# Patient Record
Sex: Female | Born: 1955 | Race: Black or African American | Hispanic: No | Marital: Single | Smoking: Former smoker
Health system: Southern US, Community
[De-identification: ages and names within clinical notes are randomized; demographics above are authoritative.]

## PROBLEM LIST (undated history)

## (undated) DIAGNOSIS — K635 Polyp of colon: Secondary | ICD-10-CM

## (undated) DIAGNOSIS — R112 Nausea with vomiting, unspecified: Secondary | ICD-10-CM

## (undated) DIAGNOSIS — T4145XA Adverse effect of unspecified anesthetic, initial encounter: Secondary | ICD-10-CM

## (undated) DIAGNOSIS — T8859XA Other complications of anesthesia, initial encounter: Secondary | ICD-10-CM

## (undated) DIAGNOSIS — D509 Iron deficiency anemia, unspecified: Secondary | ICD-10-CM

## (undated) DIAGNOSIS — D259 Leiomyoma of uterus, unspecified: Secondary | ICD-10-CM

## (undated) DIAGNOSIS — I10 Essential (primary) hypertension: Secondary | ICD-10-CM

## (undated) DIAGNOSIS — Z9889 Other specified postprocedural states: Secondary | ICD-10-CM

## (undated) HISTORY — DX: Iron deficiency anemia, unspecified: D50.9

## (undated) HISTORY — PX: TUBAL LIGATION: SHX77

## (undated) HISTORY — DX: Polyp of colon: K63.5

## (undated) HISTORY — PX: ABDOMINAL HYSTERECTOMY: SHX81

## (undated) HISTORY — DX: Leiomyoma of uterus, unspecified: D25.9

---

## 2004-09-24 ENCOUNTER — Ambulatory Visit: Payer: Self-pay | Admitting: Family Medicine

## 2004-10-05 ENCOUNTER — Ambulatory Visit: Payer: Self-pay | Admitting: Family Medicine

## 2004-10-19 ENCOUNTER — Ambulatory Visit: Payer: Self-pay | Admitting: Family Medicine

## 2004-11-09 ENCOUNTER — Other Ambulatory Visit: Admission: RE | Admit: 2004-11-09 | Discharge: 2004-11-09 | Payer: Self-pay | Admitting: Family Medicine

## 2004-11-09 ENCOUNTER — Ambulatory Visit: Payer: Self-pay | Admitting: Family Medicine

## 2005-01-08 ENCOUNTER — Ambulatory Visit: Payer: Self-pay | Admitting: Family Medicine

## 2005-03-08 ENCOUNTER — Ambulatory Visit: Payer: Self-pay | Admitting: Family Medicine

## 2005-05-01 HISTORY — PX: CARDIAC CATHETERIZATION: SHX172

## 2005-05-20 ENCOUNTER — Other Ambulatory Visit: Payer: Self-pay

## 2005-05-20 ENCOUNTER — Observation Stay: Payer: Self-pay | Admitting: Internal Medicine

## 2006-01-16 ENCOUNTER — Ambulatory Visit: Payer: Self-pay | Admitting: Family Medicine

## 2006-03-21 ENCOUNTER — Encounter: Payer: Self-pay | Admitting: Family Medicine

## 2006-03-21 ENCOUNTER — Other Ambulatory Visit: Admission: RE | Admit: 2006-03-21 | Discharge: 2006-03-21 | Payer: Self-pay | Admitting: Family Medicine

## 2006-03-21 ENCOUNTER — Ambulatory Visit: Payer: Self-pay | Admitting: Family Medicine

## 2006-05-01 HISTORY — PX: NASAL SINUS SURGERY: SHX719

## 2007-02-13 ENCOUNTER — Encounter: Payer: Self-pay | Admitting: Family Medicine

## 2007-02-13 DIAGNOSIS — J309 Allergic rhinitis, unspecified: Secondary | ICD-10-CM | POA: Insufficient documentation

## 2007-02-13 DIAGNOSIS — J45909 Unspecified asthma, uncomplicated: Secondary | ICD-10-CM | POA: Insufficient documentation

## 2007-02-13 DIAGNOSIS — I10 Essential (primary) hypertension: Secondary | ICD-10-CM | POA: Insufficient documentation

## 2007-03-23 ENCOUNTER — Encounter: Payer: Self-pay | Admitting: Family Medicine

## 2007-03-23 ENCOUNTER — Ambulatory Visit: Payer: Self-pay | Admitting: Family Medicine

## 2007-03-23 ENCOUNTER — Other Ambulatory Visit: Admission: RE | Admit: 2007-03-23 | Discharge: 2007-03-23 | Payer: Self-pay | Admitting: Family Medicine

## 2007-03-25 ENCOUNTER — Encounter: Payer: Self-pay | Admitting: Family Medicine

## 2007-03-25 LAB — CONVERTED CEMR LAB
Bilirubin, Direct: 0.1 mg/dL (ref 0.0–0.3)
CO2: 28 meq/L (ref 19–32)
Cholesterol: 188 mg/dL (ref 0–200)
Creatinine, Ser: 0.7 mg/dL (ref 0.4–1.2)
Glucose, Bld: 85 mg/dL (ref 70–99)
HCT: 29.3 % — ABNORMAL LOW (ref 36.0–46.0)
LDL Cholesterol: 130 mg/dL — ABNORMAL HIGH (ref 0–99)
MCHC: 31.4 g/dL (ref 30.0–36.0)
Monocytes Relative: 2.4 % — ABNORMAL LOW (ref 3.0–11.0)
Neutrophils Relative %: 58.5 % (ref 43.0–77.0)
Platelets: 306 10*3/uL (ref 150–400)
Potassium: 4.1 meq/L (ref 3.5–5.1)
RBC: 4.04 M/uL (ref 3.87–5.11)
RDW: 18.8 % — ABNORMAL HIGH (ref 11.5–14.6)
Sodium: 140 meq/L (ref 135–145)
TSH: 1.16 microintl units/mL (ref 0.35–5.50)
Total Bilirubin: 0.5 mg/dL (ref 0.3–1.2)
Total CHOL/HDL Ratio: 5.5
Total Protein: 6.9 g/dL (ref 6.0–8.3)

## 2007-03-26 ENCOUNTER — Encounter (INDEPENDENT_AMBULATORY_CARE_PROVIDER_SITE_OTHER): Payer: Self-pay | Admitting: *Deleted

## 2007-03-26 LAB — CONVERTED CEMR LAB: Pap Smear: NORMAL

## 2007-03-27 ENCOUNTER — Encounter (INDEPENDENT_AMBULATORY_CARE_PROVIDER_SITE_OTHER): Payer: Self-pay | Admitting: *Deleted

## 2007-03-31 DIAGNOSIS — D259 Leiomyoma of uterus, unspecified: Secondary | ICD-10-CM | POA: Insufficient documentation

## 2007-04-15 ENCOUNTER — Encounter: Payer: Self-pay | Admitting: Family Medicine

## 2007-04-30 ENCOUNTER — Ambulatory Visit: Payer: Self-pay | Admitting: Family Medicine

## 2007-04-30 ENCOUNTER — Encounter: Payer: Self-pay | Admitting: Family Medicine

## 2007-05-05 ENCOUNTER — Encounter (INDEPENDENT_AMBULATORY_CARE_PROVIDER_SITE_OTHER): Payer: Self-pay | Admitting: *Deleted

## 2007-06-01 HISTORY — PX: DILATION AND CURETTAGE OF UTERUS: SHX78

## 2007-06-15 ENCOUNTER — Ambulatory Visit: Payer: Self-pay | Admitting: Unknown Physician Specialty

## 2007-06-30 ENCOUNTER — Ambulatory Visit: Payer: Self-pay | Admitting: Unknown Physician Specialty

## 2007-06-30 ENCOUNTER — Encounter: Payer: Self-pay | Admitting: Family Medicine

## 2008-03-29 ENCOUNTER — Ambulatory Visit: Payer: Self-pay | Admitting: Family Medicine

## 2008-03-31 LAB — CONVERTED CEMR LAB
AST: 17 units/L (ref 0–37)
Albumin: 3.8 g/dL (ref 3.5–5.2)
BUN: 9 mg/dL (ref 6–23)
Basophils Relative: 0.5 % (ref 0.0–3.0)
Chloride: 102 meq/L (ref 96–112)
Creatinine, Ser: 0.7 mg/dL (ref 0.4–1.2)
Direct LDL: 155 mg/dL
Eosinophils Absolute: 0.4 10*3/uL (ref 0.0–0.7)
Eosinophils Relative: 4.6 % (ref 0.0–5.0)
GFR calc non Af Amer: 95 mL/min
Glucose, Bld: 90 mg/dL (ref 70–99)
HCT: 38.8 % (ref 36.0–46.0)
MCV: 88.1 fL (ref 78.0–100.0)
Monocytes Absolute: 0.5 10*3/uL (ref 0.1–1.0)
Monocytes Relative: 6.8 % (ref 3.0–12.0)
Neutrophils Relative %: 56 % (ref 43.0–77.0)
RBC: 4.4 M/uL (ref 3.87–5.11)
Total CHOL/HDL Ratio: 6.7
Total Protein: 6.8 g/dL (ref 6.0–8.3)
WBC: 8 10*3/uL (ref 4.5–10.5)

## 2008-05-05 ENCOUNTER — Ambulatory Visit: Payer: Self-pay | Admitting: Family Medicine

## 2008-05-05 ENCOUNTER — Encounter: Payer: Self-pay | Admitting: Family Medicine

## 2008-05-23 ENCOUNTER — Ambulatory Visit: Payer: Self-pay | Admitting: Family Medicine

## 2008-05-23 ENCOUNTER — Encounter: Payer: Self-pay | Admitting: Family Medicine

## 2008-06-10 ENCOUNTER — Ambulatory Visit: Payer: Self-pay | Admitting: Family Medicine

## 2008-07-15 ENCOUNTER — Encounter: Payer: Self-pay | Admitting: Family Medicine

## 2008-08-29 DIAGNOSIS — K635 Polyp of colon: Secondary | ICD-10-CM

## 2008-08-29 HISTORY — DX: Polyp of colon: K63.5

## 2008-09-05 ENCOUNTER — Ambulatory Visit: Payer: Self-pay | Admitting: Gastroenterology

## 2008-09-05 ENCOUNTER — Encounter: Payer: Self-pay | Admitting: Family Medicine

## 2009-06-21 ENCOUNTER — Other Ambulatory Visit: Admission: RE | Admit: 2009-06-21 | Discharge: 2009-06-21 | Payer: Self-pay | Admitting: Family Medicine

## 2009-06-21 ENCOUNTER — Ambulatory Visit: Payer: Self-pay | Admitting: Family Medicine

## 2009-06-21 DIAGNOSIS — E785 Hyperlipidemia, unspecified: Secondary | ICD-10-CM | POA: Insufficient documentation

## 2009-06-26 LAB — CONVERTED CEMR LAB
ALT: 15 units/L (ref 0–35)
AST: 15 units/L (ref 0–37)
Albumin: 3.8 g/dL (ref 3.5–5.2)
Alkaline Phosphatase: 63 units/L (ref 39–117)
BUN: 11 mg/dL (ref 6–23)
Basophils Relative: 2 % (ref 0.0–3.0)
Chloride: 105 meq/L (ref 96–112)
Cholesterol: 194 mg/dL (ref 0–200)
Eosinophils Relative: 2 % (ref 0.0–5.0)
GFR calc non Af Amer: 113.4 mL/min (ref >60)
HCT: 39.6 % (ref 36.0–46.0)
Hemoglobin: 13.2 g/dL (ref 12.0–15.0)
LDL Cholesterol: 125 mg/dL — ABNORMAL HIGH (ref 0–99)
Lymphs Abs: 2.7 10*3/uL (ref 0.7–4.0)
MCV: 90.8 fL (ref 78.0–100.0)
Monocytes Absolute: 2.1 10*3/uL — ABNORMAL HIGH (ref 0.1–1.0)
Monocytes Relative: 11 % (ref 3.0–12.0)
Neutro Abs: 3.7 10*3/uL (ref 1.4–7.7)
Potassium: 3.8 meq/L (ref 3.5–5.1)
RBC: 4.36 M/uL (ref 3.87–5.11)
Sodium: 138 meq/L (ref 135–145)
TSH: 1.44 microintl units/mL (ref 0.35–5.50)
Total CHOL/HDL Ratio: 6
VLDL: 37 mg/dL (ref 0.0–40.0)
WBC: 8.9 10*3/uL (ref 4.5–10.5)

## 2009-07-03 ENCOUNTER — Encounter (INDEPENDENT_AMBULATORY_CARE_PROVIDER_SITE_OTHER): Payer: Self-pay | Admitting: *Deleted

## 2009-07-03 LAB — CONVERTED CEMR LAB: Pap Smear: NORMAL

## 2009-07-06 ENCOUNTER — Ambulatory Visit: Payer: Self-pay | Admitting: Family Medicine

## 2009-07-06 ENCOUNTER — Encounter: Payer: Self-pay | Admitting: Family Medicine

## 2009-07-17 ENCOUNTER — Encounter (INDEPENDENT_AMBULATORY_CARE_PROVIDER_SITE_OTHER): Payer: Self-pay | Admitting: *Deleted

## 2009-07-17 LAB — HM MAMMOGRAPHY: HM Mammogram: NORMAL

## 2010-06-06 ENCOUNTER — Emergency Department: Payer: Self-pay | Admitting: Emergency Medicine

## 2010-06-18 ENCOUNTER — Telehealth (INDEPENDENT_AMBULATORY_CARE_PROVIDER_SITE_OTHER): Payer: Self-pay | Admitting: *Deleted

## 2010-06-19 ENCOUNTER — Ambulatory Visit: Payer: Self-pay | Admitting: Family Medicine

## 2010-06-19 ENCOUNTER — Encounter (INDEPENDENT_AMBULATORY_CARE_PROVIDER_SITE_OTHER): Payer: Self-pay | Admitting: *Deleted

## 2010-06-26 ENCOUNTER — Other Ambulatory Visit
Admission: RE | Admit: 2010-06-26 | Discharge: 2010-06-26 | Payer: Self-pay | Source: Home / Self Care | Admitting: Family Medicine

## 2010-06-26 ENCOUNTER — Ambulatory Visit: Payer: Self-pay | Admitting: Family Medicine

## 2010-06-26 DIAGNOSIS — N898 Other specified noninflammatory disorders of vagina: Secondary | ICD-10-CM | POA: Insufficient documentation

## 2010-06-26 LAB — CONVERTED CEMR LAB
KOH Prep: NEGATIVE
Pap Smear: NORMAL

## 2010-07-03 ENCOUNTER — Encounter: Payer: Self-pay | Admitting: Family Medicine

## 2010-07-04 LAB — CONVERTED CEMR LAB
ALT: 15 U/L
AST: 15 U/L
Albumin: 3.7 g/dL
Alkaline Phosphatase: 60 U/L
BUN: 12 mg/dL
Basophils Absolute: 0 10*3/uL
Basophils Relative: 0.7 %
Bilirubin, Direct: 0 mg/dL
CO2: 29 meq/L
Calcium: 9.5 mg/dL
Chloride: 104 meq/L
Cholesterol: 190 mg/dL
Creatinine, Ser: 0.7 mg/dL
Eosinophils Absolute: 0.3 10*3/uL
Eosinophils Relative: 4.5 %
GFR calc non Af Amer: 112.95 mL/min
Glucose, Bld: 102 mg/dL — ABNORMAL HIGH
HCT: 40.8 %
HDL: 31.5 mg/dL — ABNORMAL LOW
Hemoglobin: 13.6 g/dL
LDL Cholesterol: 128 mg/dL — ABNORMAL HIGH
Lymphocytes Relative: 29.9 %
Lymphs Abs: 2.2 10*3/uL
MCHC: 33.3 g/dL
MCV: 89.6 fL
Monocytes Absolute: 0.4 10*3/uL
Monocytes Relative: 5.6 %
Neutro Abs: 4.3 10*3/uL
Neutrophils Relative %: 59.3 %
Platelets: 253 10*3/uL
Potassium: 4.5 meq/L
RBC: 4.55 M/uL
RDW: 12.9 %
Sodium: 139 meq/L
TSH: 1.2 u[IU]/mL
Total Bilirubin: 0.4 mg/dL
Total CHOL/HDL Ratio: 6
Total Protein: 6.6 g/dL
Triglycerides: 151 mg/dL — ABNORMAL HIGH
VLDL: 30.2 mg/dL
WBC: 7.3 10*3/uL

## 2010-07-17 ENCOUNTER — Encounter: Payer: Self-pay | Admitting: Family Medicine

## 2010-07-17 ENCOUNTER — Ambulatory Visit: Payer: Self-pay | Admitting: Family Medicine

## 2010-07-23 ENCOUNTER — Encounter: Payer: Self-pay | Admitting: Family Medicine

## 2010-07-25 ENCOUNTER — Encounter: Payer: Self-pay | Admitting: Family Medicine

## 2010-07-25 ENCOUNTER — Ambulatory Visit: Payer: Self-pay | Admitting: Family Medicine

## 2010-07-30 DIAGNOSIS — N63 Unspecified lump in unspecified breast: Secondary | ICD-10-CM | POA: Insufficient documentation

## 2010-07-31 NOTE — Miscellaneous (Signed)
Summary: pap results  Clinical Lists Changes  Observations: Added new observation of PAP SMEAR: normal (07/03/2009 8:18)      Preventive Care Screening  Pap Smear:    Date:  07/03/2009    Results:  normal

## 2010-07-31 NOTE — Miscellaneous (Signed)
Summary: mammo results  Clinical Lists Changes  Observations: Added new observation of MAMMO DUE: 08/2010 (07/17/2009 8:19) Added new observation of MAMMOGRAM: normal (07/17/2009 8:19)      Preventive Care Screening  Mammogram:    Date:  07/17/2009    Next Due:  08/2010    Results:  normal

## 2010-08-02 NOTE — Letter (Signed)
Summary: Results Follow up Letter  [place] at [facility]  [address] [place], [place] [address]   Phone: [phone]  Fax: [phone]    07/03/2010 MRN: [id number]  [facility] [address] [place], [place]  [address]  Dear Ms. Wadle,  The following are the results of your recent test(s):  Test         Result    Pap Smear:        Normal __X___  Not Normal _____ Comments: ______________________________________________________ Cholesterol: LDL(Bad cholesterol):         Your goal is less than:         HDL (Good cholesterol):       Your goal is more than: Comments:  ______________________________________________________ Mammogram:        Normal _____  Not Normal _____ Comments:  ___________________________________________________________________ Hemoccult:        Normal _____  Not normal _______ Comments:    _____________________________________________________________________ Other Tests:    We routinely do not discuss normal results over the telephone.  If you desire a copy of the results, or you have any questions about this information we can discuss them at your next office visit.   Sincerely,     Dr. Roxy Manns

## 2010-08-02 NOTE — Letter (Signed)
Summary: [place] No Show Letter  [place] at [facility]  [address] [place], [place] [address]   Phone: [phone]  Fax: [phone]    06/19/2010 MRN: [id number]  [facility] [address] [place], [place]  [address]   Dear Ms. Postlethwait,   Our records indicate that you missed your scheduled appointment with ___lab__________________ on _12.20.2011___________.  Please contact this office to reschedule your appointment as soon as possible.  It is important that you keep your scheduled appointments with your physician, so we can provide you the best care possible.  Please be advised that there may be a charge for "no show" appointments.    Sincerely,   [place] at [facility]

## 2010-08-02 NOTE — Assessment & Plan Note (Signed)
Summary: CPX/DLO   Vital Signs:  Patient profile:   55 year old female Height:      67.75 inches Weight:      196.25 pounds BMI:     30.17 O2 Sat:      96 % on Room air Temp:     98.3 degrees F oral Pulse rate:   76 / minute Pulse rhythm:   regular BP sitting:   132 / 84  (left arm) Cuff size:   regular  Vitals Entered By: Lewanda Rife LPN (June 26, 2010 9:30 AM)  O2 Flow:  Room air CC: CPX LMP 06/05/10   History of Present Illness: here for health mt exam and to review chronic med problems  feels ok today  had to go to hospital 2-3 wk ago -- had syncope  nl head scan and work up -- all was nl - blood work too  thought a bad sinus infection caused it  was her on abx -- ? augmentin   w tis up 2 lb with bmi of 30   had endom ablation 08 for fibroids  last pap 1/11 was normal  menses  -- still has that -- is really light - one pad last her all day  still comes in cycles  is not that bothersome -- and knows menopause is imminent  wants to give it another year  no pain or pressure  also discharge with odor which is bothersome -- no itching   lipids due- last LDL high at 150 has been working on diet for cholesterol -- had some labs at work- was improved  has cut out most of the fried foods   mam nl 1/11 self exam- no lumps or changes wants to set that up   colonosc 3/10 - adenom polyps -- 5 year f/u   Td 07  does not get flu shot - allergic to it   Contraindications/Deferment of Procedures/Staging:    Treatment: Flu Shot    Contraindication: allergy   Allergies (verified): No Known Drug Allergies  Past History:  Past Medical History: Last updated: 03/29/2008 Allergic rhinitis Anemia-iron deficiency Asthma uterine fibroids- with heavy menses and anemia (resolved after ablation)  Past Surgical History: Last updated: 09/11/2008 Sinus surgery Tubal ligation Cardiac cath- mild CAD (05/2005) sinus sx 11/07 pelvic US 9/08 uterine fibroids and  ovarian cysts 12/08 D and C, hysteroscopy and endometrial ablation (3/10 ) polyps  Family History: Last updated: 2007-04-02 Father died of cirrhosis, had htn Mother: HTN, Siblings:  gm cva, MI, DM gf lung ca brother MI at 75, and 77  Social History: Last updated: 03/29/2008 Marital Status: single Engineer, drilling  Occupation: [employer] no alcohol  never smoked treadmill for exercise   Risk Factors: Smoking Status: quit (02/13/2007)  Review of Systems General:  Denies fatigue, loss of appetite, and malaise. Eyes:  Denies blurring and eye irritation. CV:  Denies chest pain or discomfort, lightheadness, palpitations, and shortness of breath with exertion. Resp:  Denies cough and wheezing. GI:  Denies abdominal pain, indigestion, nausea, and vomiting. GU:  Complains of discharge; denies dysuria and urinary frequency. MS:  Denies muscle aches and cramps. Derm:  Denies itching, lesion(s), poor wound healing, and rash. Neuro:  Denies numbness and tingling. Psych:  Denies anxiety and depression. Endo:  Denies cold intolerance, excessive thirst, excessive urination, and heat intolerance. Heme:  Denies abnormal bruising and bleeding.  Physical Exam  General:  overweight but generally well appearing  Head:  normocephalic, atraumatic, and no  abnormalities observed.   Eyes:  vision grossly intact, pupils equal, pupils round, and pupils reactive to light.  no conjunctival pallor, injection or icterus  Ears:  R ear normal and L ear normal.   Nose:  no nasal discharge.   Mouth:  pharynx pink and moist.   Neck:  supple with full rom and no masses or thyromegally, no JVD or carotid bruit  Chest Wall:  No deformities, masses, or tenderness noted. Breasts:  No mass, nodules, thickening, tenderness, bulging, retraction, inflamation, nipple discharge or skin changes noted.   Lungs:  Normal respiratory effort, chest expands symmetrically. Lungs are clear to auscultation, no crackles or  wheezes. Heart:  Normal rate and regular rhythm. S1 and S2 normal without gallop, murmur, click, rub or other extra sounds. Abdomen:  Bowel sounds positive,abdomen soft and non-tender without masses, organomegaly or hernias noted.  no renal bruits  no suprapubic tenderness or fullness felt  Genitalia:  some thin brown d/c noted- no odor  normal introitus, no external lesions, mucosa pink and moist, no vaginal or cervical lesions, no vaginal atrophy, and no friaility or hemorrhage.  fibroids noted  Msk:  No deformity or scoliosis noted of thoracic or lumbar spine.  no acute joint changes Pulses:  R and L carotid,radial,femoral,dorsalis pedis and posterior tibial pulses are full and equal bilaterally Extremities:  No clubbing, cyanosis, edema, or deformity noted with normal full range of motion of all joints.   Neurologic:  sensation intact to light touch, gait normal, and DTRs symmetrical and normal.   Skin:  Intact without suspicious lesions or rashes Cervical Nodes:  No lymphadenopathy noted Axillary Nodes:  No palpable lymphadenopathy Inguinal Nodes:  No significant adenopathy Psych:  normal affect, talkative and pleasant    Impression & Recommendations:  Problem # 1:  HEALTH MAINTENANCE EXAM (ICD-V70.0) Assessment Comment Only  reviewed health habits including diet, exercise and skin cancer prevention reviewed health maintenance list and family history wellness labs today  Orders: Venipuncture (98119) TLB-Lipid Panel (80061-LIPID) TLB-BMP (Basic Metabolic Panel-BMET) (80048-METABOL) TLB-CBC Platelet - w/Differential (85025-CBCD) TLB-Hepatic/Liver Function Pnl (80076-HEPATIC) TLB-TSH (Thyroid Stimulating Hormone) (84443-TSH)  Problem # 2:  GYNECOLOGICAL EXAMINATION, ROUTINE (ICD-V72.31) Assessment: Comment Only annual exam  fibroids with past ablation -- some intemitttent scant bleeding with d/c waiting on menopause if not imp- may consider gyn f/u  Problem # 3:  OTHER  SCREENING MAMMOGRAM (ICD-V76.12) Assessment: Comment Only annual mammogram scheduled adv pt to continue regular self breast exams non remarkable breast exam today  Orders: Radiology Referral (Radiology)  Problem # 4:  HYPERLIPIDEMIA (ICD-272.4) Assessment: Unchanged  last LDL was in 150s- but per pt better with less fried foods lab today and update disc goals for LDl and HDL  Orders: Venipuncture (14782) TLB-Lipid Panel (80061-LIPID) TLB-BMP (Basic Metabolic Panel-BMET) (80048-METABOL) TLB-CBC Platelet - w/Differential (85025-CBCD) TLB-Hepatic/Liver Function Pnl (80076-HEPATIC) TLB-TSH (Thyroid Stimulating Hormone) (84443-TSH)  Labs Reviewed: SGOT: 15 (06/21/2009)   SGPT: 15 (06/21/2009)   HDL:31.70 (06/21/2009), 29.8 (03/29/2008)  LDL:125 (06/21/2009), DEL (03/29/2008)  Chol:194 (06/21/2009), 201 (03/29/2008)  Trig:185.0 (06/21/2009), 156 (03/29/2008)  Problem # 5:  VAGINAL DISCHARGE (ICD-623.5) Assessment: New  with clue cells on wet prep  will tx with metrogel and update if not improved Her updated medication list for this problem includes:    Metrogel-vaginal 0.75 % Gel (Metronidazole) .Marland Kitchen... 1 applicator intravaginally at bedtime for 10 days  Orders: Prescription Created Electronically [id number])  Complete Medication List: 1)  Flonase 50 Mcg/act Susp (Fluticasone propionate) .... 2 sprays in each nostril  once daily as needed 2)  Metrogel-vaginal 0.75 % Gel (Metronidazole) .Marland Kitchen.. 1 applicator intravaginally at bedtime for 10 days  Patient Instructions: 1)  labs today 2)  you can raise your HDL (good cholesterol) by increasing exercise and eating omega 3 fatty acid supplement like fish oil or flax seed oil over the counter 3)  you can lower LDL (bad cholesterol) by limiting saturated fats in diet like red meat, fried foods, egg yolks, fatty breakfast meats, high fat dairy products and shellfish 4)  we will schedule mammogram at check out  5)  I think you have bacterial  vaginosis causing the discharge  6)  please use the metrogel vaginal medicine as directed  Prescriptions: METROGEL-VAGINAL 0.75 % GEL (METRONIDAZOLE) 1 applicator intravaginally at bedtime for 10 days  #10 days x 0   Entered and Authorized by:   Judith Part MD   Signed by:   Judith Part MD on 06/26/2010   Method used:   Electronically to        [facility]. [address] [phone]* (retail)       [address] [place], [place]  [id number]       Ph: [phone]       Fax: [phone]   RxID:   [id number]    Orders Added: 1)  Venipuncture [25956] 2)  TLB-Lipid Panel [80061-LIPID] 3)  TLB-BMP (Basic Metabolic Panel-BMET) [80048-METABOL] 4)  TLB-CBC Platelet - w/Differential [85025-CBCD] 5)  TLB-Hepatic/Liver Function Pnl [80076-HEPATIC] 6)  TLB-TSH (Thyroid Stimulating Hormone) [84443-TSH] 7)  Radiology Referral [Radiology] 8)  Prescription Created Electronically [G8553] 9)  Est. Patient 40-64 years [99396] 10)  Est. Patient Level II [38756]    Current Allergies (reviewed today): No known allergies     Laboratory Results    Wet [place]/KOH Source: vaginal WBC/hpf 1-5 Bacteria/hpf 1+  Rods Clue cells/hpf moderate  Negative whiff Yeast/hpf none KOH Negative Trichomonas/hpf none

## 2010-08-02 NOTE — Progress Notes (Signed)
----   Converted from flag ---- ---- 06/18/2010 1:31 PM, Judith Part MD wrote: please check lipid and wellness v70.0 thanks  ---- 06/18/2010 10:38 AM, Liane Comber CMA (AAMA) wrote: Lab orders please! Good Morning! This pt is scheduled for cpx labs Tuesday, which labs to draw and dx codes to use? Thanks Tasha ------------------------------

## 2010-11-05 ENCOUNTER — Telehealth: Payer: Self-pay | Admitting: *Deleted

## 2010-11-05 NOTE — Telephone Encounter (Signed)
If she is currently having cp-needs to go to ER or [facility] for eval  Otherwise schedule with first availible- I'm told all are full today

## 2010-11-05 NOTE — Telephone Encounter (Signed)
Patient notified as instructed by telephone.Pt said she is not having chest pain now and she thinks she is having panic attacks. If continues will call back for appt. Or if chest pain pt will go to ER.

## 2010-11-05 NOTE — Telephone Encounter (Signed)
Patient called c/o heaviness in her chest that has been going on for about 2 weeks. Patient states the chest pain comes and goes and she is having a little chest pain today that comes and goes. Patient states that the other day she was having some left arm pain. Patient states that she has had panic attacks and is wondering if this is what is going on and she is also going thru menopause.  Patient wants to know if you can add her on to your schedule today?

## 2010-11-05 NOTE — Telephone Encounter (Signed)
Left message for patient to return my call.

## 2010-11-29 ENCOUNTER — Other Ambulatory Visit: Payer: Self-pay | Admitting: *Deleted

## 2010-11-29 MED ORDER — FLUTICASONE PROPIONATE 50 MCG/ACT NA SUSP: NASAL | Status: DC

## 2011-01-17 ENCOUNTER — Telehealth: Payer: Self-pay

## 2011-01-17 NOTE — Telephone Encounter (Signed)
Verified with pt she has appt [facility] 01/24/11 at 2:30pm for 6 mth f/u left breast mammogram. Pt is aware of appt.

## 2011-01-24 ENCOUNTER — Ambulatory Visit: Payer: Self-pay | Admitting: Family Medicine

## 2011-01-29 ENCOUNTER — Encounter: Payer: Self-pay | Admitting: Family Medicine

## 2011-02-07 ENCOUNTER — Telehealth: Payer: Self-pay

## 2011-02-07 NOTE — Telephone Encounter (Signed)
Message copied by Patience Musca on Thu Feb 07, 2011  2:25 PM ------      Message from: Roxy Manns A      Created: Thu Jan 31, 2011  6:23 PM       Mam is stable - reassuring       Can resume annual mam in January for screening

## 2011-02-07 NOTE — Telephone Encounter (Signed)
Patient advised.

## 2011-02-07 NOTE — Telephone Encounter (Signed)
Left vm for pt to callback 

## 2011-03-20 ENCOUNTER — Emergency Department: Payer: Self-pay | Admitting: Unknown Physician Specialty

## 2011-07-08 ENCOUNTER — Telehealth: Payer: Self-pay

## 2011-07-08 DIAGNOSIS — Z1231 Encounter for screening mammogram for malignant neoplasm of breast: Secondary | ICD-10-CM

## 2011-07-08 NOTE — Telephone Encounter (Signed)
Pt request referral to [facility] at [facility] for mammogram. Pt will wait to hear from pt care coordinator about appt info.

## 2011-07-08 NOTE — Telephone Encounter (Signed)
Done

## 2011-07-11 ENCOUNTER — Telehealth: Payer: Self-pay | Admitting: Family Medicine

## 2011-07-11 DIAGNOSIS — R922 Inconclusive mammogram: Secondary | ICD-10-CM

## 2011-07-11 NOTE — Telephone Encounter (Signed)
Called [facility] to set up the screening MMG and they said that patients last MMG was a Category 3 MMG so the order needs to be a bilateral diagnostic MMG and the DX - Density. Pls cancel the screening order and put in the Diagnostic order , Thanks.,

## 2011-08-19 ENCOUNTER — Encounter: Payer: Self-pay | Admitting: Family Medicine

## 2011-08-20 ENCOUNTER — Encounter: Payer: Self-pay | Admitting: Family Medicine

## 2011-08-20 ENCOUNTER — Ambulatory Visit (INDEPENDENT_AMBULATORY_CARE_PROVIDER_SITE_OTHER): Payer: BC Managed Care – PPO | Admitting: Family Medicine

## 2011-08-20 VITALS — BP 140/80 | HR 76 | Temp 98.3°F | Ht 67.25 in | Wt 196.8 lb

## 2011-08-20 DIAGNOSIS — R7309 Other abnormal glucose: Secondary | ICD-10-CM

## 2011-08-20 DIAGNOSIS — E785 Hyperlipidemia, unspecified: Secondary | ICD-10-CM

## 2011-08-20 DIAGNOSIS — I10 Essential (primary) hypertension: Secondary | ICD-10-CM

## 2011-08-20 DIAGNOSIS — Z0000 Encounter for general adult medical examination without abnormal findings: Secondary | ICD-10-CM

## 2011-08-20 DIAGNOSIS — E559 Vitamin D deficiency, unspecified: Secondary | ICD-10-CM | POA: Insufficient documentation

## 2011-08-20 DIAGNOSIS — N920 Excessive and frequent menstruation with regular cycle: Secondary | ICD-10-CM

## 2011-08-20 DIAGNOSIS — R739 Hyperglycemia, unspecified: Secondary | ICD-10-CM

## 2011-08-20 MED ORDER — FLUTICASONE PROPIONATE 50 MCG/ACT NA SUSP: NASAL | Status: DC

## 2011-08-20 NOTE — Assessment & Plan Note (Signed)
Will get on 2000 iu daily Was 18 Re check 3 mo and f/u

## 2011-08-20 NOTE — Progress Notes (Signed)
Subjective:    Patient ID: Barbara Kaiser, female    DOB: 1955-09-09, 56 y.o.   MRN: [id number]  HPI Here for health maintenance exam and to review chronic medical problems    Having gyn issues - heavy menses and pain  Put a "flow fan" in to slow her menses  Period just will not stop at 38 Dr Harold Hedge- she retired , and pt tried to get appt over there - could not  Wants to go to [facility]  Pap 12/11 last pap normal   mammo 7/12- - has to go every 6 months - re check is tomorrow Self exam- no lumps or changes    Wt stable at bmi 30   bp 148/80 A little high for her -- thinks it is from pain  No bp meds  Diet- did change - started drinking lots of water/ and eating less fatty foods  Is also eating less sugar  Exercise- when she feels ok , aims at 4 times per week   Gluc 114 last labs in fall Has DM in family   Lipids LDL was 137 then Now eating less fat   Vit D low at 18.8 also  Vit D intake-- is supposed to be taking 1000 iu daily   Patient Active Problem List  Diagnoses  . FIBROIDS, UTERUS  . HYPERLIPIDEMIA  . HYPERTENSION, MILD  . ALLERGIC RHINITIS  . ASTHMA  . VAGINAL DISCHARGE  . BREAST MASS, LEFT  . Other screening mammogram  . Breast density  . Routine general medical examination at a health care facility  . Hyperglycemia  . Vitamin d deficiency  . Menorrhagia   Past Medical History  Diagnosis Date  . Allergic rhinitis   . Anemia, iron deficiency   . Asthma   . Uterine fibroid     with heavy menses and anemia (resoloved by ablation)  . History of pelvic ultrasound 09/08    uterine fibroids and ovarian cysts  . Colon polyps 3/10   Past Surgical History  Procedure Date  . Nasal sinus surgery 11/07  . Tubal ligation   . Cardiac catheterization 05/2005    mild CAD  . Dilation and curettage of uterus 12/08    hysteroscopy and endometrial ablation   History  Substance Use Topics  . Smoking status: Former Smoker    Quit date: 07/01/2001    . Smokeless tobacco: Not on file  . Alcohol Use: No   Family History  Problem Relation Age of Onset  . Hypertension Mother   . Hypertension Father   . Heart disease Other     MI  . Diabetes Other   . Stroke Other   . Heart disease Brother     age 93 and 62  . Cancer Other     lung   No Known Allergies No current outpatient prescriptions on file prior to visit.      Review of Systems Review of Systems  Constitutional: Negative for fever, appetite change, and unexpected weight change.pos for some fatigue   Eyes: Negative for pain and visual disturbance.  Respiratory: Negative for cough and shortness of breath.   Cardiovascular: Negative for cp or palpitations    Gastrointestinal: Negative for nausea, diarrhea and constipation.  Genitourinary: Negative for urgency and frequency. pos for heavy menses  Skin: Negative for pallor or rash   Neurological: Negative for weakness, light-headedness, numbness and headaches.  Hematological: Negative for adenopathy. Does not bruise/bleed easily.  Psychiatric/Behavioral: Negative for dysphoric mood. The  patient is not nervous/anxious.         Objective:   Physical Exam  Constitutional: She appears well-developed and well-nourished. No distress.       overwt and well appearing   HENT:  Head: Normocephalic and atraumatic.  Right Ear: External ear normal.  Left Ear: External ear normal.  Nose: Nose normal.  Mouth/Throat: Oropharynx is clear and moist.  Eyes: Conjunctivae and EOM are normal. Pupils are equal, round, and reactive to light. No scleral icterus.  Neck: Normal range of motion. Neck supple. No JVD present. No thyromegaly present.  Cardiovascular: Normal rate, regular rhythm, normal heart sounds and intact distal pulses.  Exam reveals no gallop.   Pulmonary/Chest: Effort normal and breath sounds normal. No respiratory distress. She has no wheezes. She has no rales.  Abdominal: Soft. Bowel sounds are normal. She exhibits no  distension and no mass. There is tenderness. There is no rebound and no guarding.       Some suprapubic tenderness without mass or rebound   Musculoskeletal: Normal range of motion. She exhibits no edema and no tenderness.  Lymphadenopathy:    She has no cervical adenopathy.  Neurological: She is alert. She has normal reflexes. No cranial nerve deficit or sensory deficit. She exhibits normal muscle tone. Coordination normal.  Skin: Skin is warm and dry. No rash noted. No erythema. No pallor.  Psychiatric: She has a normal mood and affect.          Assessment & Plan:

## 2011-08-20 NOTE — Assessment & Plan Note (Signed)
Better on 2nd check In pain today Re check 3 mo f/u

## 2011-08-20 NOTE — Assessment & Plan Note (Signed)
Reviewed health habits including diet and exercise and skin cancer prevention Also reviewed health mt list, fam hx and immunizations   Rev wellness labs  

## 2011-08-20 NOTE — Patient Instructions (Addendum)
We will do gyn referral at check out  Work on healthy diet and exercise- low sugar and low fat  Avoid red meat/ fried foods/ egg yolks/ fatty breakfast meats/ butter, cheese and high fat dairy/ and shellfish   Schedule fasting lab and follow up in about 3 months  Take 2000 iu of vitamin D (3) over the counter daily

## 2011-08-20 NOTE — Assessment & Plan Note (Signed)
Dr Harold Hedge retired- ref to [facility]  Has ? IUD or something like it  Heavy menses over 50 and some pain  Will also need annual exam

## 2011-08-20 NOTE — Assessment & Plan Note (Signed)
Rev lab from [facility]-- sugar slt high Will watch diet -low glycemic diet disc Lab for a1c and f/u 3 mo

## 2011-08-20 NOTE — Assessment & Plan Note (Signed)
LDL 137 [facility] in fall Disc goals for lipids and reasons to control them Rev labs with pt Rev low sat fat diet in detail  Re check 3 mo and f/u

## 2011-08-21 ENCOUNTER — Ambulatory Visit: Payer: Self-pay | Admitting: Family Medicine

## 2011-08-23 ENCOUNTER — Encounter: Payer: Self-pay | Admitting: Family Medicine

## 2011-08-26 ENCOUNTER — Encounter: Payer: Self-pay | Admitting: Family Medicine

## 2011-08-27 ENCOUNTER — Encounter: Payer: Self-pay | Admitting: Obstetrics & Gynecology

## 2011-08-27 ENCOUNTER — Ambulatory Visit (INDEPENDENT_AMBULATORY_CARE_PROVIDER_SITE_OTHER): Payer: BC Managed Care – PPO | Admitting: Obstetrics & Gynecology

## 2011-08-27 DIAGNOSIS — Z01419 Encounter for gynecological examination (general) (routine) without abnormal findings: Secondary | ICD-10-CM

## 2011-08-27 DIAGNOSIS — D259 Leiomyoma of uterus, unspecified: Secondary | ICD-10-CM

## 2011-08-27 DIAGNOSIS — N920 Excessive and frequent menstruation with regular cycle: Secondary | ICD-10-CM

## 2011-08-27 DIAGNOSIS — R102 Pelvic and perineal pain: Secondary | ICD-10-CM

## 2011-08-27 DIAGNOSIS — Z1272 Encounter for screening for malignant neoplasm of vagina: Secondary | ICD-10-CM

## 2011-08-27 DIAGNOSIS — N949 Unspecified condition associated with female genital organs and menstrual cycle: Secondary | ICD-10-CM

## 2011-08-27 MED ORDER — DICLOFENAC SODIUM 75 MG PO TBEC: 75.0000 mg | DELAYED_RELEASE_TABLET | Freq: Two times a day (BID) | ORAL | Status: AC

## 2011-08-27 NOTE — Progress Notes (Signed)
Addended by: Jaynie Collins A on: 08/27/2011 01:30 PM   Modules accepted: Orders

## 2011-08-27 NOTE — Patient Instructions (Signed)
Fibroids You have been diagnosed as having a fibroid. Fibroids are smooth muscle lumps (tumors) which can occur any place in a woman's body. They are usually in the womb (uterus). The most common problem (symptom) of fibroids is bleeding. Over time this may cause low red blood cells (anemia). Other symptoms include feelings of pressure and pain in the pelvis. The diagnosis (learning what is wrong) of fibroids is made by physical exam. Sometimes tests such as an ultrasound are used. This is helpful when fibroids are felt around the ovaries and to look for tumors. TREATMENT   Most fibroids do not need surgical or medical treatment. Sometimes a tissue sample (biopsy) of the lining of the uterus is done to rule out cancer. If there is no cancer and only a small amount of bleeding, the problem can be watched.   Hormonal treatment can improve the problem.   When surgery is needed, it can consist of removing the fibroid. Vaginal birth may not be possible after the removal of fibroids. This depends on where they are and the extent of surgery. When pregnancy occurs with fibroids it is usually normal.   Your caregiver can help decide which treatments are best for you.  HOME CARE INSTRUCTIONS   Do not use aspirin as this may increase bleeding problems.   If your periods (menses) are heavy, record the number of pads or tampons used per month. Bring this information to your caregiver. This can help them determine the best treatment for you.  SEEK IMMEDIATE MEDICAL CARE IF:  You have pelvic pain or cramps not controlled with medications, or experience a sudden increase in pain.   You have an increase of pelvic bleeding between and during menses.   You feel lightheaded or have fainting spells.   You develop worsening belly (abdominal) pain.  Document Released: 06/14/2000 Document Revised: 02/27/2011 Document Reviewed: 02/04/2008 Exit[employer] Patient Information 2012 [employer]. 

## 2011-08-27 NOTE — Progress Notes (Signed)
  Subjective:     Barbara Kaiser is a 55 y.o. female and is here for a gynecologic exam and evaluation of enlarged fibroid uterus and pelvic pain. The patient reports that she has a history of menorrhagia and fibroids but underwent a Novasure ablation in 2008.  Since then her bleeding was stable with light monthly menses until last year when she started skipping months and having brown discharge for about 7 days every 1-2 months. No intemenstrual bleeding, no increased bleeding, uses maximum of two pads a day. No signs/symptoms of anemia.  She just reports having lower pelvic pain, constant, no relation to bleeding, no GI/GU symptoms.  No problems with sexual activity. Pain is alleviated by NSAIDs. Recently underwent comprehensive exam (except for pelvic and pap)  with her PCP Dr. Roxy Manns.  Last mammogram earlier this month (08/21/11) was negative, patient declines breast exam today.  History   Social History  . Marital Status: Single    Spouse Name: N/A    Number of Children: 2  . Years of Education: N/A   Occupational History  .  [employer]   Social History Main Topics  . Smoking status: Former Smoker    Quit date: 07/01/2001  . Smokeless tobacco: Not on file  . Alcohol Use: No  . Drug Use: No  . Sexually Active: Not Currently -- Female partner(s)    Birth Control/ Protection: Surgical     tubalization.   Other Topics Concern  . Not on file   Social History Narrative   Treadmill for exercise   Health Maintenance  Topic Date Due  . Influenza Vaccine  03/31/2012  . Pap Smear  06/26/2013  . Mammogram  08/20/2013  . Tetanus/tdap  03/21/2016  . Colonoscopy  09/08/2018    The following portions of the patient's history were reviewed and updated as appropriate: allergies, current medications, past family history, past medical history, past social history, past surgical history and problem list.  Review of Systems A comprehensive review of systems was negative.   Objective:      Blood pressure 148/80, pulse 73, height 5\' 7"  (1.702 m), weight 194 lb (87.998 kg), last menstrual period 08/10/2011. GENERAL: Well-developed, well-nourished female in no acute distress.  HEENT: Normocephalic, atraumatic. Sclerae anicteric.  NECK: Supple. Normal thyroid.  LUNGS: Clear to auscultation bilaterally.  HEART: Regular rate and rhythm. BREASTS: Deferred as per patient request ABDOMEN: Soft, non-distended, mild tenderness in the suprapubic/lower pelvic area.  Enlarged uterus palpated, no other organomegaly. PELVIC: Normal external female genitalia. Vagina is pink and rugated.  Normal discharge. Normal cervix contour. Pap smear obtained. Uterus is enlarged in size and mildly tender to palpation.  No adnexal mass or tenderness.  EXTREMITIES: No cyanosis, clubbing, or edema, 2+ distal pulses.   Assessment:   Patient is here for gynecologic exam and pap, also has enlarged uterus and pelvic pain     Plan:   Follow up pap smear Will obtain pelvic ultrasound for further evaluation of enlarged uterus.  Bleeding pattern consistent with post ablation and perimenopausal status, will continue to monitor. Diclofenac prescribed as needed for pain Patient to follow up after ultrasound to discuss results and further management

## 2011-09-04 ENCOUNTER — Ambulatory Visit (HOSPITAL_COMMUNITY)
Admission: RE | Admit: 2011-09-04 | Discharge: 2011-09-04 | Disposition: A | Discharge status: Home / Self Care | Payer: BC Managed Care – PPO | Source: Ambulatory Visit | Attending: Obstetrics & Gynecology | Admitting: Obstetrics & Gynecology

## 2011-09-04 DIAGNOSIS — D259 Leiomyoma of uterus, unspecified: Secondary | ICD-10-CM

## 2011-09-04 DIAGNOSIS — N949 Unspecified condition associated with female genital organs and menstrual cycle: Secondary | ICD-10-CM | POA: Insufficient documentation

## 2011-09-04 DIAGNOSIS — D251 Intramural leiomyoma of uterus: Secondary | ICD-10-CM | POA: Insufficient documentation

## 2011-09-04 DIAGNOSIS — N852 Hypertrophy of uterus: Secondary | ICD-10-CM | POA: Insufficient documentation

## 2011-09-04 DIAGNOSIS — R102 Pelvic and perineal pain: Secondary | ICD-10-CM

## 2011-09-04 DIAGNOSIS — N938 Other specified abnormal uterine and vaginal bleeding: Secondary | ICD-10-CM | POA: Insufficient documentation

## 2011-09-18 ENCOUNTER — Encounter: Payer: Self-pay | Admitting: Obstetrics & Gynecology

## 2011-09-18 ENCOUNTER — Ambulatory Visit (INDEPENDENT_AMBULATORY_CARE_PROVIDER_SITE_OTHER): Payer: BC Managed Care – PPO | Admitting: Obstetrics & Gynecology

## 2011-09-18 VITALS — BP 135/79 | HR 66 | Ht 67.0 in | Wt 193.0 lb

## 2011-09-18 DIAGNOSIS — N920 Excessive and frequent menstruation with regular cycle: Secondary | ICD-10-CM

## 2011-09-18 DIAGNOSIS — D259 Leiomyoma of uterus, unspecified: Secondary | ICD-10-CM

## 2011-09-18 NOTE — Progress Notes (Signed)
  History:  56 y.o. G2P2 here today for follow up of the evaluation of enlarged fibroid uterus and pelvic pain.  She was seen in clinic on 08/27/11 and ultrasound was ordered.  Also underwent her annual exam, pap smear was normal.  The following portions of the patient's history were reviewed and updated as appropriate: allergies, current medications, past family history, past medical history, past social history, past surgical history and problem list.   Objective:  Physical Exam Blood pressure 135/79, pulse 66, height 5\' 7"  (1.702 m), weight 193 lb (87.544 kg), last menstrual period 09/07/2011. Deferred  Labs and Imaging 08/27/2011 Pap smear Normal  09/04/2011  TRANSABDOMINAL AND TRANSVAGINAL ULTRASOUND OF PELVIS Clinical Data: Vaginal bleeding, enlarged uterus.  Prior D&C   Technique:  Both transabdominal and transvaginal ultrasound examinations of the pelvis were performed. Transabdominal technique was performed for global imaging of the pelvis including uterus, ovaries, adnexal regions, and pelvic cul-de-sac.  Comparison: none   It was necessary to proceed with endovaginal exam following the transabdominal exam to visualize the endometrium and ovaries, not seen transabdominally.  Findings:  Uterus: 9.3 x 9.0 x 8.9 cm.  Anteverted, anteflexed.  Multiple fibroids noted as follows:  Posterior mid uterine body, intramural, with mild anterior displacement upon the endometrial stripe, measuring 6.2 x 5.2 x 4.6 cm  Anterior fundus/mid uterine body, intramural, 3.4 x 3.2 x 2.4 cm  Anterior mid uterine body, intramural, 2.7 x 2.4 x 2.1 cm  Endometrium: 8 mm.  Uniformly thin and echogenic, partly obscured by fibroids.  Right ovary:  2.7 x 1.8 x 0.9 cm.  Normal.  Left ovary: 2.2 x 1.7 x 1.5 cm.  Not well visualized, no focal abnormality identified  Other findings: No free fluid  IMPRESSION: Fibroid uterus as above.    Assessment & Plan:   Patient has a fibroid uterus and still reports significant pain.  It  is helped by the Diclofenac but she desires definitive management with supracervical hysterectomy.  I proposed doing a laparoscopic vs robotic supracervical hysterectomy [facility] [place]) and bilateral salpingo-oophorectomy.  The risks of surgery were discussed in detail with the patient including but not limited to: bleeding which may require transfusion or reoperation; infection which may require antibiotics; injury to bowel, bladder, ureters or other surrounding organs; need for additional procedures including laparotomy; thromboembolic phenomenon, incisional problems and other postoperative/anesthesia complications.  Patient was also advised that she will remain in house for 1-2 days; and expected recovery time after a hysterectomy is 6-8 weeks.  Likelihood of success in alleviating the patient's symptoms was discussed.  Patient opted for laparoscopicsupracervical hysterectomy [facility]) and bilateral salpingo-oophorectomy; she wants this to be done around 12/2011 due to work concerns.  She was told that she will be contacted by our surgical scheduler regarding the time and date of her surgery; routine preoperative instructions of having nothing to eat or drink after midnight on the day prior to surgery and also coming to the hospital 1 1/2 hours prior to her time of surgery were also emphasized.  She was told she may be called for a preoperative appointment about a week prior to surgery and will be given further preoperative instructions at that visit. In the meantime, she will continue Diclofenac as needed; pain precautions were reviewed.

## 2011-09-18 NOTE — Patient Instructions (Signed)
Supracervical Hysterectomy A supracervical hysterectomy is minimally invasive surgery to remove the top part of the uterus, but not the cervix. This surgery can be performed by making a large cut (incision) in the abdomen. It can also be done with a thin, lighted tube (laparoscope) inserted into 3 small incisions in the lower abdomen. Your fallopian tubes and ovaries can be removed (bilateral salpingo-oopherectomy) during this surgery as well. If a supracervical hysterectomy is started and it is not safe to continue, the laparoscopic surgery will be converted to an open abdominal surgery. You will not have menstrual periods or be able to get pregnant after having this surgery. If a bilateral salpingo-oopherectomy was performed before menopause, you will go through a sudden (abrupt) menopause. This can be helped with hormone medicines. Benefits of minimally invasive surgery include:  Less pain.   Less risk of blood loss.   Less risk of infection.   Quicker return to normal activities.   Usually a 1 night stay in the hospital.   Overall patient satisfaction.  LET YOUR CAREGIVER KNOW ABOUT:  Any history of abnormal Pap tests.   Allergies to food or medicine.   Medicines taken, including vitamins, herbs, eyedrops, over-the-counter medicines, and creams.   Use of steroids (by mouth or creams).   Previous problems with anesthetics or numbing medicines.   History of bleeding problems or blood clots.   Previous surgery.   Other health problems, including diabetes and kidney problems.   Any infections or colds you may have developed.   Symptoms of irregular or heavy periods, weight loss, or urinary or bowel changes.  RISKS AND COMPLICATIONS   Bleeding.   Blood clots in the legs or lung.   Infection.   Injury to surrounding organs.   Problems with anesthesia.   Risk of conversion to an open abdominal incision.   Early menopause symptoms (hot flashes, night sweats, insomnia).    Additional surgery later to remove the cervix if you have problems with the cervix.  BEFORE THE PROCEDURE  Ask your caregiver about changing or stopping your regular medicines.   Do not take aspirin or blood thinners (anticoagulants) for 1 week before the surgery, or as told by your caregiver.   Do not eat or drink anything for 8 hours before the surgery, or as told by your caregiver.   Quit smoking if you smoke.   Arrange for a ride home after surgery and for someone to help you at home during recovery.  PROCEDURE   You will be given an antibiotic medicine.   An intravenous (IV) line will be placed in your arm. You will be given medicine to make you sleep (general anesthetic).   A gas (carbon dioxide) will be used to inflate your abdomen. This will allow your surgeon to look inside your abdomen, perform your surgery, and treat any other problems found if necessary.   Three or four small incisions (often less than  inch) will be made in your abdomen. One of these incisions will be made in the area of your belly button (navel). The laparoscope will be inserted into the incision. Your surgeon will look through the laparoscope while doing your procedure.   Other surgical instruments will be inserted through the other incisions.   The uterus will be cut into small pieces and removed through the small incisions.   Your incisions will be closed.  AFTER THE PROCEDURE   The gas will be released from inside your abdomen.   You will be  taken to the recovery area where a nurse will watch and check your progress. Once you are awake, stable, and taking fluids well, without other problems, you will return to your room or be allowed to go home.   There is usually minimal discomfort following the surgery because the incisions are so small.   You will be given pain medicine while you are in the hospital and for when you go home.   Try to have someone with you for the first 3 to 5 days after  you go home.   Follow up with your surgeon in 2 to 4 weeks after surgery to evaluate your progress.  Document Released: 12/04/2007 Document Revised: 06/06/2011 Document Reviewed: 02/01/2011 [facility] Patient Information 2012 [place], [place].  Hysterectomy Information  A hysterectomy is a procedure where your uterus is surgically removed. It will no longer be possible to have menstrual periods or to become pregnant. The tubes and ovaries can be removed (bilateral salpingo-oopherectomy) during this surgery as well.  REASONS FOR A HYSTERECTOMY  Persistent, abnormal bleeding.   Lasting (chronic) pelvic pain or infection.   The lining of the uterus (endometrium) starts growing outside the uterus (endometriosis).   The endometrium starts growing in the muscle of the uterus (adenomyosis).   The uterus falls down into the vagina (pelvic organ prolapse).   Symptomatic uterine fibroids.   Precancerous cells.   Cervical cancer or uterine cancer.  TYPES OF HYSTERECTOMIES  Supracervical hysterectomy. This type removes the top part of the uterus, but not the cervix.   Total hysterectomy. This type removes the uterus and cervix.   Radical hysterectomy. This type removes the uterus, cervix, and the fibrous tissue that holds the uterus in place in the pelvis (parametrium).  WAYS A HYSTERECTOMY CAN BE PERFORMED  Abdominal hysterectomy. A large surgical cut (incision) is made in the abdomen. The uterus is removed through this incision.   Vaginal hysterectomy. An incision is made in the vagina. The uterus is removed through this incision. There are no abdominal incisions.   Conventional laparoscopic hysterectomy. A thin, lighted tube with a camera (laparoscope) is inserted into 3 or 4 small incisions in the abdomen. The uterus is cut into small pieces. The small pieces are removed through the incisions, or they are removed through the vagina.   Laparoscopic assisted vaginal hysterectomy (LAVH).  Three or four small incisions are made in the abdomen. Part of the surgery is performed laparoscopically and part vaginally. The uterus is removed through the vagina.   Robot-assisted laparoscopic hysterectomy. A laparoscope is inserted into 3 or 4 small incisions in the abdomen. A computer-controlled device is used to give the surgeon a 3D image. This allows for more precise movements of surgical instruments. The uterus is cut into small pieces and removed through the incisions or removed through the vagina.  RISKS OF HYSTERECTOMY   Bleeding and risk of blood transfusion. Tell your caregiver if you do not want to receive any blood products.   Blood clots in the legs or lung.   Infection.   Injury to surrounding organs.   Anesthesia problems or side effects.   Conversion to an abdominal hysterectomy.  WHAT TO EXPECT AFTER A HYSTERECTOMY  You will be given pain medicine.   You will need to have someone with you for the first 3 to 5 days after you go home.   You will need to follow up with your surgeon in 2 to 4 weeks after surgery to evaluate your progress.  You may have early menopause symptoms like hot flashes, night sweats, and insomnia.   If you had a hysterectomy for a problem that was not a cancer or a condition that could lead to cancer, then you no longer need Pap tests. However, even if you no longer need a Pap test, a regular exam is a good idea to make sure no other problems are starting.  Document Released: 12/11/2000 Document Revised: 06/06/2011 Document Reviewed: 01/26/2011 [facility] Patient Information 2012 [place], [place].

## 2011-11-12 ENCOUNTER — Other Ambulatory Visit: Payer: BC Managed Care – PPO

## 2011-11-19 ENCOUNTER — Ambulatory Visit: Payer: BC Managed Care – PPO | Admitting: Family Medicine

## 2011-11-26 ENCOUNTER — Encounter (HOSPITAL_COMMUNITY): Payer: Self-pay

## 2011-12-02 ENCOUNTER — Encounter (HOSPITAL_COMMUNITY): Payer: Self-pay

## 2011-12-02 ENCOUNTER — Encounter (HOSPITAL_COMMUNITY)
Admission: RE | Admit: 2011-12-02 | Discharge: 2011-12-02 | Disposition: A | Discharge status: Home / Self Care | Payer: BC Managed Care – PPO | Source: Ambulatory Visit | Attending: Obstetrics & Gynecology | Admitting: Obstetrics & Gynecology

## 2011-12-02 HISTORY — DX: Other complications of anesthesia, initial encounter: T88.59XA

## 2011-12-02 HISTORY — DX: Adverse effect of unspecified anesthetic, initial encounter: T41.45XA

## 2011-12-02 LAB — CBC
Hemoglobin: 12.8 g/dL (ref 12.0–15.0)
RBC: 4.47 MIL/uL (ref 3.87–5.11)

## 2011-12-02 LAB — SURGICAL PCR SCREEN
MRSA, PCR: NEGATIVE
Staphylococcus aureus: POSITIVE — AB

## 2011-12-02 NOTE — Pre-Procedure Instructions (Signed)
EKG reviewed and accepted by Dr. Malen Gauze. Pt states history of Card. Cath "maybe last yr", requested from [facility].

## 2011-12-02 NOTE — Patient Instructions (Addendum)
20 Mercie Dittmar  12/02/2011   Your procedure is scheduled on:  12/09/11  Enter through the Main Entrance of [facility] at 800 AM.  Pick up the phone at the desk and dial [phone].   Call this number if you have problems the morning of surgery: [phone]   Remember:   Do not eat food:After Midnight.  Do not drink clear liquids: After Midnight.  Take these medicines the morning of surgery with A SIP OF WATER: NA   Do not wear jewelry, make-up or nail polish.  Do not wear lotions, powders, or perfumes. You may wear deodorant.  Do not shave 48 hours prior to surgery.  Do not bring valuables to the hospital.  Contacts, dentures or bridgework may not be worn into surgery.  Leave suitcase in the car. After surgery it may be brought to your room.  For patients admitted to the hospital, checkout time is 11:00 AM the day of discharge.   Patients discharged the day of surgery will not be allowed to drive home.  Name and phone number of your driver: NA  Special Instructions: CHG Shower Use Special Wash: 1/2 bottle night before surgery and 1/2 bottle morning of surgery.   Please read over the following fact sheets that you were given: MRSA Information

## 2011-12-03 ENCOUNTER — Telehealth: Payer: Self-pay | Admitting: *Deleted

## 2011-12-03 DIAGNOSIS — R9431 Abnormal electrocardiogram [ECG] [EKG]: Secondary | ICD-10-CM

## 2011-12-03 DIAGNOSIS — R9401 Abnormal electroencephalogram [EEG]: Secondary | ICD-10-CM

## 2011-12-03 NOTE — Telephone Encounter (Signed)
Patient went for pre op and it is recommended by anesthesia that she have cardiac clearance prior to her surgery on June 10.

## 2011-12-03 NOTE — Pre-Procedure Instructions (Signed)
Cardiac information rec'd from [facility] and reviewed by Dr. Malen Gauze. Cardiac clearance required prior to any surgery. Call to [facility], Ob-Gyn office, she will speak with Dr. Lovett Sox and give her the message.

## 2011-12-05 ENCOUNTER — Encounter: Payer: Self-pay | Admitting: Cardiovascular Disease

## 2011-12-05 ENCOUNTER — Ambulatory Visit (INDEPENDENT_AMBULATORY_CARE_PROVIDER_SITE_OTHER): Payer: BC Managed Care – PPO | Admitting: Cardiovascular Disease

## 2011-12-05 VITALS — BP 140/90 | HR 76 | Ht 69.0 in | Wt 197.8 lb

## 2011-12-05 DIAGNOSIS — I10 Essential (primary) hypertension: Secondary | ICD-10-CM

## 2011-12-05 DIAGNOSIS — I2510 Atherosclerotic heart disease of native coronary artery without angina pectoris: Secondary | ICD-10-CM

## 2011-12-05 DIAGNOSIS — Z01810 Encounter for preprocedural cardiovascular examination: Secondary | ICD-10-CM | POA: Insufficient documentation

## 2011-12-05 DIAGNOSIS — E785 Hyperlipidemia, unspecified: Secondary | ICD-10-CM

## 2011-12-05 MED ORDER — ATORVASTATIN CALCIUM 10 MG PO TABS: 10.0000 mg | ORAL_TABLET | Freq: Every day | ORAL | Status: DC

## 2011-12-05 NOTE — Assessment & Plan Note (Signed)
She'll be acceptable risk for upcoming hysterectomy surgery next week at [facility] in [place]. No further testing is needed. She has excellent exercise tolerance. No premedication will be started as heart rate is borderline low.

## 2011-12-05 NOTE — Assessment & Plan Note (Signed)
History of mild to moderate coronary artery disease by catheterization in 2006 at [facility]. We have strongly recommended aggressive lipid management. This will be initiated today.

## 2011-12-05 NOTE — Assessment & Plan Note (Signed)
We will start atorvastatin 10 mg daily and have recommended she followup with Dr. Milinda Antis for cholesterol check in the fall. Goal LDL less than 70.

## 2011-12-05 NOTE — Progress Notes (Signed)
Patient ID: Barbara Kaiser, female    DOB: [birth year], 56 y.o.   MRN: [id number]  HPI Comments: [name] is a very pleasant 56 year old woman with history of coronary artery disease by cardiac catheterization in 2006, 40 and 50% sequential lesions in the left circumflex, remote history of smoking several cigarettes per day for 24 years who stopped over 10 years ago, who presents for preoperative evaluation prior to hysterectomy next week.   She reports that she feels well, has no symptoms of shortness of breath or chest pain. She has good exercise tolerance, is able to do a high level of work (higher than 6 METS) on a regular basis with no symptoms. She was recently seen by anesthesia in preoperative evaluation/risk stratification was recommended.   EKG from December 2011 shows normal sinus rhythm with rate 58 beats per minute EKG in 2012 and 2013 is unchanged  No significant history in her mother and father for underlying coronary disease. Brother had MI x2 in his 29s    Outpatient Encounter Prescriptions as of 12/05/2011  Medication Sig Dispense Refill  . acetaminophen (TYLENOL) 500 MG tablet Take 500 mg by mouth every 6 (six) hours as needed.      Marland Kitchen aspirin 81 MG tablet Take 81 mg by mouth as needed.      . cholecalciferol (VITAMIN D) 1000 UNITS tablet Take 1,000 Units by mouth 2 (two) times daily.      . diclofenac (VOLTAREN) 75 MG EC tablet Take 1 tablet (75 mg total) by mouth 2 (two) times daily with a meal.  60 tablet  2  . fluticasone (FLONASE) 50 MCG/ACT nasal spray 2 sprays in each nostril once daily as needed  16 g  11  . ibuprofen (ADVIL,MOTRIN) 400 MG tablet Take 400 mg by mouth every 6 (six) hours as needed.      Marland Kitchen atorvastatin (LIPITOR) 10 MG tablet Take 1 tablet (10 mg total) by mouth daily.  30 tablet  6   Review of Systems  Constitutional: Negative.   HENT: Negative.   Eyes: Negative.   Respiratory: Negative.   Cardiovascular: Negative.   Gastrointestinal: Negative.     Genitourinary: Positive for vaginal bleeding and menstrual problem.  Musculoskeletal: Negative.   Skin: Negative.   Neurological: Negative.   Hematological: Negative.   Psychiatric/Behavioral: Negative.   All other systems reviewed and are negative.    BP 140/90  Pulse 76  Ht 5\' 9"  (1.753 m)  Wt 197 lb 12 oz (89.699 kg)  BMI 29.20 kg/m2  LMP 11/28/2011  Physical Exam  Nursing note and vitals reviewed. Constitutional: She is oriented to person, place, and time. She appears well-developed and well-nourished.  HENT:  Head: Normocephalic.  Nose: Nose normal.  Mouth/Throat: Oropharynx is clear and moist.  Eyes: Conjunctivae are normal. Pupils are equal, round, and reactive to light.  Neck: Normal range of motion. Neck supple. No JVD present.  Cardiovascular: Normal rate, regular rhythm, S1 normal, S2 normal, normal heart sounds and intact distal pulses.  Exam reveals no gallop and no friction rub.   No murmur heard. Pulmonary/Chest: Effort normal and breath sounds normal. No respiratory distress. She has no wheezes. She has no rales. She exhibits no tenderness.  Abdominal: Soft. Bowel sounds are normal. She exhibits no distension. There is no tenderness.  Musculoskeletal: Normal range of motion. She exhibits no edema and no tenderness.  Lymphadenopathy:    She has no cervical adenopathy.  Neurological: She is alert and oriented to person,  place, and time. Coordination normal.  Skin: Skin is warm and dry. No rash noted. No erythema.  Psychiatric: She has a normal mood and affect. Her behavior is normal. Judgment and thought content normal.         Assessment and Plan

## 2011-12-05 NOTE — Assessment & Plan Note (Signed)
Blood pressure is borderline elevated today. We have asked her to monitor her blood pressure at home and call our office for diastolic pressure greater than 90 on a regular basis, systolic pressure greater than 140. We have encouraged her to lose weight through the summer as this will help cholesterol and blood pressure.

## 2011-12-05 NOTE — Patient Instructions (Signed)
You are doing well. Please start lipitor 10 mg once a day after your surgery  Please call us if you have new issues that need to be addressed before your next appt.

## 2011-12-08 MED ORDER — CEFAZOLIN SODIUM-DEXTROSE 2-3 GM-% IV SOLR
2.0000 g | INTRAVENOUS | Status: AC
  Administered 2011-12-09: 2 g via INTRAVENOUS
  Filled 2011-12-08: qty 50

## 2011-12-09 ENCOUNTER — Observation Stay (HOSPITAL_COMMUNITY)
Admission: RE | Admit: 2011-12-09 | Discharge: 2011-12-11 | DRG: 359 | Disposition: A | Discharge status: Home / Self Care | Payer: BC Managed Care – PPO | Source: Ambulatory Visit | Attending: Obstetrics & Gynecology | Admitting: Obstetrics & Gynecology

## 2011-12-09 ENCOUNTER — Inpatient Hospital Stay (HOSPITAL_COMMUNITY): Payer: BC Managed Care – PPO | Admitting: Anesthesiology

## 2011-12-09 ENCOUNTER — Encounter (HOSPITAL_COMMUNITY)
Admission: RE | Disposition: A | Discharge status: Home / Self Care | Payer: Self-pay | Source: Ambulatory Visit | Attending: Obstetrics & Gynecology

## 2011-12-09 ENCOUNTER — Encounter (HOSPITAL_COMMUNITY): Payer: Self-pay | Admitting: Anesthesiology

## 2011-12-09 ENCOUNTER — Encounter (HOSPITAL_COMMUNITY): Payer: Self-pay | Admitting: Obstetrics & Gynecology

## 2011-12-09 DIAGNOSIS — I2510 Atherosclerotic heart disease of native coronary artery without angina pectoris: Secondary | ICD-10-CM | POA: Diagnosis present

## 2011-12-09 DIAGNOSIS — R102 Pelvic and perineal pain: Secondary | ICD-10-CM | POA: Diagnosis present

## 2011-12-09 DIAGNOSIS — I10 Essential (primary) hypertension: Secondary | ICD-10-CM | POA: Diagnosis present

## 2011-12-09 DIAGNOSIS — N949 Unspecified condition associated with female genital organs and menstrual cycle: Secondary | ICD-10-CM

## 2011-12-09 DIAGNOSIS — D259 Leiomyoma of uterus, unspecified: Secondary | ICD-10-CM | POA: Diagnosis present

## 2011-12-09 DIAGNOSIS — Z5331 Laparoscopic surgical procedure converted to open procedure: Secondary | ICD-10-CM

## 2011-12-09 DIAGNOSIS — D251 Intramural leiomyoma of uterus: Principal | ICD-10-CM | POA: Diagnosis present

## 2011-12-09 HISTORY — PX: LAPAROSCOPIC SUPRACERVICAL HYSTERECTOMY: SHX5399

## 2011-12-09 HISTORY — PX: SUPRACERVICAL ABDOMINAL HYSTERECTOMY: SHX5393

## 2011-12-09 HISTORY — PX: SALPINGOOPHORECTOMY: SHX82

## 2011-12-09 LAB — TYPE AND SCREEN: ABO/RH(D): O POS

## 2011-12-09 LAB — PREGNANCY, URINE: Preg Test, Ur: NEGATIVE

## 2011-12-09 SURGERY — HYSTERECTOMY, SUPRACERVICAL, LAPAROSCOPIC
Anesthesia: General | Site: Abdomen

## 2011-12-09 MED ORDER — MIDAZOLAM HCL 2 MG/2ML IJ SOLN
INTRAMUSCULAR | Status: AC
  Filled 2011-12-09: qty 2

## 2011-12-09 MED ORDER — PANTOPRAZOLE SODIUM 40 MG PO TBEC
40.0000 mg | DELAYED_RELEASE_TABLET | Freq: Every day | ORAL | Status: DC
  Administered 2011-12-10: 40 mg via ORAL
  Filled 2011-12-09 (×4): qty 1

## 2011-12-09 MED ORDER — HYDROMORPHONE 0.3 MG/ML IV SOLN
INTRAVENOUS | Status: DC
  Administered 2011-12-09: 14:00:00 via INTRAVENOUS
  Administered 2011-12-09: 0.3 mg via INTRAVENOUS
  Administered 2011-12-10: 0.6 mg via INTRAVENOUS
  Filled 2011-12-09: qty 25

## 2011-12-09 MED ORDER — FLUTICASONE PROPIONATE 50 MCG/ACT NA SUSP: 2.0000 | Freq: Every day | NASAL | Status: DC | PRN

## 2011-12-09 MED ORDER — ONDANSETRON HCL 4 MG/2ML IJ SOLN
INTRAMUSCULAR | Status: AC
  Filled 2011-12-09: qty 2

## 2011-12-09 MED ORDER — DIPHENHYDRAMINE HCL 12.5 MG/5ML PO ELIX: 12.5000 mg | ORAL_SOLUTION | Freq: Four times a day (QID) | ORAL | Status: DC | PRN

## 2011-12-09 MED ORDER — FENTANYL CITRATE 0.05 MG/ML IJ SOLN
INTRAMUSCULAR | Status: AC
  Filled 2011-12-09: qty 2

## 2011-12-09 MED ORDER — ONDANSETRON HCL 4 MG/2ML IJ SOLN: 4.0000 mg | Freq: Four times a day (QID) | INTRAMUSCULAR | Status: DC | PRN

## 2011-12-09 MED ORDER — ONDANSETRON HCL 4 MG/2ML IJ SOLN
INTRAMUSCULAR | Status: DC | PRN
  Administered 2011-12-09: 4 mg via INTRAVENOUS

## 2011-12-09 MED ORDER — KETOROLAC TROMETHAMINE 30 MG/ML IJ SOLN: 15.0000 mg | Freq: Once | INTRAMUSCULAR | Status: DC | PRN

## 2011-12-09 MED ORDER — DIPHENHYDRAMINE HCL 50 MG/ML IJ SOLN: 12.5000 mg | Freq: Four times a day (QID) | INTRAMUSCULAR | Status: DC | PRN

## 2011-12-09 MED ORDER — ROCURONIUM BROMIDE 100 MG/10ML IV SOLN
INTRAVENOUS | Status: DC | PRN
  Administered 2011-12-09: 10 mg via INTRAVENOUS
  Administered 2011-12-09: 50 mg via INTRAVENOUS

## 2011-12-09 MED ORDER — SCOPOLAMINE 1 MG/3DAYS TD PT72
1.0000 | MEDICATED_PATCH | Freq: Once | TRANSDERMAL | Status: DC
  Administered 2011-12-09: 1.5 mg via TRANSDERMAL

## 2011-12-09 MED ORDER — DOCUSATE SODIUM 100 MG PO CAPS
100.0000 mg | ORAL_CAPSULE | Freq: Two times a day (BID) | ORAL | Status: DC
  Administered 2011-12-09 – 2011-12-11 (×4): 100 mg via ORAL
  Filled 2011-12-09 (×4): qty 1

## 2011-12-09 MED ORDER — HYDROMORPHONE HCL PF 1 MG/ML IJ SOLN
INTRAMUSCULAR | Status: AC
  Filled 2011-12-09: qty 1

## 2011-12-09 MED ORDER — FENTANYL CITRATE 0.05 MG/ML IJ SOLN
INTRAMUSCULAR | Status: DC | PRN
  Administered 2011-12-09 (×3): 50 ug via INTRAVENOUS
  Administered 2011-12-09: 25 ug via INTRAVENOUS
  Administered 2011-12-09: 50 ug via INTRAVENOUS

## 2011-12-09 MED ORDER — PROPOFOL 10 MG/ML IV EMUL
INTRAVENOUS | Status: DC | PRN
  Administered 2011-12-09: 150 mg via INTRAVENOUS

## 2011-12-09 MED ORDER — LACTATED RINGERS IV SOLN
INTRAVENOUS | Status: DC
  Administered 2011-12-09 – 2011-12-10 (×2): via INTRAVENOUS

## 2011-12-09 MED ORDER — GLYCOPYRROLATE 0.2 MG/ML IJ SOLN
INTRAMUSCULAR | Status: AC
  Filled 2011-12-09: qty 1

## 2011-12-09 MED ORDER — MENTHOL 3 MG MT LOZG: 1.0000 | LOZENGE | OROMUCOSAL | Status: DC | PRN

## 2011-12-09 MED ORDER — LIDOCAINE HCL (CARDIAC) 20 MG/ML IV SOLN
INTRAVENOUS | Status: DC | PRN
  Administered 2011-12-09: 50 mg via INTRAVENOUS

## 2011-12-09 MED ORDER — DEXAMETHASONE SODIUM PHOSPHATE 10 MG/ML IJ SOLN
INTRAMUSCULAR | Status: AC
  Filled 2011-12-09: qty 1

## 2011-12-09 MED ORDER — ZOLPIDEM TARTRATE 5 MG PO TABS: 5.0000 mg | ORAL_TABLET | Freq: Every evening | ORAL | Status: DC | PRN

## 2011-12-09 MED ORDER — MIDAZOLAM HCL 5 MG/5ML IJ SOLN
INTRAMUSCULAR | Status: DC | PRN
  Administered 2011-12-09: 2 mg via INTRAVENOUS

## 2011-12-09 MED ORDER — SODIUM CHLORIDE 0.9 % IJ SOLN: 9.0000 mL | INTRAMUSCULAR | Status: DC | PRN

## 2011-12-09 MED ORDER — NEOSTIGMINE METHYLSULFATE 1 MG/ML IJ SOLN
INTRAMUSCULAR | Status: DC | PRN
  Administered 2011-12-09: 3 mg via INTRAVENOUS

## 2011-12-09 MED ORDER — NEOSTIGMINE METHYLSULFATE 1 MG/ML IJ SOLN
INTRAMUSCULAR | Status: AC
  Filled 2011-12-09: qty 10

## 2011-12-09 MED ORDER — GLYCOPYRROLATE 0.2 MG/ML IJ SOLN
INTRAMUSCULAR | Status: DC | PRN
  Administered 2011-12-09: 0.6 mg via INTRAVENOUS

## 2011-12-09 MED ORDER — ROCURONIUM BROMIDE 50 MG/5ML IV SOLN
INTRAVENOUS | Status: AC
  Filled 2011-12-09: qty 2

## 2011-12-09 MED ORDER — KETOROLAC TROMETHAMINE 30 MG/ML IJ SOLN
30.0000 mg | Freq: Once | INTRAMUSCULAR | Status: AC
  Administered 2011-12-09: 30 mg via INTRAVENOUS

## 2011-12-09 MED ORDER — EPHEDRINE SULFATE 50 MG/ML IJ SOLN
INTRAMUSCULAR | Status: DC | PRN
  Administered 2011-12-09 (×2): 10 mg via INTRAVENOUS
  Administered 2011-12-09 (×4): 5 mg via INTRAVENOUS

## 2011-12-09 MED ORDER — EPHEDRINE 5 MG/ML INJ
INTRAVENOUS | Status: AC
  Filled 2011-12-09: qty 10

## 2011-12-09 MED ORDER — BUPIVACAINE HCL (PF) 0.5 % IJ SOLN
INTRAMUSCULAR | Status: DC | PRN
  Administered 2011-12-09: 30 mL

## 2011-12-09 MED ORDER — DEXAMETHASONE SODIUM PHOSPHATE 4 MG/ML IJ SOLN
INTRAMUSCULAR | Status: DC | PRN
  Administered 2011-12-09: 10 mg via INTRAVENOUS

## 2011-12-09 MED ORDER — 0.9 % SODIUM CHLORIDE (POUR BTL) OPTIME
TOPICAL | Status: DC | PRN
  Administered 2011-12-09: 1000 mL

## 2011-12-09 MED ORDER — LACTATED RINGERS IV SOLN
INTRAVENOUS | Status: DC
  Administered 2011-12-09 (×4): via INTRAVENOUS

## 2011-12-09 MED ORDER — SIMETHICONE 80 MG PO CHEW: 80.0000 mg | CHEWABLE_TABLET | Freq: Four times a day (QID) | ORAL | Status: DC | PRN

## 2011-12-09 MED ORDER — OXYCODONE-ACETAMINOPHEN 5-325 MG PO TABS
1.0000 | ORAL_TABLET | ORAL | Status: DC | PRN
  Administered 2011-12-10: 2 via ORAL
  Administered 2011-12-10 – 2011-12-11 (×2): 1 via ORAL
  Filled 2011-12-09 (×2): qty 1
  Filled 2011-12-09: qty 2

## 2011-12-09 MED ORDER — SCOPOLAMINE 1 MG/3DAYS TD PT72
MEDICATED_PATCH | TRANSDERMAL | Status: AC
  Administered 2011-12-09: 1.5 mg via TRANSDERMAL
  Filled 2011-12-09: qty 1

## 2011-12-09 MED ORDER — VITAMIN D3 25 MCG (1000 UNIT) PO TABS
1000.0000 [IU] | ORAL_TABLET | Freq: Two times a day (BID) | ORAL | Status: DC
  Administered 2011-12-09 – 2011-12-11 (×4): 1000 [IU] via ORAL
  Filled 2011-12-09 (×6): qty 1

## 2011-12-09 MED ORDER — IBUPROFEN 600 MG PO TABS
600.0000 mg | ORAL_TABLET | Freq: Four times a day (QID) | ORAL | Status: DC | PRN
  Administered 2011-12-10 – 2011-12-11 (×2): 600 mg via ORAL
  Filled 2011-12-09 (×2): qty 1

## 2011-12-09 MED ORDER — KETOROLAC TROMETHAMINE 30 MG/ML IJ SOLN
INTRAMUSCULAR | Status: AC
  Administered 2011-12-09: 30 mg via INTRAVENOUS
  Filled 2011-12-09: qty 1

## 2011-12-09 MED ORDER — LIDOCAINE HCL (CARDIAC) 20 MG/ML IV SOLN
INTRAVENOUS | Status: AC
  Filled 2011-12-09: qty 5

## 2011-12-09 MED ORDER — BUPIVACAINE HCL (PF) 0.5 % IJ SOLN
INTRAMUSCULAR | Status: AC
  Filled 2011-12-09: qty 30

## 2011-12-09 MED ORDER — HYDROMORPHONE HCL PF 1 MG/ML IJ SOLN
0.2500 mg | INTRAMUSCULAR | Status: DC | PRN
  Administered 2011-12-09: 0.5 mg via INTRAVENOUS

## 2011-12-09 MED ORDER — ONDANSETRON HCL 4 MG PO TABS: 4.0000 mg | ORAL_TABLET | Freq: Four times a day (QID) | ORAL | Status: DC | PRN

## 2011-12-09 MED ORDER — FENTANYL CITRATE 0.05 MG/ML IJ SOLN
INTRAMUSCULAR | Status: AC
  Filled 2011-12-09: qty 5

## 2011-12-09 MED ORDER — NALOXONE HCL 0.4 MG/ML IJ SOLN: 0.4000 mg | INTRAMUSCULAR | Status: DC | PRN

## 2011-12-09 MED ORDER — PROPOFOL 10 MG/ML IV EMUL
INTRAVENOUS | Status: AC
  Filled 2011-12-09: qty 20

## 2011-12-09 SURGICAL SUPPLY — 48 items
BARRIER ADHS 3X4 INTERCEED (GAUZE/BANDAGES/DRESSINGS) IMPLANT
BLADE LAPAROSCOPIC MORCELL KIT (BLADE) IMPLANT
BLADE SURG 10 STRL SS (BLADE) ×9 IMPLANT
CABLE HIGH FREQUENCY MONO STRZ (ELECTRODE) IMPLANT
CHLORAPREP W/TINT 26ML (MISCELLANEOUS) ×3 IMPLANT
CLOTH BEACON ORANGE TIMEOUT ST (SAFETY) ×3 IMPLANT
COVER MAYO STAND STRL (DRAPES) ×3 IMPLANT
DRAPE PROXIMA HALF (DRAPES) ×6 IMPLANT
DRESSING TELFA 8X3 (GAUZE/BANDAGES/DRESSINGS) ×3 IMPLANT
DRSG COVADERM 4X10 (GAUZE/BANDAGES/DRESSINGS) ×3 IMPLANT
DRSG COVADERM PLUS 2X2 (GAUZE/BANDAGES/DRESSINGS) ×3 IMPLANT
EVACUATOR SMOKE 8.L (FILTER) ×6 IMPLANT
GLOVE BIO SURGEON STRL SZ 6.5 (GLOVE) ×6 IMPLANT
GLOVE BIO SURGEON STRL SZ7 (GLOVE) ×6 IMPLANT
GLOVE BIOGEL PI IND STRL 7.0 (GLOVE) ×4 IMPLANT
GLOVE BIOGEL PI INDICATOR 7.0 (GLOVE) ×2
GLOVE ECLIPSE 6.5 STRL STRAW (GLOVE) ×6 IMPLANT
GLOVE INDICATOR 6.5 STRL GRN (GLOVE) ×9 IMPLANT
GOWN PREVENTION PLUS LG XLONG (DISPOSABLE) ×9 IMPLANT
NEEDLE INSUFFLATION 14GA 120MM (NEEDLE) ×3 IMPLANT
NS IRRIG 1000ML POUR BTL (IV SOLUTION) ×3 IMPLANT
PACK LAPAROSCOPY BASIN (CUSTOM PROCEDURE TRAY) ×3 IMPLANT
PAD ABD 7.5X8 STRL (GAUZE/BANDAGES/DRESSINGS) ×3 IMPLANT
PENCIL BUTTON HOLSTER BLD 10FT (ELECTRODE) ×3 IMPLANT
SCALPEL HARMONIC ACE (MISCELLANEOUS) IMPLANT
SET CYSTO W/LG BORE CLAMP LF (SET/KITS/TRAYS/PACK) ×6 IMPLANT
SET IRRIG TUBING LAPAROSCOPIC (IRRIGATION / IRRIGATOR) ×3 IMPLANT
SLEEVE ADV FIXATION 5X100MM (TROCAR) ×3 IMPLANT
SPONGE GAUZE 4X4 12PLY (GAUZE/BANDAGES/DRESSINGS) ×3 IMPLANT
SPONGE LAP 18X18 X RAY DECT (DISPOSABLE) ×9 IMPLANT
SUT PLAIN 2 0 XLH (SUTURE) ×3 IMPLANT
SUT VIC AB 0 CT1 18XCR BRD8 (SUTURE) ×4 IMPLANT
SUT VIC AB 0 CT1 27 (SUTURE) ×1
SUT VIC AB 0 CT1 27XBRD ANBCTR (SUTURE) ×2 IMPLANT
SUT VIC AB 0 CT1 36 (SUTURE) ×3 IMPLANT
SUT VIC AB 0 CT1 8-18 (SUTURE) ×2
SUT VIC AB 4-0 KS 27 (SUTURE) ×3 IMPLANT
SUT VICRYL 0 TIES 12 18 (SUTURE) ×3 IMPLANT
SUT VICRYL 0 UR6 27IN ABS (SUTURE) IMPLANT
SUT VICRYL 4-0 PS2 18IN ABS (SUTURE) ×6 IMPLANT
TAPE CLOTH SURG 4X10 WHT LF (GAUZE/BANDAGES/DRESSINGS) ×3 IMPLANT
TOWEL OR 17X24 6PK STRL BLUE (TOWEL DISPOSABLE) ×6 IMPLANT
TRAY FOLEY CATH 14FR (SET/KITS/TRAYS/PACK) ×3 IMPLANT
TROCAR XCEL NON-BLD 11X100MML (ENDOMECHANICALS) ×3 IMPLANT
TROCAR Z-THREAD FIOS 12X100MM (TROCAR) ×3 IMPLANT
TROCAR Z-THREAD FIOS 5X100MM (TROCAR) ×3 IMPLANT
WARMER LAPAROSCOPE (MISCELLANEOUS) ×3 IMPLANT
YANKAUER SUCT BULB TIP NO VENT (SUCTIONS) ×9 IMPLANT

## 2011-12-09 NOTE — Op Note (Addendum)
Barbara Kaiser PROCEDURE DATE: 12/09/2011   PREOPERATIVE DIAGNOSIS: Uterine fibroids, chronic pelvic pain  POSTOPERATIVE DIAGNOSIS: The same PROCEDURE: Diagnostic laparoscopy, Abdominal supracervical hysterectomy, Bilateral salpingo-oophorectomy SURGEON:  Dr. Jaynie Collins ASSISTANT: Dr. Nicholaus Bloom  INDICATIONS: 56 y.o. G2P2 with history of fibroid uterus and pelvic pain desiring definitive surgical management.  She was consented for laparoscopic supracervical hysterectomy  and bilateral salpingo-oophorectomy.  Risks of surgery were discussed with the patient including but not limited to: bleeding which may require transfusion or reoperation; infection which may require antibiotics; injury to bowel, bladder, ureters or other surrounding organs; need for additional procedures including laparotomy; thromboembolic phenomenon, incisional problems and other postoperative/anesthesia complications. Written informed consent was obtained.    FINDINGS:  Enlarged 16 week sized fibroid uterus, normal adnexa bilaterally.  No evidence of endometriosis, adhesions or any other abdominal/pelvic abnormality.  Normal upper abdomen.  ANESTHESIA:    General INTRAVENOUS FLUIDS:1400  ml ESTIMATED BLOOD LOSS: 300 ml URINE OUTPUT: 100 ml SPECIMENS: Uterus, bilateral fallopian tubes and ovaries COMPLICATIONS: None immediate  PROCEDURE IN DETAIL:  The patient received intravenous antibiotics and had sequential compression devices applied to her lower extremities while in the preoperative area.  She was then taken to the operating room where general anesthesia was administered and was found to be adequate.  She was placed in the dorsal lithotomy position, and was prepped and draped in a sterile manner.  A Foley catheter was inserted into her bladder and attached to constant drainage and a uterine manipulator was then advanced into the uterus .  After an adequate timeout was performed, attention was then turned to the  patient's abdomen where a 10-mm skin incision was made on the umbilical fold.  The Veress needle was carefully introduced into the peritoneal cavity at a 45-degree angle into the abdominal wall.  Intraperitoneal placement was confirmed by drop in intraabdominal pressure with insufflation of carbon dioxide gas.  Adequate pneumoperitoneum was obtained, and the 10-mm trocar and sleeve were then advanced without difficulty into the abdomen where intraabdominal placement was confirmed by the laparoscope. A survey of the patient's pelvis and abdomen revealed an enlarged fibroid uterus which was difficult to manipulate due the fibroids. Unable to access the area of uterine vessels due to the fibroids.  The decision was made to convert to laparotomy.  The abdomen was desufflated; the trocar and the uterine manipulator were removed without complications.  The fascial incision was repaired with 0 Vicryl, and the skin was closed with 3-0 Vicryl.   Patient was then placed in dorsal supine position.  After an adequate interval timeout was performed, a Pfannensteil skin incision was made. This incision was taken down to the fascia using electrocautery with care given to maintain good hemostasis. The fascia was incised in the midline and the fascial incision was then extended bilaterally using electrocautery without difficulty. The fascia was then dissected off the underlying rectus muscles using blunt and sharp dissection. The rectus muscles were split bluntly in the midline and the peritoneum entered sharply without complication. This peritoneal incision was then extended superiorly and inferiorly with care given to prevent bowel or bladder injury. Attention was then turned to the pelvis. The uterus at this point was noted to be mobilized and was delivered up out of the abdomen.  The bowel was packed away with moist laparotomy sponges. The round ligaments on each side were clamped, suture ligated with 0 Vicryl, and transected  with electrocautery allowing entry into the broad ligament. Of note, all sutures  used in this procedure are 0 Vicryl unless otherwise noted.  A hole was created in the clear portion of the posterior broad ligament and the infundibulopelvic ligament clamped on the patient's right side. This pedicle was then clamped, cut, and doubly suture ligated with good hemostasis.  This procedure was repeated in an identical fashion on the opposite side, incorporating both ovaries and tubes in the pathology specimen, and allowing salpingoophorectomy.  A bladder flap was then created.  The bladder was then bluntly dissected off the lower uterine segment and cervix with good hemostasis noted. The uterine arteries were then skeletonized bilaterally and then clamped, cut, and doubly suture ligated with care given to prevent ureteral injury.  The uterus was then amputated across the lower uterine segment leaving the cervix intact. The specimen was sent to pathology. The cervical canal was coagulated, and the anterior and posterior peritoneal edges were then reapproximated in the midline over the cervical stump without complication.  The pelvis was irrigated and hemostasis was reconfirmed at all pedicles and along the pelvic sidewall.  The ureters were inspected and noted to be peristalsing bilaterally.  All laparotomy sponges and instruments were removed from the abdomen. The peritoneum was closed with interrupted stitches, and the fascia was closed in a running fashion. The subcutaneous layer was reapproximated with 2-0 plain gut. The skin was closed with a 4-0 Vicryl subcuticular stitch. Sponge, lap, needle, and instrument counts were correct times two. The patient was taken to the recovery area awake, extubated and in stable condition.  Jaynie Collins, M.D. 12/09/2011 11:54 AM

## 2011-12-09 NOTE — OR Nursing (Signed)
Attempted laparoscopic hysterectomy converted to open abdominal hysterectomy

## 2011-12-09 NOTE — Addendum Note (Signed)
Addendum  created 12/09/11 1725 by Suella Grove, CRNA   Modules edited:Notes Section

## 2011-12-09 NOTE — Anesthesia Preprocedure Evaluation (Signed)
Anesthesia Evaluation  Patient identified by MRN, date of birth, ID band Patient awake    Reviewed: Allergy & Precautions, H&P , NPO status , Patient's Chart, lab work & pertinent test results, reviewed documented beta blocker date and time   History of Anesthesia Complications (+) PONV and PROLONGED EMERGENCE  Airway Mallampati: III TM Distance: >3 FB Neck ROM: full    Dental  (+) Upper Dentures, Edentulous Upper and Poor Dentition   Pulmonary neg pulmonary ROS, former smoker (quit 10 years ago) breath sounds clear to auscultation  Pulmonary exam normal       Cardiovascular Exercise Tolerance: Good hypertension (pt denies, but BP is 158/82 today), + angina at rest + CAD (last cardiac cath Nov 2006 for angina - had mild CAD in circumflex (no stents or angioplasty), EF was 60%) Rhythm:regular Rate:Normal  Seen by cardiologist on 12/05/2011 - cleared for surgery   Neuro/Psych negative neurological ROS  negative psych ROS   GI/Hepatic negative GI ROS, Neg liver ROS,   Endo/Other  negative endocrine ROS  Renal/GU negative Renal ROS  Female GU complaint     Musculoskeletal   Abdominal   Peds  Hematology negative hematology ROS (+)   Anesthesia Other Findings   Reproductive/Obstetrics negative OB ROS                           Anesthesia Physical Anesthesia Plan  ASA: III  Anesthesia Plan: General ETT   Post-op Pain Management:    Induction:   Airway Management Planned:   Additional Equipment:   Intra-op Plan:   Post-operative Plan:   Informed Consent: I have reviewed the patients History and Physical, chart, labs and discussed the procedure including the risks, benefits and alternatives for the proposed anesthesia with the patient or authorized representative who has indicated his/her understanding and acceptance.   Dental Advisory Given  Plan Discussed with: CRNA and  Surgeon  Anesthesia Plan Comments:         Anesthesia Quick Evaluation

## 2011-12-09 NOTE — Anesthesia Postprocedure Evaluation (Signed)
  Anesthesia Post-op Note  Patient: Barbara Kaiser  Procedure(s) Performed: Procedure(s) (LRB): LAPAROSCOPIC SUPRACERVICAL HYSTERECTOMY (N/A) SALPINGO OOPHERECTOMY (Bilateral) HYSTERECTOMY SUPRACERVICAL ABDOMINAL (N/A)  Patient is awake and responsive. Pain and nausea are reasonably well controlled. Vital signs are stable and clinically acceptable. Oxygen saturation is clinically acceptable. There are no apparent anesthetic complications at this time. Patient is ready for discharge.

## 2011-12-09 NOTE — Anesthesia Procedure Notes (Signed)
Procedure Name: Intubation Date/Time: 12/09/2011 10:05 AM Performed by: Chayna Surratt, Jannet Askew Pre-anesthesia Checklist: Emergency Drugs available, Timeout performed, Suction available, Patient identified and Patient being monitored Patient Re-evaluated:Patient Re-evaluated prior to inductionOxygen Delivery Method: Circle system utilized Preoxygenation: Pre-oxygenation with 100% oxygen Intubation Type: IV induction Ventilation: Mask ventilation without difficulty Laryngoscope Size: Mac and 4 Grade View: Grade I Tube type: Oral Number of attempts: 1 Placement Confirmation: ETT inserted through vocal cords under direct vision,  breath sounds checked- equal and bilateral and positive ETCO2 Secured at: 22 cm Dental Injury: Teeth and Oropharynx as per pre-operative assessment

## 2011-12-09 NOTE — Transfer of Care (Signed)
Immediate Anesthesia Transfer of Care Note  Patient: Lenka Raju  Procedure(s) Performed: Procedure(s) (LRB): LAPAROSCOPIC SUPRACERVICAL HYSTERECTOMY (N/A) SALPINGO OOPHERECTOMY (Bilateral) HYSTERECTOMY SUPRACERVICAL ABDOMINAL (N/A)  Patient Location: PACU  Anesthesia Type: General  Level of Consciousness: awake, alert  and oriented  Airway & Oxygen Therapy: Patient Spontanous Breathing and Patient connected to nasal cannula oxygen  Post-op Assessment: Report given to PACU RN and Post -op Vital signs reviewed and stable  Post vital signs: Reviewed and stable  Complications: No apparent anesthesia complications

## 2011-12-09 NOTE — Anesthesia Postprocedure Evaluation (Signed)
  Anesthesia Post-op Note  Patient: Barbara Kaiser  Procedure(s) Performed: Procedure(s) (LRB): LAPAROSCOPIC SUPRACERVICAL HYSTERECTOMY (N/A) SALPINGO OOPHERECTOMY (Bilateral) HYSTERECTOMY SUPRACERVICAL ABDOMINAL (N/A)  Patient Location: [facility]  Anesthesia Type: General  Level of Consciousness: awake  Airway and Oxygen Therapy: Patient Spontanous Breathing  Post-op Pain: none  Post-op Assessment: Patient's Cardiovascular Status Stable and Respiratory Function Stable  Post-op Vital Signs: Reviewed and stable  Complications: No apparent anesthesia complications

## 2011-12-09 NOTE — Discharge Instructions (Signed)
Hysterectomy Care After Refer to this sheet in the next few weeks. These instructions provide you with information on caring for yourself after your procedure. Your caregiver may also give you more specific instructions. Your treatment has been planned according to current medical practices, but problems sometimes occur. Call your caregiver if you have any problems or questions after your procedure. HOME CARE INSTRUCTIONS  Healing will take time. You may have discomfort, tenderness, swelling, and bruising at the surgical site for about 2 weeks. This is normal and will get better as time goes on.  Only take over-the-counter or prescription medicines for pain, discomfort, or fever as directed by your caregiver.   Do not take aspirin. It can cause bleeding.   Do not drive when taking pain medicine.   Follow your caregiver's advice regarding exercise, lifting, driving, and general activities.   Resume your usual diet as directed and allowed.   Get plenty of rest and sleep.   Do not douche, use tampons, or have sexual intercourse for at least 6 weeks or until your caregiver gives you permission.   Change your bandages (dressings) as directed by your caregiver.   Take showers instead of baths for 2 to 3 weeks.   Do not drink alcohol until your caregiver gives you permission.   If you are constipated, you may take a mild laxative with your caregiver's permission. Bran foods may help with constipation problems. Drinking enough fluids to keep your urine clear or pale yellow may help as well.   Try to have someone home with you for 1 or 2 weeks to help around the house.   Keep all of your follow-up appointments as directed by your caregiver.  SEEK MEDICAL CARE IF:   You have swelling, redness, or increasing pain in the surgical cut (incision) area.   You have pus coming from the incision.   You notice a bad smell coming from the incision or dressing.   You have swelling, redness, or pain  around the intravenous (IV) site.   Your incision breaks open.   You feel dizzy or lightheaded.   You have pain or bleeding when you urinate.   You have persistent diarrhea.   You have persistent nausea and vomiting.   You have abnormal vaginal discharge.   You have a rash.   You have any type of abnormal reaction or develop an allergy to your medicine.   Your pain is not controlled with your prescribed medicine.  SEEK IMMEDIATE MEDICAL CARE IF:   You have a fever.   You have severe abdominal pain.   You have chest pain.   You have shortness of breath.   You faint.   You have pain, swelling, or redness of your leg.   You have heavy vaginal bleeding with blood clots.  MAKE SURE YOU:  Understand these instructions.   Will watch your condition.   Will get help right away if you are not doing well or get worse.  Document Released: 01/04/2005 Document Revised: 06/06/2011 Document Reviewed: 02/01/2011 [facility] Patient Information 2012 [place], [place].

## 2011-12-09 NOTE — H&P (Signed)
Preoperative History and Physical  Barbara Kaiser is a 56 y.o. G2P2 here for surgical management of enlarged fibroid uterus and pelvic pain. No abnormal bleeding after endometrial ablation in 2008.  Patient also has a history of CAD, recently underwent cardiac clearance by Dr. Julien Nordmann in [facility].  She has no current complaints or concerns.  Proposed surgery: Laparoscopic supracervical hysterectomy and bilateral salpingo-oophorectomy  PMH:    Past Medical History  Diagnosis Date  . Allergic rhinitis   . Anemia, iron deficiency   . Asthma   . Uterine fibroid     with heavy menses and anemia (resolved by ablation)  . Colon polyps 3/10  . Complication of anesthesia     hard to wake   PSH:     Past Surgical History  Procedure Date  . Nasal sinus surgery 11/07  . Tubal ligation   . Dilation and curettage of uterus 12/08    hysteroscopy and endometrial ablation  . Cardiac catheterization 05/2005    mild CAD   POb/GynH:   OB History    Grav Para Term Preterm Abortions TAB SAB Ect Mult Living   2 2        2     Patient denies any cervical dysplasia or STIs. Normal pap smear in 08/27/11  Medications:  Prescriptions prior to admission  Medication Sig Dispense Refill  . aspirin 81 MG tablet Take 81 mg by mouth as needed.      . cholecalciferol (VITAMIN D) 1000 UNITS tablet Take 1,000 Units by mouth 2 (two) times daily.      . diclofenac (VOLTAREN) 75 MG EC tablet Take 1 tablet (75 mg total) by mouth 2 (two) times daily with a meal.  60 tablet  2  . fluticasone (FLONASE) 50 MCG/ACT nasal spray 2 sprays in each nostril once daily as needed  16 g  11  . ibuprofen (ADVIL,MOTRIN) 400 MG tablet Take 400 mg by mouth every 6 (six) hours as needed.      Marland Kitchen acetaminophen (TYLENOL) 500 MG tablet Take 500 mg by mouth every 6 (six) hours as needed.       Allergies:  Allergies  Allergen Reactions  . Codeine Nausea And Vomiting   SH:   History  Substance Use Topics  . Smoking  status: Former Smoker -- 0.2 packs/day for 24 years    Quit date: 07/01/2001  . Smokeless tobacco: Not on file  . Alcohol Use: No   FH:    Family History  Problem Relation Age of Onset  . Hypertension Mother   . Cancer Mother 20    uterine  . Hypertension Father   . Heart disease Other     MI  . Diabetes Other   . Stroke Other   . Heart disease Brother     age 1 and 15  . Cancer Other     lung   Review of Systems: Noncontributory  PHYSICAL EXAM: Blood pressure 158/82, pulse 58, temperature 97.5 F (36.4 C), temperature source Oral, resp. rate 16, SpO2 100.00%. General appearance - alert, well appearing, and in no distress Chest - clear to auscultation, no wheezes, rales or rhonchi, symmetric air entry Heart - normal rate and regular rhythm Abdomen - soft, nontender, nondistended, no masses or organomegaly Pelvic - examination not indicated Extremities - peripheral pulses normal, no pedal edema, no clubbing or cyanosis  Labs: Recent Results (from the past 336 hour(s))  CBC   Collection Time   12/02/11  2:30 PM  Component Value Range   WBC 10.5  4.0 - 10.5 (K/uL)   RBC 4.47  3.87 - 5.11 (MIL/uL)   Hemoglobin 12.8  12.0 - 15.0 (g/dL)   HCT 40.9  81.1 - 91.4 (%)   MCV 88.1  78.0 - 100.0 (fL)   MCH 28.6  26.0 - 34.0 (pg)   MCHC 32.5  30.0 - 36.0 (g/dL)   RDW 78.2  95.6 - 21.3 (%)   Platelets 276  150 - 400 (K/uL)  SURGICAL PCR SCREEN   Collection Time   12/02/11  2:35 PM      Component Value Range   MRSA, PCR NEGATIVE  NEGATIVE    Staphylococcus aureus POSITIVE (*) NEGATIVE   PREGNANCY, URINE   Collection Time   12/09/11  8:12 AM      Component Value Range   Preg Test, Ur NEGATIVE  NEGATIVE    Labs and Imaging  08/27/2011 Pap smear Normal   09/04/2011 TRANSABDOMINAL AND TRANSVAGINAL ULTRASOUND OF PELVIS Clinical Data: Vaginal bleeding, enlarged uterus. Prior D&C Technique: Both transabdominal and transvaginal ultrasound examinations of the pelvis were performed.  Transabdominal technique was performed for global imaging of the pelvis including uterus, ovaries, adnexal regions, and pelvic cul-de-sac. Comparison: none It was necessary to proceed with endovaginal exam following the transabdominal exam to visualize the endometrium and ovaries, not seen transabdominally. Findings: Uterus: 9.3 x 9.0 x 8.9 cm. Anteverted, anteflexed. Multiple fibroids noted as follows: Posterior mid uterine body, intramural, with mild anterior displacement upon the endometrial stripe, measuring 6.2 x 5.2 x 4.6 cm Anterior fundus/mid uterine body, intramural, 3.4 x 3.2 x 2.4 cm Anterior mid uterine body, intramural, 2.7 x 2.4 x 2.1 cm Endometrium: 8 mm. Uniformly thin and echogenic, partly obscured by fibroids. Right ovary: 2.7 x 1.8 x 0.9 cm. Normal. Left ovary: 2.2 x 1.7 x 1.5 cm. Not well visualized, no focal abnormality identified Other findings: No free fluid IMPRESSION: Fibroid uterus as above.   Assessment: Patient Active Problem List  Diagnoses  . FIBROIDS, UTERUS  . HYPERLIPIDEMIA  . HYPERTENSION, MILD  . ALLERGIC RHINITIS  . ASTHMA  . BREAST MASS, LEFT  . Hyperglycemia  . Vitamin d deficiency  . CAD (coronary artery disease)  . Preoperative cardiovascular examination  . Pelvic pain in female attributed to fibroids    Plan: Patient desires surgical management with laparoscopic supracervical hysterectomy [facility]) and bilateral salpingo-oophorectomy.   The risks of surgery were discussed in detail with the patient including but not limited to: bleeding which may require transfusion or reoperation; infection which may require antibiotics; injury to bowel, bladder, ureters or other surrounding organs; need for additional procedures including laparotomy; thromboembolic phenomenon, incisional problems and other postoperative/anesthesia complications.  Patient was also advised that she will remain in house for 1 days; and expected recovery time after a hysterectomy is about 6  weeks.  Likelihood of success in alleviating the patient's symptoms was discussed. Discussed that patient will be surgically menopausal and will expect to have vasomotor symptoms that may need further therapy if they become debilitating.  Routine postoperative instructions will be reviewed with the patient and her family in detail after surgery.  The patient concurred with the proposed plan, giving informed written consent for the surgery.  Patient has been NPO since last night she will remain NPO for procedure.  Anesthesia and OR aware.  Preoperative prophylactic antibiotics and SCDs ordered on call to the OR.  To OR when ready.  Jaynie Collins, M.D. 12/09/2011 9:04 AM

## 2011-12-10 ENCOUNTER — Encounter (HOSPITAL_COMMUNITY): Payer: Self-pay | Admitting: Obstetrics & Gynecology

## 2011-12-10 LAB — CBC
HCT: 34.4 % — ABNORMAL LOW (ref 36.0–46.0)
Hemoglobin: 11 g/dL — ABNORMAL LOW (ref 12.0–15.0)
RBC: 3.88 MIL/uL (ref 3.87–5.11)
WBC: 15.3 10*3/uL — ABNORMAL HIGH (ref 4.0–10.5)

## 2011-12-10 NOTE — Progress Notes (Signed)
UR Chart review completed.  

## 2011-12-10 NOTE — Progress Notes (Signed)
1 Day Post-Op Procedure(s) (LRB): DIAGNOSTIC LAPAROSCOPY (N/A) HYSTERECTOMY SUPRACERVICAL ABDOMINAL (N/A) SALPINGO OOPHERECTOMY (Bilateral)  Subjective: Patient reports nausea, vomiting overnight.  This morning, she is tolerating PO and no problems voiding. Pain controlled.   Objective: I have reviewed patient's vital signs, intake and output, medications and labs.  General: alert and no distress Resp: clear to auscultation bilaterally Cardio: regular rate and rhythm GI: soft, non-tender; bowel sounds normal; no masses,  no organomegaly and incision: clean, dry, intact and old bloody drainage present on bandage Extremities: extremities normal, atraumatic, no cyanosis or edema and Homans sign is negative, no sign of DVT Vaginal Bleeding: none  Lab Results  Component Value Date   WBC 15.3* 12/10/2011   HGB 11.0* 12/10/2011   HCT 34.4* 12/10/2011   MCV 88.7 12/10/2011   PLT 259 12/10/2011  Pre-op Hemoglobin was 12.8  Assessment: s/p Procedure(s) (LRB): DIAGNOSTIC LAPAROSCOPY (N/A) HYSTERECTOMY SUPRACERVICAL ABDOMINAL (N/A) SALPINGO OOPHERECTOMY (Bilateral): stable, progressing well and tolerating diet  Plan: Advance diet Encourage ambulation Advance to PO medication Discontinue IV fluids Routine postpartum care   LOS: 1 day    Barbara Kaiser A 12/10/2011, 7:42 AM

## 2011-12-11 MED ORDER — DOCUSATE SODIUM 100 MG PO CAPS: 100.0000 mg | ORAL_CAPSULE | Freq: Two times a day (BID) | ORAL | Status: AC | PRN

## 2011-12-11 MED ORDER — DOCUSATE SODIUM 100 MG PO CAPS: 100.0000 mg | ORAL_CAPSULE | Freq: Two times a day (BID) | ORAL | Status: DC | PRN

## 2011-12-11 MED ORDER — BISACODYL 10 MG RE SUPP
10.0000 mg | Freq: Once | RECTAL | Status: AC
  Administered 2011-12-11: 10 mg via RECTAL
  Filled 2011-12-11: qty 1

## 2011-12-11 MED ORDER — FLEET ENEMA 7-19 GM/118ML RE ENEM
1.0000 | ENEMA | Freq: Every day | RECTAL | Status: DC | PRN
  Administered 2011-12-11: 1 via RECTAL

## 2011-12-11 MED ORDER — IBUPROFEN 600 MG PO TABS: 600.0000 mg | ORAL_TABLET | Freq: Four times a day (QID) | ORAL | Status: AC | PRN

## 2011-12-11 MED ORDER — OXYCODONE-ACETAMINOPHEN 5-325 MG PO TABS: 1.0000 | ORAL_TABLET | Freq: Four times a day (QID) | ORAL | Status: AC | PRN

## 2011-12-11 NOTE — Discharge Summary (Signed)
Gynecology Physician Discharge Summary  Patient ID: [name] MRN: [id number] DOB/AGE: 12-06-1955 56 y.o.  Admit date: 12/09/2011 Discharge date: 12/11/2011  Admission Diagnoses: Chronic pelvic pain, fibroids  Procedures:  DIAGNOSTIC LAPAROSCOPY HYSTERECTOMY SUPRACERVICAL ABDOMINAL  SALPINGO OOPHERECTOMY (Bilateral)  Significant Diagnostic Studies: Preoperative hemoglobin 12.8; postoperative hemoglobin 11.0 Pathology Diagnosis: Uterus, ovaries and fallopian tubes ENDOMETRIUM: EARLY SECRETORY PHASE ENDOMETRIUM. NO HYPERPLASIA, ATYPIA OR MALIGNANCY IDENTIFIED. MYOMETRIUM: LEIOMYOMATA. NO ATYPIA OR MALIGNANCY IDENTIFIED. BILATERAL ADNEXA: BENIGN OVARIAN AND FALLOPIAN TUBE TISSUE. NO TUMOR SEEN.  Hospital Course:  Barbara Kaiser is a 56 y.o. G2P2  admitted for scheduled surgery.  She underwent the procedures as mentioned above, her operation was uncomplicated. For further details about surgery, please refer to the operative report. Patient had an uncomplicated postoperative course. By time of discharge, her pain was controlled on oral pain medications; she was ambulating, voiding without difficulty, tolerating regular diet and passing flatus. She was deemed stable for discharge to home.   Discharge Exam: Blood pressure 129/77, pulse 63, temperature 98.1 F (36.7 C), temperature source Oral, resp. rate 18, height 5\' 9"  (1.753 m), weight 197 lb (89.359 kg), SpO2 96.00%. General appearance: alert and no distress Resp: clear to auscultation bilaterally Cardio: regular rate and rhythm GI: soft, non-tender; bowel sounds normal; no masses,  no organomegaly and incision is clean, dry and intact.  No erythema, drainage or induration Pelvic: deferred Extremities: extremities normal, atraumatic, no cyanosis or edema and Homans sign is negative, no sign of DVT  Discharged Condition: Stale  Disposition: Home  Discharge Orders    Future Appointments: Provider: Department: Dept Phone: Center:     01/15/2012 10:30 AM Tereso Newcomer, MD [facility] [phone] [email or web address]   01/22/2012 11:15 AM Judy Pimple, MD [facility] [phone] [email or web address]     Medication List  As of 12/11/2011 10:10 AM   TAKE these medications         acetaminophen 500 MG tablet   Commonly known as: TYLENOL   Take 500 mg by mouth every 6 (six) hours as needed.      aspirin 81 MG tablet   Take 81 mg by mouth as needed.      cholecalciferol 1000 UNITS tablet   Commonly known as: VITAMIN D   Take 1,000 Units by mouth 2 (two) times daily.      diclofenac 75 MG EC tablet   Commonly known as: VOLTAREN   Take 1 tablet (75 mg total) by mouth 2 (two) times daily with a meal.      docusate sodium 100 MG capsule   Commonly known as: COLACE   Take 1 capsule (100 mg total) by mouth 2 (two) times daily as needed for constipation.      fluticasone 50 MCG/ACT nasal spray   Commonly known as: FLONASE   2 sprays in each nostril once daily as needed      ibuprofen 600 MG tablet   Commonly known as: ADVIL,MOTRIN   Take 1 tablet (600 mg total) by mouth every 6 (six) hours as needed (mild pain).      oxyCODONE-acetaminophen 5-325 MG per tablet   Commonly known as: PERCOCET   Take 1-2 tablets by mouth every 6 (six) hours as needed (moderate to severe pain (when tolerating fluids)).           Follow-up Information    Follow up with [facility] on 01/15/2012. (10:30 am postoperative appointment with Dr. Macon Large)    Contact information:   [phone] [place] [address]          Signed: Tereso Newcomer 12/11/2011, 10:10 AM

## 2011-12-11 NOTE — Progress Notes (Signed)
Pt d/c home  Teaching complete  Out in w/c

## 2012-01-09 ENCOUNTER — Telehealth: Payer: Self-pay

## 2012-01-09 MED ORDER — ATORVASTATIN CALCIUM 10 MG PO TABS: 10.0000 mg | ORAL_TABLET | Freq: Every day | ORAL | Status: DC

## 2012-01-09 NOTE — Telephone Encounter (Signed)
Refill sent for atorvastatin 10 mg take one tablet daily at bedtime.

## 2012-01-15 ENCOUNTER — Encounter: Payer: Self-pay | Admitting: Obstetrics & Gynecology

## 2012-01-15 ENCOUNTER — Ambulatory Visit (INDEPENDENT_AMBULATORY_CARE_PROVIDER_SITE_OTHER): Payer: BC Managed Care – PPO | Admitting: Obstetrics & Gynecology

## 2012-01-15 VITALS — BP 136/93 | HR 93 | Ht 67.0 in | Wt 189.0 lb

## 2012-01-15 DIAGNOSIS — D259 Leiomyoma of uterus, unspecified: Secondary | ICD-10-CM

## 2012-01-15 DIAGNOSIS — Z09 Encounter for follow-up examination after completed treatment for conditions other than malignant neoplasm: Secondary | ICD-10-CM

## 2012-01-15 DIAGNOSIS — N951 Menopausal and female climacteric states: Secondary | ICD-10-CM

## 2012-01-15 MED ORDER — GABAPENTIN 600 MG PO TABS: 600.0000 mg | ORAL_TABLET | Freq: Every day | ORAL | Status: DC

## 2012-01-15 NOTE — Addendum Note (Signed)
Addended by: Jaynie Collins A on: 01/15/2012 11:29 AM   Modules accepted: Orders

## 2012-01-15 NOTE — Progress Notes (Signed)
  Subjective:     Barbara Kaiser is a 56 y.o. female who presents to the clinic after undergoing abdominal supracervical hysterectomy, bilateral salpingo-oophorectomy for fibroids 12/09/11. Eating a regular diet with difficulty. Bowel movements are normal. The patient is not having any pain.  The following portions of the patient's history were reviewed and updated as appropriate: allergies, current medications, past family history, past medical history, past social history, past surgical history and problem list.  Review of Systems A comprehensive review of systems was negative.    Objective:    BP 136/93  Pulse 93  Ht 5\' 7"  (1.702 m)  Wt 189 lb (85.73 kg)  BMI 29.60 kg/m2  LMP 11/28/2011 General:  alert and no distress  Abdomen: soft, bowel sounds active, non-tender  Incision:   healing well, no drainage, no erythema, no hernia, no seroma, no swelling, no dehiscence, incision well approximated Bimanual exam: Mild tenderness on cervical check, no other anomalies     Pathology (12/09/11) UTERINE CORPUS: ENDOMETRIUM: EARLY SECRETORY PHASE ENDOMETRIUM. NO HYPERPLASIA, ATYPIA OR MALIGNANCY IDENTIFIED. MYOMETRIUM: LEIOMYOMATA. NO ATYPIA OR MALIGNANCY IDENTIFIED. BILATERAL ADNEXA: BENIGN OVARIAN AND FALLOPIAN TUBE TISSUE. NO TUMOR SEEN.  Assessment:    Doing well postoperatively. Operative findings again reviewed. Pathology report discussed.    Plan:    1. Continue any current medications. 2. Wound care discussed. 3. Activity restrictions: no lifting more than 15 pounds 4. 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 5. Follow up: 1 year for pap smear.

## 2012-01-15 NOTE — Patient Instructions (Signed)
Return to clinic for any scheduled appointments or for any gynecologic concerns as needed.   

## 2012-01-21 ENCOUNTER — Encounter: Payer: Self-pay | Admitting: *Deleted

## 2012-01-22 ENCOUNTER — Ambulatory Visit: Payer: BC Managed Care – PPO | Admitting: Family Medicine

## 2012-02-14 ENCOUNTER — Ambulatory Visit: Payer: BC Managed Care – PPO | Admitting: Family Medicine

## 2012-02-28 ENCOUNTER — Ambulatory Visit: Payer: BC Managed Care – PPO | Admitting: Family Medicine

## 2012-03-03 ENCOUNTER — Encounter: Payer: Self-pay | Admitting: Family Medicine

## 2012-03-03 ENCOUNTER — Ambulatory Visit (INDEPENDENT_AMBULATORY_CARE_PROVIDER_SITE_OTHER): Payer: BC Managed Care – PPO | Admitting: Family Medicine

## 2012-03-03 VITALS — BP 120/72 | HR 76 | Temp 98.3°F | Resp 16 | Wt 194.2 lb

## 2012-03-03 DIAGNOSIS — R7309 Other abnormal glucose: Secondary | ICD-10-CM

## 2012-03-03 DIAGNOSIS — E785 Hyperlipidemia, unspecified: Secondary | ICD-10-CM

## 2012-03-03 DIAGNOSIS — I10 Essential (primary) hypertension: Secondary | ICD-10-CM

## 2012-03-03 DIAGNOSIS — D649 Anemia, unspecified: Secondary | ICD-10-CM | POA: Insufficient documentation

## 2012-03-03 DIAGNOSIS — R739 Hyperglycemia, unspecified: Secondary | ICD-10-CM

## 2012-03-03 NOTE — Progress Notes (Signed)
Subjective:    Patient ID: Barbara Kaiser, female    DOB: 1956/01/07, 56 y.o.   MRN: [id number]  HPI Here for f/u hyperglycemia and HTN and lipids Feels good overall - just dealing with hot flashes  Is trying black kohash -- unsure if it is helping much  She takes a mvi for women over 50  Wt is up 5 lb-less than expected   She needs to exercise    Hyperglycemia Last a1c was with [facility]  Needs this re checked Ate well for a while - got off track and now getting back on track Is going to get rid of simple sugar and salt  Is going to add more walking and take the steps   Lipid-- was high last time  Last LDL 137 Diet- is fair-no fried foods but loves hamburgers -- also will try [place] burgers Exercise- gettting better   bp is stable today  No cp or palpitations or headaches or edema  No side effects to medicines  BP Readings from Last 3 Encounters:  03/03/12 120/72  01/15/12 136/93  12/11/11 138/84      Had a total supracervical hysterectomy A lot of trouble with hot flashes Had fibroids Feels a lot better overall  Some random pains as things heal - is ok with that - back and low abd  No more peroids  Last check of cbc was in the hospital  Lab Results  Component Value Date   WBC 15.3* 12/10/2011   HGB 11.0* 12/10/2011   HCT 34.4* 12/10/2011   MCV 88.7 12/10/2011   PLT 259 12/10/2011   was on gabapentin for night sweats- did not help   Patient Active Problem List  Diagnosis  . FIBROIDS, UTERUS  . HYPERLIPIDEMIA  . HYPERTENSION, MILD  . ALLERGIC RHINITIS  . ASTHMA  . BREAST MASS, LEFT  . Hyperglycemia  . Vitamin d deficiency  . CAD (coronary artery disease)  . Preoperative cardiovascular examination  . Pelvic pain in female attributed to fibroids   Past Medical History  Diagnosis Date  . Allergic rhinitis   . Anemia, iron deficiency   . Asthma   . Uterine fibroid     with heavy menses and anemia (resolved by ablation)  . Colon polyps 3/10  .  Complication of anesthesia     hard to wake   Past Surgical History  Procedure Date  . Nasal sinus surgery 11/07  . Tubal ligation   . Dilation and curettage of uterus 12/08    hysteroscopy and endometrial ablation  . Cardiac catheterization 05/2005    mild CAD  . Laparoscopic supracervical hysterectomy 12/09/2011    Procedure: LAPAROSCOPIC SUPRACERVICAL HYSTERECTOMY;  Surgeon: Tereso Newcomer, MD;  Location: [facility];  Service: Gynecology;  Laterality: N/A;  . Salpingoophorectomy 12/09/2011    Procedure: SALPINGO OOPHERECTOMY;  Surgeon: Tereso Newcomer, MD;  Location: [facility];  Service: Gynecology;  Laterality: Bilateral;  . Supraclavical abdominal hysterectomy 12/09/2011    Procedure: HYSTERECTOMY SUPRACERVICAL ABDOMINAL;  Surgeon: Tereso Newcomer, MD;  Location: [facility];  Service: Gynecology;  Laterality: N/A;   History  Substance Use Topics  . Smoking status: Former Smoker -- 0.2 packs/day for 24 years    Quit date: 07/01/2001  . Smokeless tobacco: Not on file  . Alcohol Use: No   Family History  Problem Relation Age of Onset  . Hypertension Mother   . Cancer Mother 20    uterine  . Hypertension Father   . Heart  disease Other     MI  . Diabetes Other   . Stroke Other   . Heart disease Brother     age 10 and 69  . Cancer Other     lung   Allergies  Allergen Reactions  . Codeine Nausea And Vomiting   Current Outpatient Prescriptions on File Prior to Visit  Medication Sig Dispense Refill  . aspirin 81 MG tablet Take 81 mg by mouth as needed.      Marland Kitchen atorvastatin (LIPITOR) 10 MG tablet Take 1 tablet (10 mg total) by mouth daily.  90 tablet  3  . diclofenac (VOLTAREN) 75 MG EC tablet Take 1 tablet (75 mg total) by mouth 2 (two) times daily with a meal.  60 tablet  2  . fluticasone (FLONASE) 50 MCG/ACT nasal spray 2 sprays in each nostril once daily as needed  16 g  11     Review of Systems Review of Systems  Constitutional: Negative for fever, appetite change, fatigue  and unexpected weight change.  Eyes: Negative for pain and visual disturbance.  Respiratory: Negative for cough and shortness of breath.   Cardiovascular: Negative for cp or palpitations    Gastrointestinal: Negative for nausea, diarrhea and constipation.  Genitourinary: Negative for urgency and frequency. pos for hot flashes/ night sweats  Skin: Negative for pallor or rash   Neurological: Negative for weakness, light-headedness, numbness and headaches.  Hematological: Negative for adenopathy. Does not bruise/bleed easily.  Psychiatric/Behavioral: Negative for dysphoric mood. The patient is not nervous/anxious.         Objective:   Physical Exam  Constitutional: She appears well-developed and well-nourished. No distress.       obese and well appearing   HENT:  Head: Normocephalic and atraumatic.  Mouth/Throat: Oropharynx is clear and moist.  Eyes: Conjunctivae and EOM are normal. Pupils are equal, round, and reactive to light. No scleral icterus.  Neck: Normal range of motion. Neck supple. No JVD present. Carotid bruit is not present. No thyromegaly present.  Cardiovascular: Normal rate, regular rhythm, normal heart sounds and intact distal pulses.  Exam reveals no gallop.   Pulmonary/Chest: Effort normal and breath sounds normal. No respiratory distress. She has no wheezes. She exhibits no tenderness.  Abdominal: Soft. Bowel sounds are normal. She exhibits no distension, no abdominal bruit and no mass. There is no tenderness.  Musculoskeletal: She exhibits no edema and no tenderness.  Lymphadenopathy:    She has no cervical adenopathy.  Neurological: She is alert. She has normal reflexes. No cranial nerve deficit. She exhibits normal muscle tone. Coordination normal.  Skin: Skin is warm and dry. No rash noted. No erythema. No pallor.  Psychiatric: She has a normal mood and affect.          Assessment & Plan:

## 2012-03-03 NOTE — Patient Instructions (Addendum)
Labs today Work hard on a low sugar low fat diet  Also begin exercise -working up to 30 minutes 5 days a week  Keep drinking lots of water

## 2012-03-05 LAB — CBC WITH DIFFERENTIAL/PLATELET
Basos: 0 % (ref 0–3)
Eos: 3 % (ref 0–7)
Eosinophils Absolute: 0.2 10*3/uL (ref 0.0–0.4)
HCT: 41.2 % (ref 34.0–46.6)
Hemoglobin: 13.6 g/dL (ref 11.1–15.9)
MCH: 28.3 pg (ref 26.6–33.0)
MCV: 86 fL (ref 79–97)
Monocytes Absolute: 0.4 10*3/uL (ref 0.1–1.0)
Neutrophils Absolute: 3.8 10*3/uL (ref 1.8–7.8)
RBC: 4.8 x10E6/uL (ref 3.77–5.28)
WBC: 7.6 10*3/uL (ref 4.0–10.5)

## 2012-03-05 LAB — LIPID PANEL
Chol/HDL Ratio: 6.8 ratio units — ABNORMAL HIGH (ref 0.0–4.4)
Cholesterol, Total: 238 mg/dL — ABNORMAL HIGH (ref 100–199)
HDL: 35 mg/dL — ABNORMAL LOW (ref >39)
LDL Calculated: 145 mg/dL — ABNORMAL HIGH (ref 0–99)
Triglycerides: 291 mg/dL — ABNORMAL HIGH (ref 0–149)

## 2012-03-05 LAB — COMPREHENSIVE METABOLIC PANEL
ALT: 17 IU/L (ref 0–32)
AST: 17 IU/L (ref 0–40)
Albumin/Globulin Ratio: 1.8 (ref 1.1–2.5)
Alkaline Phosphatase: 87 IU/L (ref 42–107)
BUN/Creatinine Ratio: 19 (ref 9–23)
Calcium: 10.2 mg/dL (ref 8.7–10.2)
Creatinine, Ser: 0.74 mg/dL (ref 0.57–1.00)
GFR calc non Af Amer: 93 mL/min/{1.73_m2} (ref >59)
Globulin, Total: 2.5 g/dL (ref 1.5–4.5)
Potassium: 4.5 mmol/L (ref 3.5–5.2)
Sodium: 142 mmol/L (ref 134–144)

## 2012-03-05 NOTE — Assessment & Plan Note (Signed)
a1c today  Disc diet  Had hyst -now craves sugar more  Only 5 lb wt gain, however Rev low glycemic diet

## 2012-03-05 NOTE — Assessment & Plan Note (Signed)
Re check this - from her surgery Is not on iron

## 2012-03-05 NOTE — Assessment & Plan Note (Signed)
bp in fair control at this time  No changes needed  Disc lifstyle change with low sodium diet and exercise   Labs today 

## 2012-03-05 NOTE — Assessment & Plan Note (Signed)
Prev mildly high Diet is fair/ could be better  Lab today  Low sat fat diet discussed

## 2012-07-28 ENCOUNTER — Telehealth: Payer: Self-pay | Admitting: Family Medicine

## 2012-07-28 NOTE — Telephone Encounter (Signed)
Go ahead and put her in any 30 min for PE   (not today or tomorrow due to weather however)  She gets mammo at [facility]- and she should not need ref (even if her letter says so)- we now have pt's scheduling their own

## 2012-07-28 NOTE — Telephone Encounter (Signed)
appts rescheduled in Feb. and pt advise to call [facility] to make own mammo appt but to call back if she has any problems

## 2012-07-28 NOTE — Telephone Encounter (Signed)
Pt is due a CPE around 08/20/2012, however the 1st thing you have available is not until 11/30/2012. The pt needs to have a mammogram prior to June and needs a referral from here. Can you accommodate a CPE prior to 11/30/2012? Thank you.

## 2012-08-16 ENCOUNTER — Telehealth: Payer: Self-pay | Admitting: Family Medicine

## 2012-08-16 DIAGNOSIS — E559 Vitamin D deficiency, unspecified: Secondary | ICD-10-CM

## 2012-08-16 DIAGNOSIS — R739 Hyperglycemia, unspecified: Secondary | ICD-10-CM

## 2012-08-16 DIAGNOSIS — E785 Hyperlipidemia, unspecified: Secondary | ICD-10-CM

## 2012-08-16 DIAGNOSIS — Z0000 Encounter for general adult medical examination without abnormal findings: Secondary | ICD-10-CM | POA: Insufficient documentation

## 2012-08-16 NOTE — Telephone Encounter (Signed)
Message copied by Judy Pimple on Sun Aug 16, 2012 11:19 AM ------      Message from: Alvina Chou      Created: Tue Aug 11, 2012 12:15 PM      Regarding: Lab orders for Monday, 2.17.14       Patient is scheduled for CPX labs, please order future labs, Thanks , Camelia Eng                  Some orders are in, wasn't sure if you wanted to add any. ------

## 2012-08-17 ENCOUNTER — Other Ambulatory Visit (INDEPENDENT_AMBULATORY_CARE_PROVIDER_SITE_OTHER): Payer: BC Managed Care – PPO

## 2012-08-17 DIAGNOSIS — Z0000 Encounter for general adult medical examination without abnormal findings: Secondary | ICD-10-CM

## 2012-08-17 DIAGNOSIS — R7309 Other abnormal glucose: Secondary | ICD-10-CM

## 2012-08-17 DIAGNOSIS — E785 Hyperlipidemia, unspecified: Secondary | ICD-10-CM

## 2012-08-17 DIAGNOSIS — R739 Hyperglycemia, unspecified: Secondary | ICD-10-CM

## 2012-08-17 LAB — CBC WITH DIFFERENTIAL/PLATELET
Basophils Absolute: 0 10*3/uL (ref 0.0–0.1)
Basophils Relative: 0.5 % (ref 0.0–3.0)
Eosinophils Relative: 2.7 % (ref 0.0–5.0)
HCT: 42.1 % (ref 36.0–46.0)
Hemoglobin: 13.7 g/dL (ref 12.0–15.0)
Lymphocytes Relative: 28.1 % (ref 12.0–46.0)
Lymphs Abs: 2 10*3/uL (ref 0.7–4.0)
Monocytes Relative: 5.5 % (ref 3.0–12.0)
Neutro Abs: 4.5 10*3/uL (ref 1.4–7.7)
RBC: 4.87 Mil/uL (ref 3.87–5.11)
WBC: 7.2 10*3/uL (ref 4.5–10.5)

## 2012-08-17 LAB — COMPREHENSIVE METABOLIC PANEL
ALT: 17 U/L (ref 0–35)
BUN: 10 mg/dL (ref 6–23)
CO2: 28 mEq/L (ref 19–32)
Calcium: 9.9 mg/dL (ref 8.4–10.5)
Chloride: 107 mEq/L (ref 96–112)
Creatinine, Ser: 0.7 mg/dL (ref 0.4–1.2)
GFR: 112.03 mL/min (ref >60.00)
Glucose, Bld: 116 mg/dL — ABNORMAL HIGH (ref 70–99)
Total Bilirubin: 0.4 mg/dL (ref 0.3–1.2)

## 2012-08-17 LAB — TSH: TSH: 1.49 u[IU]/mL (ref 0.35–5.50)

## 2012-08-17 LAB — LIPID PANEL
Cholesterol: 224 mg/dL — ABNORMAL HIGH (ref 0–200)
Total CHOL/HDL Ratio: 6
VLDL: 24 mg/dL (ref 0.0–40.0)

## 2012-08-19 ENCOUNTER — Encounter: Payer: Self-pay | Admitting: Family Medicine

## 2012-08-19 ENCOUNTER — Ambulatory Visit (INDEPENDENT_AMBULATORY_CARE_PROVIDER_SITE_OTHER): Payer: BC Managed Care – PPO | Admitting: Family Medicine

## 2012-08-19 VITALS — BP 130/78 | HR 82 | Temp 98.4°F | Ht 67.0 in | Wt 195.0 lb

## 2012-08-19 DIAGNOSIS — Z0000 Encounter for general adult medical examination without abnormal findings: Secondary | ICD-10-CM

## 2012-08-19 DIAGNOSIS — I10 Essential (primary) hypertension: Secondary | ICD-10-CM

## 2012-08-19 DIAGNOSIS — E785 Hyperlipidemia, unspecified: Secondary | ICD-10-CM

## 2012-08-19 DIAGNOSIS — E559 Vitamin D deficiency, unspecified: Secondary | ICD-10-CM

## 2012-08-19 DIAGNOSIS — R7309 Other abnormal glucose: Secondary | ICD-10-CM

## 2012-08-19 DIAGNOSIS — R739 Hyperglycemia, unspecified: Secondary | ICD-10-CM

## 2012-08-19 MED ORDER — FLUTICASONE PROPIONATE 50 MCG/ACT NA SUSP: NASAL | Status: DC

## 2012-08-19 NOTE — Assessment & Plan Note (Signed)
Chol is high  On statin in the past Disc goals for lipids and reasons to control them Rev labs with pt Rev low sat fat diet in detail Diet info given Re check 6 mo and f/u

## 2012-08-19 NOTE — Assessment & Plan Note (Signed)
D level is ok  Will continue current suppl

## 2012-08-19 NOTE — Patient Instructions (Addendum)
See your gyn doctor this summer as planned  Schedule your own mammogram  Please check with Dr Barbara Kaiser office to see when next colonoscopy is due  Schedule follow up in 6 months with labs prior for a1c and cholesterol  Work on weight loss Low sugar diet and also low diet  Avoid red meat/ fried foods/ egg yolks/ fatty breakfast meats/ butter, cheese and high fat dairy/ and shellfish   Aim for 5 days of exercise per week (30 minutes at a time) indoors or out

## 2012-08-19 NOTE — Assessment & Plan Note (Signed)
Sugar up a bit - pre diabetic and obese Also recent prednisone Disc plan for diet/ exercise and wt loss Handouts given F/u 6 mo

## 2012-08-19 NOTE — Assessment & Plan Note (Signed)
bp in fair control at this time  No changes needed  Disc lifstyle change with low sodium diet and exercise  Labs reviewed  

## 2012-08-19 NOTE — Assessment & Plan Note (Signed)
Reviewed health habits including diet and exercise and skin cancer prevention Also reviewed health mt list, fam hx and immunizations  Rev wellness labs Will find out when colonosc is due Pt will sched mammo and gyn visit

## 2012-08-19 NOTE — Progress Notes (Signed)
Subjective:    Patient ID: Barbara Kaiser, female    DOB: [birth year], 57 y.o.   MRN: [id number]  HPI Here for health maintenance exam and to review chronic medical problems    Is doing well - but having back problems  Sees chiropractor  Was on a short course of prednisone    Wt is stable with bmi of 30  Flu vaccine - got it at work   mammo 2/13- she needs to schedule that  Self exam-no lumps   Pap 2/13 Gyn will re check her in july Had supra cervical hyst for fibroids- happy to not having periods  Still having hot flashes- but they are getting better - glad for that  Lab Results  Component Value Date   WBC 7.2 08/17/2012   HGB 13.7 08/17/2012   HCT 42.1 08/17/2012   MCV 86.4 08/17/2012   PLT 226.0 08/17/2012    Hyperglycemia  Lab Results  Component Value Date   HGBA1C 5.9 08/17/2012  eats oatmeal  Drinks some regular sodas, and sweet tea  Not watching her diet  Needs to start back walking    colonosc 3/10- polyps - needs to find out when f/u due   Hyperlipidemia Lab Results  Component Value Date   CHOL 224* 08/17/2012   CHOL 190 06/26/2010   CHOL 194 06/21/2009   Lab Results  Component Value Date   HDL 34.70* 08/17/2012   HDL 35* 03/03/2012   HDL 31.50* 06/26/2010   Lab Results  Component Value Date   LDLCALC 145* 03/03/2012   LDLCALC 128* 06/26/2010   LDLCALC 125* 06/21/2009   Lab Results  Component Value Date   TRIG 120.0 08/17/2012   TRIG 291* 03/03/2012   TRIG 151.0* 06/26/2010   Lab Results  Component Value Date   CHOLHDL 6 08/17/2012   CHOLHDL 6.8* 03/03/2012   CHOLHDL 6 06/26/2010   Lab Results  Component Value Date   LDLDIRECT 162.1 08/17/2012   LDLDIRECT 155.0 03/29/2008    Hx of low D D level is 40 now   Patient Active Problem List  Diagnosis  . HYPERLIPIDEMIA  . HYPERTENSION, MILD  . ALLERGIC RHINITIS  . ASTHMA  . Hyperglycemia  . Unspecified vitamin D deficiency  . CAD (coronary artery disease)  . Preoperative cardiovascular  examination  . Routine general medical examination at a health care facility   Past Medical History  Diagnosis Date  . Allergic rhinitis   . Anemia, iron deficiency   . Asthma   . Uterine fibroid     with heavy menses and anemia (resolved by ablation)  . Colon polyps 3/10  . Complication of anesthesia     hard to wake   Past Surgical History  Procedure Laterality Date  . Nasal sinus surgery  11/07  . Tubal ligation    . Dilation and curettage of uterus  12/08    hysteroscopy and endometrial ablation  . Cardiac catheterization  05/2005    mild CAD  . Laparoscopic supracervical hysterectomy  12/09/2011    Procedure: LAPAROSCOPIC SUPRACERVICAL HYSTERECTOMY;  Surgeon: Tereso Newcomer, MD;  Location: [facility];  Service: Gynecology;  Laterality: N/A;  . Salpingoophorectomy  12/09/2011    Procedure: SALPINGO OOPHERECTOMY;  Surgeon: Tereso Newcomer, MD;  Location: [facility];  Service: Gynecology;  Laterality: Bilateral;  . Supraclavical abdominal hysterectomy  12/09/2011    Procedure: HYSTERECTOMY SUPRACERVICAL ABDOMINAL;  Surgeon: Tereso Newcomer, MD;  Location: [facility];  Service: Gynecology;  Laterality: N/A;  History  Substance Use Topics  . Smoking status: Former Smoker -- 0.25 packs/day for 24 years    Quit date: 07/01/2001  . Smokeless tobacco: Not on file  . Alcohol Use: No   Family History  Problem Relation Age of Onset  . Hypertension Mother   . Cancer Mother 20    uterine  . Hypertension Father   . Heart disease Other     MI  . Diabetes Other   . Stroke Other   . Heart disease Brother     age 21 and 19  . Cancer Other     lung   Allergies  Allergen Reactions  . Codeine Nausea And Vomiting   Current Outpatient Prescriptions on File Prior to Visit  Medication Sig Dispense Refill  . aspirin 81 MG tablet Take 81 mg by mouth as needed.      . diclofenac (VOLTAREN) 75 MG EC tablet Take 1 tablet (75 mg total) by mouth 2 (two) times daily with a meal.  60 tablet  2  .  Multiple Vitamin (MULTIVITAMIN) tablet Take 1 tablet by mouth daily.       No current facility-administered medications on file prior to visit.    Review of Systems Review of Systems  Constitutional: Negative for fever, appetite change, fatigue and unexpected weight change.  Eyes: Negative for pain and visual disturbance.  Respiratory: Negative for cough and shortness of breath.   Cardiovascular: Negative for cp or palpitations    Gastrointestinal: Negative for nausea, diarrhea and constipation.  Genitourinary: Negative for urgency and frequency.  Skin: Negative for pallor or rash   MSK pos for low back pain that is improving  Neurological: Negative for weakness, light-headedness, numbness and headaches.  Hematological: Negative for adenopathy. Does not bruise/bleed easily.  Psychiatric/Behavioral: Negative for dysphoric mood. The patient is not nervous/anxious.         Objective:   Physical Exam  Constitutional: She appears well-developed and well-nourished. No distress.  HENT:  Head: Normocephalic and atraumatic.  Right Ear: External ear normal.  Left Ear: External ear normal.  Nose: Nose normal.  Mouth/Throat: Oropharynx is clear and moist.  Eyes: Conjunctivae and EOM are normal. Pupils are equal, round, and reactive to light. Right eye exhibits no discharge. Left eye exhibits no discharge. No scleral icterus.  Neck: Normal range of motion. Neck supple. No JVD present. Carotid bruit is not present. No thyromegaly present.  Cardiovascular: Normal rate, regular rhythm, normal heart sounds and intact distal pulses.  Exam reveals no gallop.   Pulmonary/Chest: Effort normal and breath sounds normal. No respiratory distress. She has no wheezes.  Abdominal: Soft. Bowel sounds are normal. She exhibits no distension, no abdominal bruit and no mass. There is no tenderness.  Musculoskeletal: She exhibits no edema and no tenderness.  Lymphadenopathy:    She has no cervical adenopathy.   Neurological: She is alert. She has normal reflexes. No cranial nerve deficit. She exhibits normal muscle tone. Coordination normal.  Skin: Skin is warm and dry. No rash noted. No erythema. No pallor.  Psychiatric: She has a normal mood and affect.          Assessment & Plan:

## 2012-10-13 ENCOUNTER — Ambulatory Visit: Payer: Self-pay | Admitting: Family Medicine

## 2012-10-14 ENCOUNTER — Encounter: Payer: Self-pay | Admitting: Family Medicine

## 2012-10-15 ENCOUNTER — Encounter: Payer: Self-pay | Admitting: *Deleted

## 2012-11-24 ENCOUNTER — Other Ambulatory Visit: Payer: BC Managed Care – PPO

## 2012-11-30 ENCOUNTER — Encounter: Payer: BC Managed Care – PPO | Admitting: Family Medicine

## 2013-02-09 ENCOUNTER — Other Ambulatory Visit (INDEPENDENT_AMBULATORY_CARE_PROVIDER_SITE_OTHER): Payer: BC Managed Care – PPO

## 2013-02-09 DIAGNOSIS — E559 Vitamin D deficiency, unspecified: Secondary | ICD-10-CM

## 2013-02-09 DIAGNOSIS — R739 Hyperglycemia, unspecified: Secondary | ICD-10-CM

## 2013-02-09 DIAGNOSIS — E785 Hyperlipidemia, unspecified: Secondary | ICD-10-CM

## 2013-02-09 DIAGNOSIS — R7309 Other abnormal glucose: Secondary | ICD-10-CM

## 2013-02-10 LAB — HEMOGLOBIN A1C: Hgb A1c MFr Bld: 5.8 % (ref 4.6–6.5)

## 2013-02-10 LAB — VITAMIN D 25 HYDROXY (VIT D DEFICIENCY, FRACTURES): Vit D, 25-Hydroxy: 35 ng/mL (ref 30–89)

## 2013-02-10 LAB — LIPID PANEL: HDL: 33 mg/dL — ABNORMAL LOW (ref >39.00)

## 2013-02-11 LAB — LDL CHOLESTEROL, DIRECT: Direct LDL: 158.6 mg/dL

## 2013-02-16 ENCOUNTER — Ambulatory Visit: Payer: BC Managed Care – PPO | Admitting: Family Medicine

## 2013-03-05 ENCOUNTER — Encounter: Payer: Self-pay | Admitting: Family Medicine

## 2013-03-05 ENCOUNTER — Ambulatory Visit (INDEPENDENT_AMBULATORY_CARE_PROVIDER_SITE_OTHER): Payer: BC Managed Care – PPO | Admitting: Family Medicine

## 2013-03-05 VITALS — BP 140/82 | HR 72 | Temp 98.3°F | Ht 67.0 in | Wt 194.2 lb

## 2013-03-05 DIAGNOSIS — R739 Hyperglycemia, unspecified: Secondary | ICD-10-CM

## 2013-03-05 DIAGNOSIS — E785 Hyperlipidemia, unspecified: Secondary | ICD-10-CM

## 2013-03-05 DIAGNOSIS — R7309 Other abnormal glucose: Secondary | ICD-10-CM

## 2013-03-05 DIAGNOSIS — I10 Essential (primary) hypertension: Secondary | ICD-10-CM

## 2013-03-05 MED ORDER — SIMVASTATIN 20 MG PO TABS: 20.0000 mg | ORAL_TABLET | Freq: Every day | ORAL | Status: DC

## 2013-03-05 NOTE — Progress Notes (Signed)
Subjective:    Patient ID: Barbara Kaiser, female    DOB: 08-20-1955, 57 y.o.   MRN: [id number]  HPI Here for f/u of chronic medical problems   Doing ok - just some stiffness of joints / and has a slipped disc in her back  Active job with walking    Wt is stable  Obese  Hyperlipidemia Lab Results  Component Value Date   CHOL 210* 02/09/2013   CHOL 224* 08/17/2012   CHOL 190 06/26/2010   Lab Results  Component Value Date   HDL 33.00* 02/09/2013   HDL 34.70* 08/17/2012   HDL 35* 03/03/2012   Lab Results  Component Value Date   LDLCALC 145* 03/03/2012   LDLCALC 128* 06/26/2010   LDLCALC 125* 06/21/2009   Lab Results  Component Value Date   TRIG 187.0* 02/09/2013   TRIG 120.0 08/17/2012   TRIG 291* 03/03/2012   Lab Results  Component Value Date   CHOLHDL 6 02/09/2013   CHOLHDL 6 08/17/2012   CHOLHDL 6.8* 03/03/2012   Lab Results  Component Value Date   LDLDIRECT 158.6 02/09/2013   LDLDIRECT 162.1 08/17/2012   LDLDIRECT 155.0 03/29/2008   worse with age  Has not made much diet change  She does eat fast food/ red meat/ fried food  Is open to medication  In the past - generic lipitor made her dizzy    bp is stable today  No cp or palpitations or headaches or edema  No side effects to medicines  BP Readings from Last 3 Encounters:  03/05/13 146/84  08/19/12 130/78  03/03/12 120/72       Hyperglycemia Lab Results  Component Value Date   HGBA1C 5.8 02/09/2013   this is down from 5.9 Is pre diabetic   Patient Active Problem List   Diagnosis Date Noted  . Routine general medical examination at a health care facility 08/16/2012  . CAD (coronary artery disease) 12/05/2011  . Preoperative cardiovascular examination 12/05/2011  . Hyperglycemia 08/20/2011  . Unspecified vitamin D deficiency 08/20/2011  . HYPERLIPIDEMIA 06/21/2009  . HYPERTENSION, MILD 02/13/2007  . ALLERGIC RHINITIS 02/13/2007  . ASTHMA 02/13/2007   Past Medical History  Diagnosis Date  . Allergic  rhinitis   . Anemia, iron deficiency   . Asthma   . Uterine fibroid     with heavy menses and anemia (resolved by ablation)  . Colon polyps 3/10  . Complication of anesthesia     hard to wake   Past Surgical History  Procedure Laterality Date  . Nasal sinus surgery  11/07  . Tubal ligation    . Dilation and curettage of uterus  12/08    hysteroscopy and endometrial ablation  . Cardiac catheterization  05/2005    mild CAD  . Laparoscopic supracervical hysterectomy  12/09/2011    Procedure: LAPAROSCOPIC SUPRACERVICAL HYSTERECTOMY;  Surgeon: Tereso Newcomer, MD;  Location: [facility];  Service: Gynecology;  Laterality: N/A;  . Salpingoophorectomy  12/09/2011    Procedure: SALPINGO OOPHERECTOMY;  Surgeon: Tereso Newcomer, MD;  Location: [facility];  Service: Gynecology;  Laterality: Bilateral;  . Supracervical abdominal hysterectomy  12/09/2011    Procedure: HYSTERECTOMY SUPRACERVICAL ABDOMINAL;  Surgeon: Tereso Newcomer, MD;  Location: [facility];  Service: Gynecology;  Laterality: N/A;   History  Substance Use Topics  . Smoking status: Former Smoker -- 0.25 packs/day for 24 years    Quit date: 07/01/2001  . Smokeless tobacco: Not on file  . Alcohol Use: No   Family  History  Problem Relation Age of Onset  . Hypertension Mother   . Cancer Mother 20    uterine  . Hypertension Father   . Heart disease Other     MI  . Diabetes Other   . Stroke Other   . Heart disease Brother     age 33 and 63  . Cancer Other     lung   Allergies  Allergen Reactions  . Codeine Nausea And Vomiting   Current Outpatient Prescriptions on File Prior to Visit  Medication Sig Dispense Refill  . aspirin 81 MG tablet Take 81 mg by mouth as needed.      . fluticasone (FLONASE) 50 MCG/ACT nasal spray 2 sprays in each nostril once daily as needed  16 g  11  . Multiple Vitamin (MULTIVITAMIN) tablet Take 1 tablet by mouth daily.       No current facility-administered medications on file prior to visit.     Review of Systems Review of Systems  Constitutional: Negative for fever, appetite change, fatigue and unexpected weight change.  Eyes: Negative for pain and visual disturbance.  Respiratory: Negative for cough and shortness of breath.   Cardiovascular: Negative for cp or palpitations    Gastrointestinal: Negative for nausea, diarrhea and constipation.  Genitourinary: Negative for urgency and frequency.  Skin: Negative for pallor or rash   Neurological: Negative for weakness, light-headedness, numbness and headaches.  Hematological: Negative for adenopathy. Does not bruise/bleed easily.  Psychiatric/Behavioral: Negative for dysphoric mood. The patient is not nervous/anxious.         Objective:   Physical Exam  Constitutional: She appears well-developed and well-nourished. No distress.  overwt and well app  HENT:  Head: Normocephalic and atraumatic.  Mouth/Throat: Oropharynx is clear and moist.  Eyes: Conjunctivae and EOM are normal. Pupils are equal, round, and reactive to light. Right eye exhibits no discharge. Left eye exhibits no discharge. No scleral icterus.  Neck: Normal range of motion. Neck supple. No JVD present. Carotid bruit is not present. No thyromegaly present.  Cardiovascular: Normal rate, regular rhythm, normal heart sounds and intact distal pulses.  Exam reveals no gallop.   Pulmonary/Chest: Effort normal and breath sounds normal. No respiratory distress. She has no wheezes. She exhibits no tenderness.  Abdominal: Soft. Bowel sounds are normal. She exhibits no distension, no abdominal bruit and no mass. There is no tenderness.  Musculoskeletal: She exhibits no edema and no tenderness.  Lymphadenopathy:    She has no cervical adenopathy.  Neurological: She is alert. She has normal reflexes. No cranial nerve deficit. She exhibits normal muscle tone. Coordination normal.  Skin: Skin is warm and dry. No rash noted. No erythema. No pallor.  Psychiatric: She has a normal  mood and affect.          Assessment & Plan:

## 2013-03-05 NOTE — Patient Instructions (Signed)
Start simvastatin for cholesterol  If it gives you any side effects please let me know  Avoid red meat/ fried foods/ egg yolks/ fatty breakfast meats/ butter, cheese and high fat dairy/ and shellfish   Also work on weight loss with diet and exercise Don't forget your flu shot at work  Schedule fasting lab in 6 weeks for cholesterol

## 2013-03-05 NOTE — Assessment & Plan Note (Signed)
bp in fair control at this time  No changes needed  Disc lifstyle change with low sodium diet and exercise  Better on 2nd check Aware wt loss would help further

## 2013-03-07 NOTE — Assessment & Plan Note (Signed)
Intol of lipitor  Will try low dose zocor and update Getting worse with age Disc goals for lipids and reasons to control them Rev labs with pt Rev low sat fat diet in detail  Re check 6 wk after starting medicine

## 2013-03-07 NOTE — Assessment & Plan Note (Signed)
Lab Results  Component Value Date   HGBA1C 5.8 02/09/2013   Disc need for low glycemic diet Will continue to follow

## 2013-04-19 ENCOUNTER — Other Ambulatory Visit: Payer: BC Managed Care – PPO

## 2013-08-18 ENCOUNTER — Ambulatory Visit: Payer: BC Managed Care – PPO | Admitting: Family Medicine

## 2013-08-20 ENCOUNTER — Telehealth: Payer: Self-pay | Admitting: *Deleted

## 2013-08-20 NOTE — Telephone Encounter (Signed)
Received labs from [facility] and Dr. Glori Bickers wanted me to check with pt to see how much vit. D pt is taking.   Called pt and pt said she isn't taking any vit D so she wanted to know how much should she take, please advise   Lab results placed back in your inbox

## 2013-08-20 NOTE — Telephone Encounter (Signed)
Pt notified to take 2000 iu of Vit D OTC, pt verbalized understanding

## 2013-08-20 NOTE — Telephone Encounter (Signed)
Please get 2000 iu vit D over the counter - take 2000 iu daily for bone health

## 2013-08-25 ENCOUNTER — Ambulatory Visit: Payer: BC Managed Care – PPO | Admitting: Family Medicine

## 2013-09-03 ENCOUNTER — Ambulatory Visit: Payer: BC Managed Care – PPO | Admitting: Family Medicine

## 2013-09-03 DIAGNOSIS — Z0289 Encounter for other administrative examinations: Secondary | ICD-10-CM

## 2013-09-13 ENCOUNTER — Other Ambulatory Visit: Payer: Self-pay | Admitting: Family Medicine

## 2013-12-06 ENCOUNTER — Telehealth: Payer: Self-pay | Admitting: Family Medicine

## 2013-12-06 ENCOUNTER — Ambulatory Visit (INDEPENDENT_AMBULATORY_CARE_PROVIDER_SITE_OTHER): Payer: BC Managed Care – PPO | Admitting: Family Medicine

## 2013-12-06 ENCOUNTER — Encounter: Payer: Self-pay | Admitting: Family Medicine

## 2013-12-06 VITALS — BP 136/84 | HR 78 | Temp 98.1°F | Ht 67.75 in | Wt 197.0 lb

## 2013-12-06 DIAGNOSIS — Z0000 Encounter for general adult medical examination without abnormal findings: Secondary | ICD-10-CM

## 2013-12-06 DIAGNOSIS — E669 Obesity, unspecified: Secondary | ICD-10-CM

## 2013-12-06 DIAGNOSIS — I10 Essential (primary) hypertension: Secondary | ICD-10-CM

## 2013-12-06 DIAGNOSIS — Z860100 Personal history of colon polyps, unspecified: Secondary | ICD-10-CM | POA: Insufficient documentation

## 2013-12-06 DIAGNOSIS — E559 Vitamin D deficiency, unspecified: Secondary | ICD-10-CM

## 2013-12-06 DIAGNOSIS — E785 Hyperlipidemia, unspecified: Secondary | ICD-10-CM

## 2013-12-06 DIAGNOSIS — R739 Hyperglycemia, unspecified: Secondary | ICD-10-CM

## 2013-12-06 DIAGNOSIS — R7309 Other abnormal glucose: Secondary | ICD-10-CM

## 2013-12-06 LAB — CBC WITH DIFFERENTIAL/PLATELET
Basophils Absolute: 0 10*3/uL (ref 0.0–0.1)
Basophils Relative: 0.6 % (ref 0.0–3.0)
EOS ABS: 0.3 10*3/uL (ref 0.0–0.7)
Eosinophils Relative: 4.2 % (ref 0.0–5.0)
HEMATOCRIT: 40.8 % (ref 36.0–46.0)
Hemoglobin: 13.3 g/dL (ref 12.0–15.0)
LYMPHS ABS: 2.7 10*3/uL (ref 0.7–4.0)
Lymphocytes Relative: 35.9 % (ref 12.0–46.0)
MCHC: 32.5 g/dL (ref 30.0–36.0)
MCV: 86.6 fl (ref 78.0–100.0)
MONO ABS: 0.4 10*3/uL (ref 0.1–1.0)
Monocytes Relative: 5.2 % (ref 3.0–12.0)
NEUTROS PCT: 54.1 % (ref 43.0–77.0)
Neutro Abs: 4.1 10*3/uL (ref 1.4–7.7)
PLATELETS: 258 10*3/uL (ref 150.0–400.0)
RBC: 4.72 Mil/uL (ref 3.87–5.11)
RDW: 13.4 % (ref 11.5–15.5)
WBC: 7.6 10*3/uL (ref 4.0–10.5)

## 2013-12-06 LAB — HEMOGLOBIN A1C: HEMOGLOBIN A1C: 5.9 % (ref 4.6–6.5)

## 2013-12-06 MED ORDER — ERGOCALCIFEROL 1.25 MG (50000 UT) PO CAPS: 50000.0000 [IU] | ORAL_CAPSULE | ORAL | Status: DC

## 2013-12-06 NOTE — Assessment & Plan Note (Addendum)
bp in fair control at this time  BP Readings from Last 1 Encounters:  12/06/13 136/84   No changes needed- will watch closely /controlled with lifestyle  Disc lifstyle change with low sodium diet and exercise   Lab today

## 2013-12-06 NOTE — Assessment & Plan Note (Signed)
A1C today  Disc need for wt loss

## 2013-12-06 NOTE — Progress Notes (Signed)
Subjective:    Patient ID: Barbara Kaiser, female    DOB: May 03, 1956, 58 y.o.   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 Personal history of colonic polyps 12/06/2013  . Obesity 12/06/2013  . Routine general medical examination at a health care facility 08/16/2012  . CAD (coronary artery disease) 12/05/2011  . Preoperative cardiovascular examination 12/05/2011  . Hyperglycemia 08/20/2011  . Unspecified vitamin D deficiency 08/20/2011  . HYPERLIPIDEMIA 06/21/2009  . HYPERTENSION, MILD 02/13/2007  . ALLERGIC RHINITIS 02/13/2007  . ASTHMA 02/13/2007   Past Medical History  Diagnosis Date  . Allergic rhinitis   . Anemia, iron deficiency   . Asthma   . Uterine fibroid     with heavy menses and anemia (resolved by ablation)  . Colon polyps 3/10  . Complication of anesthesia     hard to wake   Past Surgical History  Procedure Laterality Date  . Nasal sinus surgery  11/07  . Tubal ligation    . Dilation and curettage of uterus  12/08    hysteroscopy and endometrial ablation  . Cardiac catheterization  05/2005    mild  CAD  . Laparoscopic supracervical hysterectomy  12/09/2011    Procedure: LAPAROSCOPIC SUPRACERVICAL HYSTERECTOMY;  Surgeon: Osborne Oman, MD;  Location: [facility];  Service: Gynecology;  Laterality: N/A;  . Salpingoophorectomy  12/09/2011    Procedure: SALPINGO OOPHERECTOMY;  Surgeon: Osborne Oman, MD;  Location: [facility];  Service: Gynecology;  Laterality: Bilateral;  . Supracervical abdominal hysterectomy  12/09/2011    Procedure: HYSTERECTOMY SUPRACERVICAL ABDOMINAL;  Surgeon: Osborne Oman, MD;  Location: [facility];  Service: Gynecology;  Laterality: N/A;   History  Substance Use Topics  . Smoking status: Former Smoker -- 0.25 packs/day for 24 years    Quit date: 07/01/2001  . Smokeless tobacco: Not on file  . Alcohol Use: No   Family History  Problem Relation Age of Onset  . Hypertension Mother   . Cancer Mother 20    uterine  . Hypertension Father   . Heart disease Other     MI  . Diabetes Other   . Stroke Other   . Heart disease Brother     age 31 and 43  . Cancer Other     lung   Allergies  Allergen Reactions  . Codeine Nausea And Vomiting   Current Outpatient Prescriptions on  File Prior to Visit  Medication Sig Dispense Refill  . aspirin 81 MG tablet Take 81 mg by mouth as needed.      . fluticasone (FLONASE) 50 MCG/ACT nasal spray INHALE 2 SPRAYS IN EACH NOSTRIL ONCE DAILY AS NEEDED  16 g  3  . Multiple Vitamin (MULTIVITAMIN) tablet Take 1 tablet by mouth daily.      . simvastatin (ZOCOR) 20 MG tablet Take 1 tablet (20 mg total) by mouth at bedtime.  30 tablet  11   No current facility-administered medications on file prior to visit.    Review of Systems Review of Systems  Constitutional: Negative for fever, appetite change, fatigue and unexpected weight change.  Eyes: Negative for pain and visual disturbance.  Respiratory: Negative for cough and shortness of breath.   Cardiovascular: Negative for cp or palpitations    Gastrointestinal: Negative for nausea,  diarrhea and constipation.  Genitourinary: Negative for urgency and frequency.  Skin: Negative for pallor or rash   Neurological: Negative for weakness, light-headedness, numbness and headaches.  Hematological: Negative for adenopathy. Does not bruise/bleed easily.  Psychiatric/Behavioral: Negative for dysphoric mood. The patient is not nervous/anxious.         Objective:   Physical Exam  Constitutional: She appears well-developed and well-nourished. No distress.  overwt and well app  HENT:  Head: Normocephalic and atraumatic.  Right Ear: External ear normal.  Left Ear: External ear normal.  Nose: Nose normal.  Mouth/Throat: Oropharynx is clear and moist.  Eyes: Conjunctivae and EOM are normal. Pupils are equal, round, and reactive to light. Right eye exhibits no discharge. Left eye exhibits no discharge. No scleral icterus.  Neck: Normal range of motion. Neck supple. No JVD present. No thyromegaly present.  Cardiovascular: Normal rate, regular rhythm, normal heart sounds and intact distal pulses.  Exam reveals no gallop.   Pulmonary/Chest: Effort normal and breath sounds normal. No respiratory distress. She has no wheezes. She has no rales.  Abdominal: Soft. Bowel sounds are normal. She exhibits no distension and no mass. There is no tenderness.  Genitourinary: No breast swelling, tenderness, discharge or bleeding.  Breast exam: No mass, nodules, thickening, tenderness, bulging, retraction, inflamation, nipple discharge or skin changes noted.  No axillary or clavicular LA.      Musculoskeletal: She exhibits no edema and no tenderness.  Lymphadenopathy:    She has no cervical adenopathy.  Neurological: She is alert. She has normal reflexes. No cranial nerve deficit. She exhibits normal muscle tone. Coordination normal.  Skin: Skin is warm and dry. No rash noted. No erythema. No pallor.  Psychiatric: She has a normal mood and affect.          Assessment & Plan:   Problem List  Items Addressed This Visit     Cardiovascular and Mediastinum   HYPERTENSION, MILD      bp in fair control at this time  BP Readings from Last 1 Encounters:  12/06/13 136/84   No changes needed- will watch closely /controlled with lifestyle  Disc lifstyle change with low sodium diet and exercise   Lab today      Other   HYPERLIPIDEMIA     Rev labs from [facility] from feb Low HDL-disc ways to inc this Disc goals for lipids and reasons to control them Rev labs with pt Rev low sat fat diet in detail Lab today zocor and diet     Relevant Orders      Lipid panel   Hyperglycemia  A1C today  Disc need for wt loss     Relevant Orders      Hemoglobin A1c (Completed)   Unspecified vitamin D deficiency     D level low in Feb Now on 5000 iu daily Re check today Disc imp to bone and overall health    Relevant Orders      Vit D  25 hydroxy (rtn osteoporosis monitoring)   Routine general medical examination at a health care facility - Primary     Reviewed health habits including diet and exercise and skin cancer prevention Reviewed appropriate screening tests for age  Also reviewed health mt list, fam hx and immunization status , as well as social and family history   See HPI Needs to loose wt  Lab today    Relevant Orders      CBC with Differential (Completed)      Comprehensive metabolic panel      TSH      Lipid panel   Personal history of colonic polyps     Ref for 5 year colonosc     Relevant Orders      Ambulatory referral to Gastroenterology   Obesity     Discussed how this problem influences overall health and the risks it imposes  Reviewed plan for weight loss with lower calorie diet (via better food choices and also portion control or program like weight watchers) and exercise building up to or more than 30 minutes 5 days per week including some aerobic activity

## 2013-12-06 NOTE — Assessment & Plan Note (Signed)
Reviewed health habits including diet and exercise and skin cancer prevention Reviewed appropriate screening tests for age  Also reviewed health mt list, fam hx and immunization status , as well as social and family history   See HPI Needs to loose wt  Lab today

## 2013-12-06 NOTE — Assessment & Plan Note (Signed)
Ref for 5 year colonosc

## 2013-12-06 NOTE — Assessment & Plan Note (Signed)
Discussed how this problem influences overall health and the risks it imposes  Reviewed plan for weight loss with lower calorie diet (via better food choices and also portion control or program like weight watchers) and exercise building up to or more than 30 minutes 5 days per week including some aerobic activity    

## 2013-12-06 NOTE — Telephone Encounter (Signed)
Cholesterol is up  Work on healthy diet and exercise Avoid red meat/ fried foods/ egg yolks/ fatty breakfast meats/ butter, cheese and high fat dairy/ and shellfish   D level still too low Please send in vit D weekly and re check D level in 12 weeks

## 2013-12-06 NOTE — Patient Instructions (Signed)
Don't forget to schedule your annual mammogram Stop at check out for ref for colonoscopy Increase fish oil if tolerated to 3000 mg daily  Exercise as much as you can to increase your good cholesterol  Labs today   Take care of yourself

## 2013-12-06 NOTE — Assessment & Plan Note (Signed)
Rev labs from [place] from feb Low HDL-disc ways to inc this Disc goals for lipids and reasons to control them Rev labs with pt Rev low sat fat diet in detail Lab today zocor and diet

## 2013-12-06 NOTE — Progress Notes (Signed)
Pre visit review using our clinic review tool, if applicable. No additional management support is needed unless otherwise documented below in the visit note. 

## 2013-12-06 NOTE — Assessment & Plan Note (Signed)
D level low in Feb Now on 5000 iu daily Re check today Disc imp to bone and overall health

## 2013-12-07 ENCOUNTER — Telehealth: Payer: Self-pay | Admitting: Family Medicine

## 2013-12-07 NOTE — Telephone Encounter (Signed)
Relevant patient education assigned to patient using Emmi. ° °

## 2013-12-09 MED ORDER — ERGOCALCIFEROL 1.25 MG (50000 UT) PO CAPS: 50000.0000 [IU] | ORAL_CAPSULE | ORAL | Status: DC

## 2013-12-09 NOTE — Telephone Encounter (Signed)
Pt notified of labs and Dr. Marliss Coots comments. Rx sent to pharmacy and lab appt scheduled for 03/01/14

## 2013-12-14 ENCOUNTER — Telehealth: Payer: Self-pay | Admitting: Internal Medicine

## 2013-12-14 NOTE — Telephone Encounter (Signed)
Called pt to discuss scheduling colon. Pt stated that she wanted to go ahead and schedule colon since she had paid her bill. Discussed with pt that she did not have her previous colon done here, pt was not aware that we are located in [place]. Pt states she is going to call her office in [place] to schedule since she has paid her bill. Instructed pt to call us back if she needed to.

## 2014-01-28 ENCOUNTER — Ambulatory Visit: Payer: Self-pay | Admitting: Gastroenterology

## 2014-02-01 LAB — PATHOLOGY REPORT

## 2014-02-04 ENCOUNTER — Encounter: Payer: Self-pay | Admitting: Family Medicine

## 2014-02-15 ENCOUNTER — Ambulatory Visit: Payer: Self-pay | Admitting: Family Medicine

## 2014-02-17 ENCOUNTER — Encounter: Payer: Self-pay | Admitting: Family Medicine

## 2014-02-18 ENCOUNTER — Encounter: Payer: Self-pay | Admitting: *Deleted

## 2014-03-01 ENCOUNTER — Other Ambulatory Visit: Payer: BC Managed Care – PPO

## 2014-03-02 ENCOUNTER — Telehealth: Payer: Self-pay | Admitting: Family Medicine

## 2014-03-02 NOTE — Telephone Encounter (Signed)
Patient did not come for their scheduled lab appointment 03/01/14.  Please let me know if the patient needs to be contacted immediately for follow up or if no follow up is necessary.

## 2014-03-02 NOTE — Telephone Encounter (Signed)
Spoke to pt  R/s appointment to 9/8 pt aware of appointment date and time

## 2014-03-02 NOTE — Telephone Encounter (Signed)
Please reschedule

## 2014-03-08 ENCOUNTER — Other Ambulatory Visit (INDEPENDENT_AMBULATORY_CARE_PROVIDER_SITE_OTHER): Payer: BC Managed Care – PPO

## 2014-03-08 DIAGNOSIS — E559 Vitamin D deficiency, unspecified: Secondary | ICD-10-CM

## 2014-03-08 LAB — VITAMIN D 25 HYDROXY (VIT D DEFICIENCY, FRACTURES): VITD: 41.43 ng/mL (ref 30.00–100.00)

## 2014-03-09 ENCOUNTER — Encounter: Payer: Self-pay | Admitting: *Deleted

## 2014-04-14 ENCOUNTER — Other Ambulatory Visit: Payer: Self-pay

## 2014-04-14 NOTE — Telephone Encounter (Signed)
Her last vitamin D level was in the nl range in Sept- so I think the high dosing would be too much  However as a compromise- ask her to buy vit D3 2000 iu otc and take 1 of those a day- I think it will help without raising her level too high too quickly

## 2014-04-14 NOTE — Telephone Encounter (Signed)
Pt left v/m wanting to know if Dr Glori Bickers would refill the Vit D 50,000 unit to [facility]. Pt said recently been taking 400 units of Vit D OTC. Pt said she felt better when taking Vit D 50,000 unit; now pt feels sluggish and tired. Last Vit D 50,000 unit was taken last month. Pt request cb.

## 2014-04-19 NOTE — Telephone Encounter (Signed)
Pt notified of Dr. Tower's comments  

## 2014-05-02 ENCOUNTER — Encounter: Payer: Self-pay | Admitting: Family Medicine

## 2014-07-20 ENCOUNTER — Emergency Department: Payer: Self-pay | Admitting: Emergency Medicine

## 2014-07-20 LAB — COMPREHENSIVE METABOLIC PANEL
ALT: 24 U/L
Albumin: 3.8 g/dL (ref 3.4–5.0)
Alkaline Phosphatase: 119 U/L — ABNORMAL HIGH
Anion Gap: 8 (ref 7–16)
BUN: 12 mg/dL (ref 7–18)
Bilirubin,Total: 0.2 mg/dL (ref 0.2–1.0)
CALCIUM: 8.8 mg/dL (ref 8.5–10.1)
CHLORIDE: 107 mmol/L (ref 98–107)
CO2: 27 mmol/L (ref 21–32)
Creatinine: 0.9 mg/dL (ref 0.60–1.30)
EGFR (African American): >60
Glucose: 137 mg/dL — ABNORMAL HIGH (ref 65–99)
Osmolality: 285 (ref 275–301)
POTASSIUM: 3.3 mmol/L — AB (ref 3.5–5.1)
SGOT(AST): 10 U/L — ABNORMAL LOW (ref 15–37)
Sodium: 142 mmol/L (ref 136–145)
Total Protein: 7.3 g/dL (ref 6.4–8.2)

## 2014-07-20 LAB — CBC WITH DIFFERENTIAL/PLATELET
Basophil #: 0.1 10*3/uL (ref 0.0–0.1)
Basophil %: 1.1 %
Eosinophil #: 0.4 10*3/uL (ref 0.0–0.7)
Eosinophil %: 3.6 %
HCT: 40.8 % (ref 35.0–47.0)
HGB: 13.2 g/dL (ref 12.0–16.0)
LYMPHS ABS: 3.4 10*3/uL (ref 1.0–3.6)
Lymphocyte %: 32 %
MCH: 28.2 pg (ref 26.0–34.0)
MCHC: 32.4 g/dL (ref 32.0–36.0)
MCV: 87 fL (ref 80–100)
MONOS PCT: 6.3 %
Monocyte #: 0.7 x10 3/mm (ref 0.2–0.9)
NEUTROS PCT: 57 %
Neutrophil #: 6 10*3/uL (ref 1.4–6.5)
Platelet: 284 10*3/uL (ref 150–440)
RBC: 4.68 10*6/uL (ref 3.80–5.20)
RDW: 13.2 % (ref 11.5–14.5)
WBC: 10.5 10*3/uL (ref 3.6–11.0)

## 2014-07-26 ENCOUNTER — Encounter: Payer: Self-pay | Admitting: Family Medicine

## 2014-07-26 ENCOUNTER — Ambulatory Visit (INDEPENDENT_AMBULATORY_CARE_PROVIDER_SITE_OTHER): Payer: BLUE CROSS/BLUE SHIELD | Admitting: Family Medicine

## 2014-07-26 ENCOUNTER — Other Ambulatory Visit: Payer: Self-pay | Admitting: Family Medicine

## 2014-07-26 VITALS — BP 126/84 | HR 69 | Temp 97.8°F | Ht 67.75 in | Wt 199.0 lb

## 2014-07-26 DIAGNOSIS — J019 Acute sinusitis, unspecified: Secondary | ICD-10-CM | POA: Insufficient documentation

## 2014-07-26 DIAGNOSIS — H8110 Benign paroxysmal vertigo, unspecified ear: Secondary | ICD-10-CM | POA: Insufficient documentation

## 2014-07-26 DIAGNOSIS — J0100 Acute maxillary sinusitis, unspecified: Secondary | ICD-10-CM

## 2014-07-26 DIAGNOSIS — H8113 Benign paroxysmal vertigo, bilateral: Secondary | ICD-10-CM

## 2014-07-26 MED ORDER — MECLIZINE HCL 25 MG PO TABS: 25.0000 mg | ORAL_TABLET | Freq: Three times a day (TID) | ORAL | Status: DC | PRN

## 2014-07-26 NOTE — Patient Instructions (Signed)
Finish augmentin for the sinus infection  Try meclizine for vertigo- caution of sedation  Stay out of work tomorrow  Update if not starting to improve in a week or if worsening

## 2014-07-26 NOTE — Progress Notes (Signed)
Subjective:    Patient ID: Barbara Kaiser, female    DOB: 06/25/1956, 59 y.o.   MRN: [id number]  HPI Here for ED at [facility] on 07/20/14 Had vomiting and dizziness (no headache)  She got up wed night to use the bathroom and was immed dizzy - the room was spinning  It scared her   Perhaps had a slight cold before this   Did a CT scan and showed a sinus infection - her L maxillary sinus was opacified adn R sphenoid   (of note she gets when she lies on that side)  Also CT angio of head - that was normal  Ears are popping  Not too much congestion - but has post nasal drip  Was given augmentin which she was taking    EKG NSR rate 65 -no change from last one  Normal cbc  K was 3.3 (vomiting)  Glucose 137 bp in ER 150/70 she thinks   Here  BP Readings from Last 3 Encounters:  07/26/14 126/84  12/06/13 136/84  03/05/13 140/82    Has been controlled   Thinks they gave her med for dizziness/ vomiting - IV  No med to take home   Now -still slt dizzy Not nauseated  No ha   Patient Active Problem List   Diagnosis Date Noted  . Personal history of colonic polyps 12/06/2013  . Obesity 12/06/2013  . Routine general medical examination at a health care facility 08/16/2012  . CAD (coronary artery disease) 12/05/2011  . Preoperative cardiovascular examination 12/05/2011  . Hyperglycemia 08/20/2011  . Unspecified vitamin D deficiency 08/20/2011  . HYPERLIPIDEMIA 06/21/2009  . HYPERTENSION, MILD 02/13/2007  . ALLERGIC RHINITIS 02/13/2007  . ASTHMA 02/13/2007   Past Medical History  Diagnosis Date  . Allergic rhinitis   . Anemia, iron deficiency   . Asthma   . Uterine fibroid     with heavy menses and anemia (resolved by ablation)  . Colon polyps 3/10  . Complication of anesthesia     hard to wake   Past Surgical History  Procedure Laterality Date  . Nasal sinus surgery  11/07  . Tubal ligation    . Dilation and curettage of uterus  12/08    hysteroscopy and endometrial  ablation  . Cardiac catheterization  05/2005    mild CAD  . Laparoscopic supracervical hysterectomy  12/09/2011    Procedure: LAPAROSCOPIC SUPRACERVICAL HYSTERECTOMY;  Surgeon: Osborne Oman, MD;  Location: [facility];  Service: Gynecology;  Laterality: N/A;  . Salpingoophorectomy  12/09/2011    Procedure: SALPINGO OOPHERECTOMY;  Surgeon: Osborne Oman, MD;  Location: [facility];  Service: Gynecology;  Laterality: Bilateral;  . Supracervical abdominal hysterectomy  12/09/2011    Procedure: HYSTERECTOMY SUPRACERVICAL ABDOMINAL;  Surgeon: Osborne Oman, MD;  Location: [facility] [facility];  Service: Gynecology;  Laterality: N/A;   History  Substance Use Topics  . Smoking status: Former Smoker -- 0.25 packs/day for 24 years    Quit date: 07/01/2001  . Smokeless tobacco: Not on file  . Alcohol Use: No   Family History  Problem Relation Age of Onset  . Hypertension Mother   . Cancer Mother 20    uterine  . Hypertension Father   . Heart disease Other     MI  . Diabetes Other   . Stroke Other   . Heart disease Brother     age 66 and 23  . Cancer Other     lung   Allergies  Allergen  Reactions  . Codeine Nausea And Vomiting   Current Outpatient Prescriptions on File Prior to Visit  Medication Sig Dispense Refill  . aspirin 81 MG tablet Take 81 mg by mouth as needed.    . ergocalciferol (VITAMIN D2) 50000 UNITS capsule Take 1 capsule (50,000 Units total) by mouth once a week. 12 capsule 0  . fluticasone (FLONASE) 50 MCG/ACT nasal spray INHALE 2 SPRAYS IN EACH NOSTRIL ONCE DAILY AS NEEDED 16 g 3  . Multiple Vitamin (MULTIVITAMIN) tablet Take 1 tablet by mouth daily.    . simvastatin (ZOCOR) 20 MG tablet Take 1 tablet (20 mg total) by mouth at bedtime. 30 tablet 11   No current facility-administered medications on file prior to visit.        Review of Systems Review of Systems  Constitutional: Negative for fever, appetite change, fatigue and unexpected weight change.  Eyes: Negative for pain  and visual disturbance.  ENT pos for congestion and neg for sinus pain  Respiratory: Negative for cough and shortness of breath.   Cardiovascular: Negative for cp or palpitations    Gastrointestinal: Negative for nausea, diarrhea and constipation.  Genitourinary: Negative for urgency and frequency.  Skin: Negative for pallor or rash   Neurological: Negative for weakness,  numbness and headaches. pos for dizziness Hematological: Negative for adenopathy. Does not bruise/bleed easily.  Psychiatric/Behavioral: Negative for dysphoric mood. The patient is not nervous/anxious.         Objective:   Physical Exam  Constitutional: She is oriented to person, place, and time. She appears well-developed and well-nourished. No distress.  HENT:  Head: Normocephalic and atraumatic.  Right Ear: External ear normal.  Left Ear: External ear normal.  Mouth/Throat: Oropharynx is clear and moist. No oropharyngeal exudate.  Nares are mildly congested  No facial tenderness TMs are clear   Eyes: Conjunctivae and EOM are normal. Pupils are equal, round, and reactive to light.  2-3 beats of horizontal nystagmus noted   Neck: Normal range of motion. Neck supple. No JVD present. Carotid bruit is not present. No thyromegaly present.  Cardiovascular: Normal rate, regular rhythm, normal heart sounds and intact distal pulses.  Exam reveals no gallop.   Pulmonary/Chest: Effort normal and breath sounds normal. No respiratory distress. She has no wheezes. She has no rales.  Lymphadenopathy:    She has no cervical adenopathy.  Neurological: She is alert and oriented to person, place, and time. She has normal reflexes. She displays no atrophy and no tremor. No cranial nerve deficit or sensory deficit. She exhibits normal muscle tone. Coordination and gait normal.  No cerebellar signs   Skin: Skin is warm and dry. No rash noted. No erythema. No pallor.  Psychiatric: She has a normal mood and affect.            Assessment & Plan:   Problem List Items Addressed This Visit      Respiratory   Acute sinusitis - Primary    Per CT scan from [facility] ED- L maxillary and R sphenoid On augmentin and feeling better Disc symptomatic care - see instructions on AVS  No doubt this prompted her vertigo       Relevant Medications   amoxicillin-clavulanate (AUGMENTIN) 875-125 MG per tablet     Nervous and Auditory   Benign paroxysmal positional vertigo    Ref ED note/labs/studies from [facility] 1/20 Sinusitis noted Nl CTA of head  No change on EKG  bp is back to her baseline  Overall feeling better but  still a bit dizzy Given px for meclizine- will stay out of work another day -warned of sedation  Disc slow changes in position Given handout on vertigo No doubt rel to ETD and sinusitis If not imp in the next week will consider ENT ref

## 2014-07-26 NOTE — Progress Notes (Signed)
Pre visit review using our clinic review tool, if applicable. No additional management support is needed unless otherwise documented below in the visit note. 

## 2014-07-26 NOTE — Assessment & Plan Note (Signed)
Per CT scan from [facility] ED- L maxillary and R sphenoid On augmentin and feeling better Disc symptomatic care - see instructions on AVS  No doubt this prompted her vertigo

## 2014-07-26 NOTE — Assessment & Plan Note (Signed)
Ref ED note/labs/studies from [facility] 1/20 Sinusitis noted Nl CTA of head  No change on EKG  bp is back to her baseline  Overall feeling better but still a bit dizzy Given px for meclizine- will stay out of work another day -warned of sedation  Disc slow changes in position Given handout on vertigo No doubt rel to ETD and sinusitis If not imp in the next week will consider ENT ref

## 2015-01-08 ENCOUNTER — Telehealth: Payer: Self-pay | Admitting: Family Medicine

## 2015-01-08 DIAGNOSIS — E559 Vitamin D deficiency, unspecified: Secondary | ICD-10-CM

## 2015-01-08 DIAGNOSIS — Z0000 Encounter for general adult medical examination without abnormal findings: Secondary | ICD-10-CM

## 2015-01-08 DIAGNOSIS — R739 Hyperglycemia, unspecified: Secondary | ICD-10-CM

## 2015-01-08 NOTE — Telephone Encounter (Signed)
-----   Message from Marchia Bond sent at 01/04/2015  1:46 PM EDT ----- Regarding: Cpx labs Mon 7/11, need orders please :-) Please order  future cpx labs for pt's upcoming lab appt. Thanks Aniceto Boss

## 2015-01-09 ENCOUNTER — Other Ambulatory Visit (INDEPENDENT_AMBULATORY_CARE_PROVIDER_SITE_OTHER): Payer: BLUE CROSS/BLUE SHIELD

## 2015-01-09 DIAGNOSIS — R7989 Other specified abnormal findings of blood chemistry: Secondary | ICD-10-CM | POA: Diagnosis not present

## 2015-01-09 DIAGNOSIS — E559 Vitamin D deficiency, unspecified: Secondary | ICD-10-CM

## 2015-01-09 DIAGNOSIS — R739 Hyperglycemia, unspecified: Secondary | ICD-10-CM

## 2015-01-09 DIAGNOSIS — Z0000 Encounter for general adult medical examination without abnormal findings: Secondary | ICD-10-CM

## 2015-01-09 LAB — CBC WITH DIFFERENTIAL/PLATELET
BASOS ABS: 0 10*3/uL (ref 0.0–0.1)
Basophils Relative: 0.6 % (ref 0.0–3.0)
EOS ABS: 0.3 10*3/uL (ref 0.0–0.7)
Eosinophils Relative: 4.4 % (ref 0.0–5.0)
HCT: 40.5 % (ref 36.0–46.0)
Hemoglobin: 13.3 g/dL (ref 12.0–15.0)
LYMPHS ABS: 1.9 10*3/uL (ref 0.7–4.0)
LYMPHS PCT: 31.9 % (ref 12.0–46.0)
MCHC: 32.8 g/dL (ref 30.0–36.0)
MCV: 85.6 fl (ref 78.0–100.0)
Monocytes Absolute: 0.2 10*3/uL (ref 0.1–1.0)
Monocytes Relative: 4.2 % (ref 3.0–12.0)
NEUTROS ABS: 3.5 10*3/uL (ref 1.4–7.7)
NEUTROS PCT: 58.9 % (ref 43.0–77.0)
PLATELETS: 261 10*3/uL (ref 150.0–400.0)
RBC: 4.73 Mil/uL (ref 3.87–5.11)
RDW: 13.5 % (ref 11.5–15.5)
WBC: 5.9 10*3/uL (ref 4.0–10.5)

## 2015-01-09 LAB — VITAMIN D 25 HYDROXY (VIT D DEFICIENCY, FRACTURES): VITD: 20.98 ng/mL — AB (ref 30.00–100.00)

## 2015-01-09 LAB — COMPREHENSIVE METABOLIC PANEL
ALT: 13 U/L (ref 0–35)
AST: 14 U/L (ref 0–37)
Albumin: 3.8 g/dL (ref 3.5–5.2)
Alkaline Phosphatase: 102 U/L (ref 39–117)
BILIRUBIN TOTAL: 0.4 mg/dL (ref 0.2–1.2)
BUN: 11 mg/dL (ref 6–23)
CALCIUM: 9.3 mg/dL (ref 8.4–10.5)
CO2: 29 meq/L (ref 19–32)
Chloride: 106 mEq/L (ref 96–112)
Creatinine, Ser: 0.69 mg/dL (ref 0.40–1.20)
GFR: 112.91 mL/min (ref >60.00)
Glucose, Bld: 139 mg/dL — ABNORMAL HIGH (ref 70–99)
POTASSIUM: 3.7 meq/L (ref 3.5–5.1)
Sodium: 141 mEq/L (ref 135–145)
Total Protein: 7 g/dL (ref 6.0–8.3)

## 2015-01-09 LAB — LIPID PANEL
Cholesterol: 186 mg/dL (ref 0–200)
HDL: 31.9 mg/dL — AB (ref >39.00)
NonHDL: 154.1
Total CHOL/HDL Ratio: 6
Triglycerides: 203 mg/dL — ABNORMAL HIGH (ref 0.0–149.0)
VLDL: 40.6 mg/dL — ABNORMAL HIGH (ref 0.0–40.0)

## 2015-01-09 LAB — HEMOGLOBIN A1C: Hgb A1c MFr Bld: 5.6 % (ref 4.6–6.5)

## 2015-01-09 LAB — LDL CHOLESTEROL, DIRECT: LDL DIRECT: 136 mg/dL

## 2015-01-09 LAB — TSH: TSH: 1.93 u[IU]/mL (ref 0.35–4.50)

## 2015-01-11 ENCOUNTER — Ambulatory Visit (INDEPENDENT_AMBULATORY_CARE_PROVIDER_SITE_OTHER): Payer: BLUE CROSS/BLUE SHIELD | Admitting: Family Medicine

## 2015-01-11 ENCOUNTER — Encounter: Payer: Self-pay | Admitting: Family Medicine

## 2015-01-11 ENCOUNTER — Other Ambulatory Visit (HOSPITAL_COMMUNITY)
Admission: RE | Admit: 2015-01-11 | Discharge: 2015-01-11 | Disposition: A | Discharge status: Home / Self Care | Payer: BLUE CROSS/BLUE SHIELD | Source: Ambulatory Visit | Attending: Family Medicine | Admitting: Family Medicine

## 2015-01-11 VITALS — BP 125/70 | HR 81 | Temp 98.3°F | Ht 67.75 in | Wt 197.5 lb

## 2015-01-11 DIAGNOSIS — E785 Hyperlipidemia, unspecified: Secondary | ICD-10-CM

## 2015-01-11 DIAGNOSIS — Z1151 Encounter for screening for human papillomavirus (HPV): Secondary | ICD-10-CM | POA: Diagnosis present

## 2015-01-11 DIAGNOSIS — E669 Obesity, unspecified: Secondary | ICD-10-CM

## 2015-01-11 DIAGNOSIS — Z0000 Encounter for general adult medical examination without abnormal findings: Secondary | ICD-10-CM | POA: Diagnosis not present

## 2015-01-11 DIAGNOSIS — E559 Vitamin D deficiency, unspecified: Secondary | ICD-10-CM

## 2015-01-11 DIAGNOSIS — Z01419 Encounter for gynecological examination (general) (routine) without abnormal findings: Secondary | ICD-10-CM | POA: Diagnosis not present

## 2015-01-11 DIAGNOSIS — R739 Hyperglycemia, unspecified: Secondary | ICD-10-CM

## 2015-01-11 LAB — HM PAP SMEAR: HM Pap smear: NORMAL

## 2015-01-11 MED ORDER — SIMVASTATIN 20 MG PO TABS: 20.0000 mg | ORAL_TABLET | Freq: Every day | ORAL | Status: DC

## 2015-01-11 NOTE — Progress Notes (Signed)
Subjective:    Patient ID: Barbara Kaiser, female    DOB: [birth year], 59 y.o.   MRN: [id number]  HPI Here for health maintenance exam and to review chronic medical problems    Feels good   Perhaps a small knot rectal when she has a bm No blood in stool   Wt is down 2 lb  bmi of 30  Trying to loose wt  Eating better  Walking for exercise - (and lots of walking at her job) -aims for 10,000 steps   bp is up a bit  BP Readings from Last 3 Encounters:  01/11/15 140/76  07/26/14 126/84  12/06/13 136/84    Has already had screening for HIV and hep C - 2 y ago for job (2 y ago)  Last pap 2/13  Done with periods after hysterectomy (fibroid) - total but supracervical  No gyn symptoms   Had colonoscopy 7/15 - had several polyps  Recall 5 y   Last mammogram 8/15  She will schedule it  No lumps on self exam  Flu shot - had last fall   Td 9/07  Hyperglycemia Lab Results  Component Value Date   HGBA1C 5.6 01/09/2015   (last time was 5.9)  Is working on low glucose diet   D level is low at 20  Not taking her vit D (has 5000 iu pills)  No falls or fractures   Hyperlipidemia Lab Results  Component Value Date   CHOL 186 01/09/2015   CHOL 210* 02/09/2013   CHOL 224* 08/17/2012   Lab Results  Component Value Date   HDL 31.90* 01/09/2015   HDL 33.00* 02/09/2013   HDL 34.70* 08/17/2012   Lab Results  Component Value Date   LDLCALC 145* 03/03/2012   LDLCALC 128* 06/26/2010   LDLCALC 125* 06/21/2009   Lab Results  Component Value Date   TRIG 203.0* 01/09/2015   TRIG 187.0* 02/09/2013   TRIG 120.0 08/17/2012   Lab Results  Component Value Date   CHOLHDL 6 01/09/2015   CHOLHDL 6 02/09/2013   CHOLHDL 6 08/17/2012   Lab Results  Component Value Date   LDLDIRECT 136.0 01/09/2015   LDLDIRECT 158.6 02/09/2013   LDLDIRECT 162.1 08/17/2012   ok - but not at goal  On simvastatin 20 mg - sometimes misses doses  Avoid red meat and fried food  Ate one hamburger      Chemistry      Component Value Date/Time   NA 141 01/09/2015 0832   NA 142 07/20/2014 2216   NA 142 03/03/2012 1649   K 3.7 01/09/2015 0832   K 3.3* 07/20/2014 2216   CL 106 01/09/2015 0832   CL 107 07/20/2014 2216   CO2 29 01/09/2015 0832   CO2 27 07/20/2014 2216   BUN 11 01/09/2015 0832   BUN 12 07/20/2014 2216   BUN 14 03/03/2012 1649   CREATININE 0.69 01/09/2015 0832   CREATININE 0.90 07/20/2014 2216      Component Value Date/Time   CALCIUM 9.3 01/09/2015 0832   CALCIUM 8.8 07/20/2014 2216   ALKPHOS 102 01/09/2015 0832   ALKPHOS 119* 07/20/2014 2216   AST 14 01/09/2015 0832   AST 10* 07/20/2014 2216   ALT 13 01/09/2015 0832   ALT 24 07/20/2014 2216   BILITOT 0.4 01/09/2015 0832      Lab Results  Component Value Date   WBC 5.9 01/09/2015   HGB 13.3 01/09/2015   HCT 40.5 01/09/2015   MCV 85.6 01/09/2015  PLT 261.0 01/09/2015    Lab Results  Component Value Date   TSH 1.93 01/09/2015      Patient Active Problem List   Diagnosis Date Noted  . Encounter for routine gynecological examination 01/11/2015  . Acute sinusitis 07/26/2014  . Benign paroxysmal positional vertigo 07/26/2014  . Personal history of colonic polyps 12/06/2013  . Obesity 12/06/2013  . Routine general medical examination at a health care facility 08/16/2012  . CAD (coronary artery disease) 12/05/2011  . Preoperative cardiovascular examination 12/05/2011  . Hyperglycemia 08/20/2011  . Vitamin D deficiency 08/20/2011  . Hyperlipidemia 06/21/2009  . HYPERTENSION, MILD 02/13/2007  . ALLERGIC RHINITIS 02/13/2007  . ASTHMA 02/13/2007   Past Medical History  Diagnosis Date  . Allergic rhinitis   . Anemia, iron deficiency   . Asthma   . Uterine fibroid     with heavy menses and anemia (resolved by ablation)  . Colon polyps 3/10  . Complication of anesthesia     hard to wake   Past Surgical History  Procedure Laterality Date  . Nasal sinus surgery  11/07  . Tubal ligation    .  Dilation and curettage of uterus  12/08    hysteroscopy and endometrial ablation  . Cardiac catheterization  05/2005    mild CAD  . Laparoscopic supracervical hysterectomy  12/09/2011    Procedure: LAPAROSCOPIC SUPRACERVICAL HYSTERECTOMY;  Surgeon: Osborne Oman, MD;  Location: [facility];  Service: Gynecology;  Laterality: N/A;  . Salpingoophorectomy  12/09/2011    Procedure: SALPINGO OOPHERECTOMY;  Surgeon: Osborne Oman, MD;  Location: [place] [facility];  Service: Gynecology;  Laterality: Bilateral;  . Supracervical abdominal hysterectomy  12/09/2011    Procedure: HYSTERECTOMY SUPRACERVICAL ABDOMINAL;  Surgeon: Osborne Oman, MD;  Location: [place] [facility];  Service: Gynecology;  Laterality: N/A;   History  Substance Use Topics  . Smoking status: Former Smoker -- 0.25 packs/day for 24 years    Quit date: 07/01/2001  . Smokeless tobacco: Not on file  . Alcohol Use: No   Family History  Problem Relation Age of Onset  . Hypertension Mother   . Cancer Mother 20    uterine  . Hypertension Father   . Heart disease Other     MI  . Diabetes Other   . Stroke Other   . Heart disease Brother     age 4 and 46  . Cancer Other     lung   Allergies  Allergen Reactions  . Codeine Nausea And Vomiting   Current Outpatient Prescriptions on File Prior to Visit  Medication Sig Dispense Refill  . aspirin 81 MG tablet Take 81 mg by mouth as needed.    . fluticasone (FLONASE) 50 MCG/ACT nasal spray INHALE 2 SPRAYS IN EACH NOSTRIL ONCE DAILY AS NEEDED 16 g 3  . Multiple Vitamin (MULTIVITAMIN) tablet Take 1 tablet by mouth daily.     No current facility-administered medications on file prior to visit.    Review of Systems Review of Systems  Constitutional: Negative for fever, appetite change, fatigue and unexpected weight change.  Eyes: Negative for pain and visual disturbance.  Respiratory: Negative for cough and shortness of breath.   Cardiovascular: Negative for cp or palpitations    Gastrointestinal:  Negative for nausea, diarrhea and constipation.  Genitourinary: Negative for urgency and frequency.  Skin: Negative for pallor or rash   Neurological: Negative for weakness, light-headedness, numbness and headaches.  Hematological: Negative for adenopathy. Does not bruise/bleed easily.  Psychiatric/Behavioral: Negative for dysphoric  mood. The patient is not nervous/anxious.          Objective:   Physical Exam  Constitutional: She appears well-developed and well-nourished. No distress.  overwt and well app  HENT:  Head: Normocephalic and atraumatic.  Right Ear: External ear normal.  Left Ear: External ear normal.  Mouth/Throat: Oropharynx is clear and moist.  Eyes: Conjunctivae and EOM are normal. Pupils are equal, round, and reactive to light. No scleral icterus.  Neck: Normal range of motion. Neck supple. No JVD present. Carotid bruit is not present. No thyromegaly present.  Cardiovascular: Normal rate, regular rhythm, normal heart sounds and intact distal pulses.  Exam reveals no gallop.   Pulmonary/Chest: Effort normal and breath sounds normal. No respiratory distress. She has no wheezes. She exhibits no tenderness.  Abdominal: Soft. Bowel sounds are normal. She exhibits no distension, no abdominal bruit and no mass. There is no tenderness.  Genitourinary: Vagina normal and uterus normal. Rectal exam shows external hemorrhoid. No breast swelling, tenderness, discharge or bleeding. There is no rash, tenderness or lesion on the right labia. There is no rash, tenderness or lesion on the left labia. Cervix exhibits no motion tenderness, no discharge and no friability. Right adnexum displays no mass, no tenderness and no fullness. Left adnexum displays no mass, no tenderness and no fullness. No vaginal discharge found.  Small non thrombosed ext hemorrhoid   Uterus is surgically absent   Cervix is posterior and diff to visualize due to vaginal prolapse   Breast exam: No mass, nodules,  thickening, tenderness, bulging, retraction, inflamation, nipple discharge or skin changes noted.  No axillary or clavicular LA.      Musculoskeletal: Normal range of motion. She exhibits no edema or tenderness.  Lymphadenopathy:    She has no cervical adenopathy.  Neurological: She is alert. She has normal reflexes. No cranial nerve deficit. She exhibits normal muscle tone. Coordination normal.  Skin: Skin is warm and dry. No rash noted. No erythema. No pallor.  Psychiatric: She has a normal mood and affect.          Assessment & Plan:   Problem List Items Addressed This Visit    Encounter for routine gynecological examination    Routine exam and pap for pt with a supracervical hysterectomy      Relevant Orders   Cytology - PAP   Hyperglycemia    Lab Results  Component Value Date   HGBA1C 5.6 01/09/2015   Disc low glycemic diet and wt loss to prevent DM      Hyperlipidemia    Not at goal but pt has not been compliant with statin  Enc compliance -she agreed Disc goals for lipids and reasons to control them Rev labs with pt Rev low sat fat diet in detail       Relevant Medications   simvastatin (ZOCOR) 20 MG tablet   Obesity    Discussed how this problem influences overall health and the risks it imposes  Reviewed plan for weight loss with lower calorie diet (via better food choices and also portion control or program like weight watchers) and exercise building up to or more than 30 minutes 5 days per week including some aerobic activity         Routine general medical examination at a health care facility - Primary    Reviewed health habits including diet and exercise and skin cancer prevention Reviewed appropriate screening tests for age  Also reviewed health mt list, fam hx and immunization  status , as well as social and family history   See HPI Labs rev Get back on vitamin D 5000 iu daily over the counter  Also do not miss doses of your cholesterol medicine    Don't forget to schedule your mammogram  Work on low sugar/low fat diet and exercise and weight loss  Follow up in 6 months for cholesterol with lab prior        Vitamin D deficiency    Level 20  Enc to get back on 5000 iu vit D3 daily She agreed  Will continue to follow

## 2015-01-11 NOTE — Patient Instructions (Signed)
Get back on vitamin D 5000 iu daily over the counter  Also do not miss doses of your cholesterol medicine  Don't forget to schedule your mammogram  Work on low sugar/low fat diet and exercise and weight loss  Follow up in 6 months for cholesterol with lab prior

## 2015-01-11 NOTE — Progress Notes (Signed)
Pre visit review using our clinic review tool, if applicable. No additional management support is needed unless otherwise documented below in the visit note. 

## 2015-01-12 NOTE — Assessment & Plan Note (Signed)
Discussed how this problem influences overall health and the risks it imposes  Reviewed plan for weight loss with lower calorie diet (via better food choices and also portion control or program like weight watchers) and exercise building up to or more than 30 minutes 5 days per week including some aerobic activity    

## 2015-01-12 NOTE — Assessment & Plan Note (Signed)
Level 20  Enc to get back on 5000 iu vit D3 daily She agreed  Will continue to follow

## 2015-01-12 NOTE — Assessment & Plan Note (Signed)
Reviewed health habits including diet and exercise and skin cancer prevention Reviewed appropriate screening tests for age  Also reviewed health mt list, fam hx and immunization status , as well as social and family history   See HPI Labs rev Get back on vitamin D 5000 iu daily over the counter  Also do not miss doses of your cholesterol medicine  Don't forget to schedule your mammogram  Work on low sugar/low fat diet and exercise and weight loss  Follow up in 6 months for cholesterol with lab prior

## 2015-01-12 NOTE — Assessment & Plan Note (Signed)
Routine exam and pap for pt with a supracervical hysterectomy

## 2015-01-12 NOTE — Assessment & Plan Note (Signed)
Lab Results  Component Value Date   HGBA1C 5.6 01/09/2015   Disc low glycemic diet and wt loss to prevent DM

## 2015-01-12 NOTE — Assessment & Plan Note (Signed)
Not at goal but pt has not been compliant with statin  Enc compliance -she agreed Disc goals for lipids and reasons to control them Rev labs with pt Rev low sat fat diet in detail

## 2015-01-16 LAB — CYTOLOGY - PAP

## 2015-01-19 ENCOUNTER — Encounter: Payer: Self-pay | Admitting: *Deleted

## 2015-01-19 ENCOUNTER — Encounter: Payer: Self-pay | Admitting: Family Medicine

## 2015-01-19 ENCOUNTER — Other Ambulatory Visit: Payer: Self-pay | Admitting: *Deleted

## 2015-01-19 MED ORDER — FLUTICASONE PROPIONATE 50 MCG/ACT NA SUSP: NASAL | Status: DC

## 2015-03-14 ENCOUNTER — Ambulatory Visit (INDEPENDENT_AMBULATORY_CARE_PROVIDER_SITE_OTHER): Payer: BLUE CROSS/BLUE SHIELD | Admitting: Family Medicine

## 2015-03-14 ENCOUNTER — Encounter: Payer: Self-pay | Admitting: Family Medicine

## 2015-03-14 VITALS — BP 148/80 | HR 72 | Temp 98.1°F | Resp 16 | Ht 67.5 in | Wt 199.0 lb

## 2015-03-14 DIAGNOSIS — E669 Obesity, unspecified: Secondary | ICD-10-CM

## 2015-03-14 DIAGNOSIS — J0100 Acute maxillary sinusitis, unspecified: Secondary | ICD-10-CM

## 2015-03-14 DIAGNOSIS — E785 Hyperlipidemia, unspecified: Secondary | ICD-10-CM

## 2015-03-14 DIAGNOSIS — Z7189 Other specified counseling: Secondary | ICD-10-CM

## 2015-03-14 DIAGNOSIS — R739 Hyperglycemia, unspecified: Secondary | ICD-10-CM

## 2015-03-14 DIAGNOSIS — J309 Allergic rhinitis, unspecified: Secondary | ICD-10-CM | POA: Diagnosis not present

## 2015-03-14 DIAGNOSIS — Z7689 Persons encountering health services in other specified circumstances: Secondary | ICD-10-CM

## 2015-03-14 DIAGNOSIS — I10 Essential (primary) hypertension: Secondary | ICD-10-CM

## 2015-03-14 MED ORDER — AMOXICILLIN 500 MG PO CAPS: 500.0000 mg | ORAL_CAPSULE | Freq: Three times a day (TID) | ORAL | Status: DC

## 2015-03-14 NOTE — Progress Notes (Signed)
Patient ID: Barbara Kaiser, female   DOB: Feb 04, 1956, 59 y.o.   MRN: [id number]    Subjective:  HPI Pt is here today to Establish care. She wanted to change primary care providers. She was seen at [facility] primary care. (we have all the notes since they are [facility]). She recently had her CPE last month. She is also having some URI symptoms. She has nasal congestion, sinus pain. Pressure, cough (started today), scratchy throat. She reports that been going on for about a week.     Prior to Admission medications   Medication Sig Start Date End Date Taking? Authorizing Provider  aspirin 81 MG tablet Take 81 mg by mouth as needed.    Historical Provider, MD  fluticasone (FLONASE) 50 MCG/ACT nasal spray INHALE 2 SPRAYS IN EACH NOSTRIL ONCE DAILY AS NEEDED 01/19/15   Abner Greenspan, MD  Multiple Vitamin (MULTIVITAMIN) tablet Take 1 tablet by mouth daily.    Historical Provider, MD  simvastatin (ZOCOR) 20 MG tablet Take 1 tablet (20 mg total) by mouth daily at 6 PM. 01/11/15   Abner Greenspan, MD    Patient Active Problem List   Diagnosis Date Noted  . Encounter for routine gynecological examination 01/11/2015  . Acute sinusitis 07/26/2014  . Benign paroxysmal positional vertigo 07/26/2014  . Personal history of colonic polyps 12/06/2013  . Obesity 12/06/2013  . Routine general medical examination at a health care facility 08/16/2012  . CAD (coronary artery disease) 12/05/2011  . Preoperative cardiovascular examination 12/05/2011  . Hyperglycemia 08/20/2011  . Vitamin D deficiency 08/20/2011  . Hyperlipidemia 06/21/2009  . HYPERTENSION, MILD 02/13/2007  . ALLERGIC RHINITIS 02/13/2007  . ASTHMA 02/13/2007    Past Medical History  Diagnosis Date  . Allergic rhinitis   . Anemia, iron deficiency   . Asthma   . Uterine fibroid     with heavy menses and anemia (resolved by ablation)  . Colon polyps 3/10  . Complication of anesthesia     hard to wake    Social History   Social History  .  Marital Status: Single    Spouse Name: N/A  . Number of Children: 2  . Years of Education: N/A   Occupational History  .  [place] History Main Topics  . Smoking status: Former Smoker -- 0.25 packs/day for 24 years    Quit date: 07/01/2001  . Smokeless tobacco: Not on file  . Alcohol Use: No  . Drug Use: No  . Sexual Activity:    Partners: Male    Birth Control/ Protection: Surgical     Comment: tubalization.   Other Topics Concern  . Not on file   Social History Narrative   Treadmill for exercise    Allergies  Allergen Reactions  . Codeine Nausea And Vomiting    Review of Systems  Constitutional: Negative.   HENT: Positive for congestion (sinus pain and pressure. Yellow mucus coming from nose.), sore throat and tinnitus.   Eyes: Negative.   Respiratory: Positive for cough.   Cardiovascular: Negative.   Gastrointestinal: Negative.   Genitourinary: Negative.   Musculoskeletal: Negative.   Skin: Negative.   Neurological: Negative.   Endo/Heme/Allergies: Negative.   Psychiatric/Behavioral: Negative.     Immunization History  Administered Date(s) Administered  . Influenza-Unspecified 03/31/2013, 03/31/2014  . Td 03/21/2006   Objective:  BP 148/80 mmHg  Pulse 72  Temp(Src) 98.1 F (36.7 C) (Oral)  Resp 16  Ht 5' 7.5" (1.715 m)  Wt 199  lb (90.266 kg)  BMI 30.69 kg/m2  LMP 11/28/2011  Physical Exam  Constitutional: She is oriented to person, place, and time and well-developed, well-nourished, and in no distress.  HENT:  Head: Normocephalic and atraumatic.  Right Ear: External ear normal.  Left Ear: External ear normal.  Nose: Nose normal.  Mouth/Throat: Oropharynx is clear and moist.  Neck: Neck supple.  Cardiovascular: Normal rate, regular rhythm and normal heart sounds.   Pulmonary/Chest: Effort normal and breath sounds normal.  Abdominal: Soft.  Neurological: She is alert and oriented to person, place, and time.  Skin: Skin is  warm and dry.  Psychiatric: Mood, memory, affect and judgment normal.    Lab Results  Component Value Date   WBC 5.9 01/09/2015   HGB 13.3 01/09/2015   HCT 40.5 01/09/2015   PLT 261.0 01/09/2015   GLUCOSE 139* 01/09/2015   CHOL 186 01/09/2015   TRIG 203.0* 01/09/2015   HDL 31.90* 01/09/2015   LDLDIRECT 136.0 01/09/2015   LDLCALC 145* 03/03/2012   TSH 1.93 01/09/2015   HGBA1C 5.6 01/09/2015    CMP     Component Value Date/Time   NA 141 01/09/2015 0832   NA 142 07/20/2014 2216   NA 142 03/03/2012 1649   K 3.7 01/09/2015 0832   K 3.3* 07/20/2014 2216   CL 106 01/09/2015 0832   CL 107 07/20/2014 2216   CO2 29 01/09/2015 0832   CO2 27 07/20/2014 2216   GLUCOSE 139* 01/09/2015 0832   GLUCOSE 137* 07/20/2014 2216   GLUCOSE 95 03/03/2012 1649   BUN 11 01/09/2015 0832   BUN 12 07/20/2014 2216   BUN 14 03/03/2012 1649   CREATININE 0.69 01/09/2015 0832   CREATININE 0.90 07/20/2014 2216   CALCIUM 9.3 01/09/2015 0832   CALCIUM 8.8 07/20/2014 2216   PROT 7.0 01/09/2015 0832   PROT 7.3 07/20/2014 2216   PROT 7.1 03/03/2012 1649   ALBUMIN 3.8 01/09/2015 0832   ALBUMIN 3.8 07/20/2014 2216   AST 14 01/09/2015 0832   AST 10* 07/20/2014 2216   ALT 13 01/09/2015 0832   ALT 24 07/20/2014 2216   ALKPHOS 102 01/09/2015 0832   ALKPHOS 119* 07/20/2014 2216   BILITOT 0.4 01/09/2015 0832   BILITOT 0.2 07/20/2014 2216   GFRNONAA >60 07/20/2014 2216   GFRNONAA 93 03/03/2012 1649   GFRAA >60 07/20/2014 2216   GFRAA 107 03/03/2012 1649    Assessment and Plan :  1. Encounter to establish care Get old records.  2. HYPERTENSION, MILD Fair control. Get home readings.  3. Hyperlipidemia   4. Hyperglycemia   5. Obesity D and E.  6. Allergic rhinitis, unspecified allergic rhinitis type  7. Acute maxillary sinusitis, recurrence not specified  - amoxicillin (AMOXIL) 500 MG capsule; Take 1 capsule (500 mg total) by mouth 3 (three) times daily.  Dispense: 30 capsule; Refill:  0  I have done the exam and reviewed the above chart and it is accurate to the best of my knowledge.  Miguel Aschoff MD [facility] 03/14/2015 4:18 PM

## 2015-04-20 ENCOUNTER — Other Ambulatory Visit: Payer: Self-pay | Admitting: Family Medicine

## 2015-04-20 DIAGNOSIS — Z1231 Encounter for screening mammogram for malignant neoplasm of breast: Secondary | ICD-10-CM

## 2015-05-04 ENCOUNTER — Ambulatory Visit: Payer: BLUE CROSS/BLUE SHIELD

## 2015-05-08 ENCOUNTER — Ambulatory Visit
Admission: RE | Admit: 2015-05-08 | Discharge: 2015-05-08 | Disposition: A | Discharge status: Home / Self Care | Payer: BLUE CROSS/BLUE SHIELD | Source: Ambulatory Visit | Attending: Family Medicine | Admitting: Family Medicine

## 2015-05-08 DIAGNOSIS — Z1231 Encounter for screening mammogram for malignant neoplasm of breast: Secondary | ICD-10-CM | POA: Insufficient documentation

## 2015-07-11 ENCOUNTER — Telehealth: Payer: Self-pay | Admitting: Family Medicine

## 2015-07-11 DIAGNOSIS — E559 Vitamin D deficiency, unspecified: Secondary | ICD-10-CM

## 2015-07-11 DIAGNOSIS — I10 Essential (primary) hypertension: Secondary | ICD-10-CM

## 2015-07-11 DIAGNOSIS — R739 Hyperglycemia, unspecified: Secondary | ICD-10-CM

## 2015-07-11 NOTE — Telephone Encounter (Signed)
-----   Message from Ellamae Sia sent at 07/04/2015 10:58 AM EST ----- Regarding: Lab orders fro Wednesday, 11.11.17 Lab orders for a 6 month follow up appt

## 2015-07-12 ENCOUNTER — Other Ambulatory Visit: Payer: BLUE CROSS/BLUE SHIELD

## 2015-07-17 ENCOUNTER — Ambulatory Visit: Payer: BLUE CROSS/BLUE SHIELD | Admitting: Family Medicine

## 2015-08-07 ENCOUNTER — Ambulatory Visit (INDEPENDENT_AMBULATORY_CARE_PROVIDER_SITE_OTHER): Payer: BLUE CROSS/BLUE SHIELD | Admitting: Family Medicine

## 2015-08-07 ENCOUNTER — Encounter: Payer: Self-pay | Admitting: Family Medicine

## 2015-08-07 VITALS — BP 134/82 | HR 64 | Temp 98.2°F | Resp 14 | Wt 199.0 lb

## 2015-08-07 DIAGNOSIS — H6981 Other specified disorders of Eustachian tube, right ear: Secondary | ICD-10-CM | POA: Diagnosis not present

## 2015-08-07 NOTE — Progress Notes (Signed)
Patient ID: Barbara Kaiser, female   DOB: Jul 19, 1955, 60 y.o.   MRN: [id number]    Subjective:  HPI  Patient had cold symptoms 2 weeks ago and after that developed right ear pin, it is off and on. She does have post nasal drainage, slight headache and nasal congestion. No fever. She has taking Sudafed for her symptoms.  Prior to Admission medications   Medication Sig Start Date End Date Taking? Authorizing Provider  aspirin 81 MG tablet Take 81 mg by mouth as needed.   Yes Historical Provider, MD  fluticasone (FLONASE) 50 MCG/ACT nasal spray INHALE 2 SPRAYS IN EACH NOSTRIL ONCE DAILY AS NEEDED 01/19/15  Yes Abner Greenspan, MD  Multiple Vitamin (MULTIVITAMIN) tablet Take 1 tablet by mouth daily.   Yes Historical Provider, MD  simvastatin (ZOCOR) 20 MG tablet Take 1 tablet (20 mg total) by mouth daily at 6 PM. 01/11/15  Yes Abner Greenspan, MD    Patient Active Problem List   Diagnosis Date Noted  . Encounter for routine gynecological examination 01/11/2015  . Acute sinusitis 07/26/2014  . Benign paroxysmal positional vertigo 07/26/2014  . Personal history of colonic polyps 12/06/2013  . Obesity 12/06/2013  . Routine general medical examination at a health care facility 08/16/2012  . CAD (coronary artery disease) 12/05/2011  . Preoperative cardiovascular examination 12/05/2011  . Hyperglycemia 08/20/2011  . Vitamin D deficiency 08/20/2011  . Hyperlipidemia 06/21/2009  . HYPERTENSION, MILD 02/13/2007  . ALLERGIC RHINITIS 02/13/2007  . ASTHMA 02/13/2007    Past Medical History  Diagnosis Date  . Allergic rhinitis   . Anemia, iron deficiency   . Asthma   . Uterine fibroid     with heavy menses and anemia (resolved by ablation)  . Colon polyps 3/10  . Complication of anesthesia     hard to wake    Social History   Social History  . Marital Status: Single    Spouse Name: N/A  . Number of Children: 2  . Years of Education: N/A   Occupational History  .  [place] History Main Topics  . Smoking status: Former Smoker -- 0.25 packs/day for 24 years    Quit date: 07/01/2001  . Smokeless tobacco: Not on file  . Alcohol Use: No  . Drug Use: No  . Sexual Activity:    Partners: Male    Birth Control/ Protection: Surgical     Comment: tubalization.   Other Topics Concern  . Not on file   Social History Narrative   Treadmill for exercise    Allergies  Allergen Reactions  . Codeine Nausea And Vomiting    Review of Systems  Constitutional: Negative.   HENT: Positive for congestion and ear pain.   Respiratory: Negative.   Cardiovascular: Negative.   Neurological: Positive for headaches.    Immunization History  Administered Date(s) Administered  . Influenza-Unspecified 03/31/2013, 03/31/2014  . Td 03/21/2006   Objective:  BP 134/82 mmHg  Pulse 64  Temp(Src) 98.2 F (36.8 C)  Resp 14  Wt 199 lb (90.266 kg)  LMP 11/28/2011  Physical Exam  Constitutional: She is oriented to person, place, and time and well-developed, well-nourished, and in no distress.  HENT:  Head: Normocephalic and atraumatic.  Right Ear: External ear normal. Tympanic membrane is bulging (not infected).  Left Ear: External ear normal.  Mouth/Throat: Oropharynx is clear and moist.  Eyes: Conjunctivae are normal. Pupils are equal, round, and reactive to light.  Neck: Normal range  of motion. Neck supple.  Cardiovascular: Normal rate, regular rhythm, normal heart sounds and intact distal pulses.   No murmur heard. Pulmonary/Chest: Effort normal and breath sounds normal. No respiratory distress. She has no wheezes.  Abdominal: Soft.  Musculoskeletal: Normal range of motion. She exhibits no edema or tenderness.  Neurological: She is alert and oriented to person, place, and time.  Skin: Skin is warm and dry.  Psychiatric: Mood, memory, affect and judgment normal.    Lab Results  Component Value Date   WBC 5.9 01/09/2015   HGB 13.3 01/09/2015   HCT  40.5 01/09/2015   PLT 261.0 01/09/2015   GLUCOSE 139* 01/09/2015   CHOL 186 01/09/2015   TRIG 203.0* 01/09/2015   HDL 31.90* 01/09/2015   LDLDIRECT 136.0 01/09/2015   LDLCALC 145* 03/03/2012   TSH 1.93 01/09/2015   HGBA1C 5.6 01/09/2015    CMP     Component Value Date/Time   NA 141 01/09/2015 0832   NA 142 07/20/2014 2216   NA 142 03/03/2012 1649   K 3.7 01/09/2015 0832   K 3.3* 07/20/2014 2216   CL 106 01/09/2015 0832   CL 107 07/20/2014 2216   CO2 29 01/09/2015 0832   CO2 27 07/20/2014 2216   GLUCOSE 139* 01/09/2015 0832   GLUCOSE 137* 07/20/2014 2216   GLUCOSE 95 03/03/2012 1649   BUN 11 01/09/2015 0832   BUN 12 07/20/2014 2216   BUN 14 03/03/2012 1649   CREATININE 0.69 01/09/2015 0832   CREATININE 0.90 07/20/2014 2216   CALCIUM 9.3 01/09/2015 0832   CALCIUM 8.8 07/20/2014 2216   PROT 7.0 01/09/2015 0832   PROT 7.3 07/20/2014 2216   PROT 7.1 03/03/2012 1649   ALBUMIN 3.8 01/09/2015 0832   ALBUMIN 3.8 07/20/2014 2216   ALBUMIN 4.6 03/03/2012 1649   AST 14 01/09/2015 0832   AST 10* 07/20/2014 2216   ALT 13 01/09/2015 0832   ALT 24 07/20/2014 2216   ALKPHOS 102 01/09/2015 0832   ALKPHOS 119* 07/20/2014 2216   BILITOT 0.4 01/09/2015 0832   BILITOT 0.2 07/20/2014 2216   GFRNONAA >60 07/20/2014 2216   GFRNONAA 93 03/03/2012 1649   GFRAA >60 07/20/2014 2216   GFRAA 107 03/03/2012 1649    Assessment and Plan :  1. Eustachian tube dysfunction, right Viral most likely, ear is not infected today on the exam. Can try Robitussin but mainly push fluids and rest. Follow as needed. 2.URI Etiology of #1. I have done the exam and reviewed the above chart and it is accurate to the best of my knowledge.   Miguel Aschoff MD [facility] 08/07/2015 3:12 PM

## 2015-09-05 ENCOUNTER — Encounter: Payer: Self-pay | Admitting: Family Medicine

## 2015-09-05 ENCOUNTER — Ambulatory Visit
Admission: RE | Admit: 2015-09-05 | Discharge: 2015-09-05 | Disposition: A | Discharge status: Home / Self Care | Payer: BLUE CROSS/BLUE SHIELD | Source: Ambulatory Visit | Attending: Family Medicine | Admitting: Family Medicine

## 2015-09-05 ENCOUNTER — Ambulatory Visit (INDEPENDENT_AMBULATORY_CARE_PROVIDER_SITE_OTHER): Payer: BLUE CROSS/BLUE SHIELD | Admitting: Family Medicine

## 2015-09-05 VITALS — BP 162/70 | HR 68 | Temp 98.2°F | Resp 16 | Wt 200.0 lb

## 2015-09-05 DIAGNOSIS — R6889 Other general symptoms and signs: Secondary | ICD-10-CM | POA: Diagnosis present

## 2015-09-05 DIAGNOSIS — R938 Abnormal findings on diagnostic imaging of other specified body structures: Secondary | ICD-10-CM | POA: Insufficient documentation

## 2015-09-05 DIAGNOSIS — I720 Aneurysm of carotid artery: Secondary | ICD-10-CM

## 2015-09-05 LAB — POCT I-STAT CREATININE: Creatinine, Ser: 0.8 mg/dL (ref 0.44–1.00)

## 2015-09-05 MED ORDER — METOPROLOL TARTRATE 25 MG PO TABS: 25.0000 mg | ORAL_TABLET | Freq: Two times a day (BID) | ORAL | Status: DC

## 2015-09-05 MED ORDER — IOHEXOL 350 MG/ML SOLN
100.0000 mL | Freq: Once | INTRAVENOUS | Status: AC | PRN
  Administered 2015-09-05: 80 mL via INTRAVENOUS

## 2015-09-05 NOTE — Progress Notes (Signed)
Patient ID: Barbara Kaiser, female   DOB: 04-03-1956, 60 y.o.   MRN: [id number]   Tykeyah Lattner  MRN: [id number] DOB: [birth year]  Subjective:  HPI   The patient is a 60 year female who presents because she has concern over the vein in her neck.  She states that last night she noticed she could the vein in her neck moving with her heart beat.  She said her heart beat was fast and hard last night.  She is still able to see the vein and it's movement although it is at a normal pace today. No neurologic symptoms at all. It is of note that the patient has been taking Sudafed for what she thinks is a sinus infection.  Patient Active Problem List   Diagnosis Date Noted  . Encounter for routine gynecological examination 01/11/2015  . Acute sinusitis 07/26/2014  . Benign paroxysmal positional vertigo 07/26/2014  . Personal history of colonic polyps 12/06/2013  . Obesity 12/06/2013  . Routine general medical examination at a health care facility 08/16/2012  . CAD (coronary artery disease) 12/05/2011  . Preoperative cardiovascular examination 12/05/2011  . Hyperglycemia 08/20/2011  . Vitamin D deficiency 08/20/2011  . Hyperlipidemia 06/21/2009  . HYPERTENSION, MILD 02/13/2007  . ALLERGIC RHINITIS 02/13/2007  . ASTHMA 02/13/2007    Past Medical History  Diagnosis Date  . Allergic rhinitis   . Anemia, iron deficiency   . Asthma   . Uterine fibroid     with heavy menses and anemia (resolved by ablation)  . Colon polyps 3/10  . Complication of anesthesia     hard to wake    Social History   Social History  . Marital Status: Single    Spouse Name: N/A  . Number of Children: 2  . Years of Education: N/A   Occupational History  .  [place] History Main Topics  . Smoking status: Former Smoker -- 0.25 packs/day for 24 years    Quit date: 07/01/2001  . Smokeless tobacco: Not on file  . Alcohol Use: No  . Drug Use: No  . Sexual Activity:    Partners: Male   Birth Control/ Protection: Surgical     Comment: tubalization.   Other Topics Concern  . Not on file   Social History Narrative   Treadmill for exercise    Outpatient Prescriptions Prior to Visit  Medication Sig Dispense Refill  . aspirin 81 MG tablet Take 81 mg by mouth as needed.    . fluticasone (FLONASE) 50 MCG/ACT nasal spray INHALE 2 SPRAYS IN EACH NOSTRIL ONCE DAILY AS NEEDED 16 g 5  . Multiple Vitamin (MULTIVITAMIN) tablet Take 1 tablet by mouth daily.    . simvastatin (ZOCOR) 20 MG tablet Take 1 tablet (20 mg total) by mouth daily at 6 PM. 30 tablet 11   No facility-administered medications prior to visit.    Allergies  Allergen Reactions  . Codeine Nausea And Vomiting    Review of Systems  Constitutional: Negative for fever, chills, malaise/fatigue and diaphoresis.  HENT: Positive for congestion (sinus pressure).   Respiratory: Negative for cough, shortness of breath and wheezing.   Cardiovascular: Negative for chest pain, palpitations, orthopnea and leg swelling.  Neurological: Positive for headaches (sinus). Negative for weakness.   Objective:  BP 162/70 mmHg  Pulse 68  Temp(Src) 98.2 F (36.8 C) (Oral)  Resp 16  Wt 200 lb (90.719 kg)  LMP 11/28/2011  Physical Exam  Constitutional: She is well-developed, well-nourished, and in  no distress.  HENT:  Head: Normocephalic and atraumatic.  Right Ear: External ear normal.  Left Ear: External ear normal.  Nose: Nose normal.  Eyes: Conjunctivae are normal.  Neck: Neck supple. No tracheal deviation present. No thyromegaly present.  Cardiovascular: Normal rate, regular rhythm and normal heart sounds.   There appears to be a proximal carotid pulsatile aneurysm/pseudoaneurysm on the right just above the clavicle.  Pulmonary/Chest: Effort normal and breath sounds normal.  Lymphadenopathy:    She has no cervical adenopathy.  Neurological: She is alert. No cranial nerve deficit. Gait normal.  Skin: Skin is warm and  dry.  Psychiatric: Mood, memory, affect and judgment normal.    Assessment and Plan :  No diagnosis found. 1. Carotid aneurysm, right (HCC)/psudoaneurysm Patient advised to stop Sudafed and started on metoprolol twice a day to get blood pressure down acutely. CT angiogram of arranged  of the neck and patient will see vascular surgery after this is obtained.patient advised that for any neurologic deficit or pain to head straight to the emergency department via 911. - CT Angio Neck W/Cm &/Or Wo/Cm; Future - metoprolol tartrate (LOPRESSOR) 25 MG tablet; Take 1 tablet (25 mg total) by mouth 2 (two) times daily.  Dispense: 60 tablet; Refill: 3 - Ambulatory referral to Vascular Surgery  [facility] 09/05/2015 3:29 PM

## 2015-09-11 ENCOUNTER — Ambulatory Visit: Payer: BLUE CROSS/BLUE SHIELD | Admitting: Family Medicine

## 2015-09-19 ENCOUNTER — Ambulatory Visit: Payer: BLUE CROSS/BLUE SHIELD | Admitting: Family Medicine

## 2015-10-11 DIAGNOSIS — I720 Aneurysm of carotid artery: Secondary | ICD-10-CM | POA: Diagnosis not present

## 2015-11-02 ENCOUNTER — Telehealth: Payer: Self-pay | Admitting: Family Medicine

## 2015-11-02 NOTE — Telephone Encounter (Signed)
error 

## 2015-11-07 ENCOUNTER — Ambulatory Visit: Payer: BLUE CROSS/BLUE SHIELD | Admitting: Family Medicine

## 2015-12-05 ENCOUNTER — Other Ambulatory Visit: Payer: Self-pay

## 2015-12-05 DIAGNOSIS — I720 Aneurysm of carotid artery: Secondary | ICD-10-CM

## 2015-12-05 MED ORDER — METOPROLOL TARTRATE 25 MG PO TABS: 25.0000 mg | ORAL_TABLET | Freq: Two times a day (BID) | ORAL | Status: DC

## 2015-12-12 ENCOUNTER — Other Ambulatory Visit: Payer: Self-pay

## 2015-12-18 ENCOUNTER — Ambulatory Visit: Payer: BLUE CROSS/BLUE SHIELD | Admitting: Family Medicine

## 2016-01-19 ENCOUNTER — Other Ambulatory Visit: Payer: Self-pay | Admitting: Family Medicine

## 2016-01-29 ENCOUNTER — Other Ambulatory Visit: Payer: Self-pay | Admitting: Family Medicine

## 2016-02-05 ENCOUNTER — Encounter: Payer: Self-pay | Admitting: Family Medicine

## 2016-02-05 ENCOUNTER — Ambulatory Visit (INDEPENDENT_AMBULATORY_CARE_PROVIDER_SITE_OTHER): Payer: BLUE CROSS/BLUE SHIELD | Admitting: Family Medicine

## 2016-02-05 VITALS — BP 134/72 | HR 58 | Temp 98.3°F | Resp 16 | Wt 205.0 lb

## 2016-02-05 DIAGNOSIS — E785 Hyperlipidemia, unspecified: Secondary | ICD-10-CM | POA: Diagnosis not present

## 2016-02-05 DIAGNOSIS — I720 Aneurysm of carotid artery: Secondary | ICD-10-CM

## 2016-02-05 DIAGNOSIS — I10 Essential (primary) hypertension: Secondary | ICD-10-CM

## 2016-02-05 MED ORDER — METOPROLOL TARTRATE 25 MG PO TABS: 25.0000 mg | ORAL_TABLET | Freq: Two times a day (BID) | ORAL | 3 refills | Status: DC

## 2016-02-05 MED ORDER — SIMVASTATIN 20 MG PO TABS: 20.0000 mg | ORAL_TABLET | Freq: Every day | ORAL | 11 refills | Status: DC

## 2016-02-05 NOTE — Progress Notes (Signed)
Patient: Barbara Kaiser Female    DOB: 01/17/1956   60 y.o.   MRN: [id number] Visit Date: 02/05/2016  Today's Provider: [name], MD   Chief Complaint  Patient presents with  . Hypertension  . Hyperlipidemia   Subjective:    HPI  Hypertension, follow-up:  BP Readings from Last 3 Encounters:  02/05/16 134/72  09/05/15 (!) 162/70  08/07/15 134/82    She was last seen for hypertension 11 months ago.  BP at that visit was 134/82. Management since that visit includes no changes. She reports good compliance with treatment. She is not having side effects.  She is not exercising. She is adherent to low salt diet.   Outside blood pressures are checked occasionally. Normal per patient.  Patient denies chest pain, exertional chest pressure/discomfort, irregular heart beat, palpitations and tachypnea.   Cardiovascular risk factors include dyslipidemia.   Weight trend: stable Wt Readings from Last 3 Encounters:  02/05/16 205 lb (93 kg)  09/05/15 200 lb (90.7 kg)  08/07/15 199 lb (90.3 kg)    Current diet: well balanced  ------------------------------------------------------------------------   Lipid/Cholesterol, Follow-up:   Last seen for this11 months ago.  Management changes since that visit include no changes. . Last Lipid Panel:    Component Value Date/Time   CHOL 186 01/09/2015 0832   CHOL 238 (H) 03/03/2012 1649   TRIG 203.0 (H) 01/09/2015 0832   HDL 31.90 (L) 01/09/2015 0832   HDL 35 (L) 03/03/2012 1649   CHOLHDL 6 01/09/2015 0832   VLDL 40.6 (H) 01/09/2015 0832   LDLCALC 145 (H) 03/03/2012 1649   LDLDIRECT 136.0 01/09/2015 0832   She reports good compliance with treatment. She is not having side effects.  Current symptoms include none and have been stable. Weight trend: stable Prior visit with dietician: no Current diet: well balanced Current exercise: none  Wt Readings from Last 3 Encounters:  02/05/16 205 lb (93 kg)  09/05/15 200  lb (90.7 kg)  08/07/15 199 lb (90.3 kg)        Allergies  Allergen Reactions  . Codeine Nausea And Vomiting   Current Meds  Medication Sig  . aspirin 81 MG tablet Take 81 mg by mouth as needed.  . fluticasone (FLONASE) 50 MCG/ACT nasal spray INHALE 2 SPRAYS IN EACH NOSTRIL ONCE DAILY AS NEEDED  . metoprolol tartrate (LOPRESSOR) 25 MG tablet Take 1 tablet (25 mg total) by mouth 2 (two) times daily.  . Multiple Vitamin (MULTIVITAMIN) tablet Take 1 tablet by mouth daily.  . simvastatin (ZOCOR) 20 MG tablet Take 1 tablet (20 mg total) by mouth daily at 6 PM.    Review of Systems  Constitutional: Negative.   HENT: Negative.   Eyes: Negative.   Respiratory: Negative.   Cardiovascular: Negative.   Gastrointestinal: Negative.   Endocrine: Negative.   Musculoskeletal: Negative.   Allergic/Immunologic: Negative.   Neurological: Negative.   Psychiatric/Behavioral: Negative.     Social History  Substance Use Topics  . Smoking status: Former Smoker    Packs/day: 0.25    Years: 24.00    Quit date: 07/01/2001  . Smokeless tobacco: Not on file  . Alcohol use No   Objective:   BP 134/72 (BP Location: Right Arm, Patient Position: Sitting, Cuff Size: Normal)   Pulse (!) 58   Temp 98.3 F (36.8 C)   Resp 16   Wt 205 lb (93 kg)   LMP 11/28/2011   BMI 31.63 kg/m   Physical Exam  Constitutional: She is oriented to person, place, and time. She appears well-developed and well-nourished.  Pleasant, obese black female in no acute distress.  HENT:  Head: Normocephalic and atraumatic.  Right Ear: External ear normal.  Left Ear: External ear normal.  Nose: Nose normal.  Eyes: Conjunctivae are normal.  Neck: No thyromegaly present.  Cardiovascular: Normal rate, regular rhythm and normal heart sounds.   Pulmonary/Chest: Effort normal and breath sounds normal.  Lymphadenopathy:    She has no cervical adenopathy.  Neurological: She is alert and oriented to person, place, and time.    Skin: Skin is warm and dry.  Psychiatric: She has a normal mood and affect. Her behavior is normal. Judgment and thought content normal.        Assessment & Plan:     1. Carotid aneurysm, /normal CTA--dolichoectatic distal innominate artery  - metoprolol tartrate (LOPRESSOR) 25 MG tablet; Take 1 tablet (25 mg total) by mouth 2 (two) times daily.  Dispense: 180 tablet; Refill: 3  2. Hyperlipidemia  - Lipid panel - Comprehensive metabolic panel - simvastatin (ZOCOR) 20 MG tablet; Take 1 tablet (20 mg total) by mouth daily at 6 PM.  Dispense: 30 tablet; Refill: 11  3. HYPERTENSION, MILD  - CBC with Differential/Platelet - Comprehensive metabolic panel 4.Obesity      [name], MD  [facility]

## 2016-02-22 ENCOUNTER — Encounter: Payer: Self-pay | Admitting: Family Medicine

## 2016-05-28 ENCOUNTER — Other Ambulatory Visit: Payer: Self-pay | Admitting: Family Medicine

## 2016-05-28 DIAGNOSIS — Z1231 Encounter for screening mammogram for malignant neoplasm of breast: Secondary | ICD-10-CM

## 2016-06-03 ENCOUNTER — Other Ambulatory Visit: Payer: Self-pay

## 2016-06-03 DIAGNOSIS — E7849 Other hyperlipidemia: Secondary | ICD-10-CM

## 2016-06-03 MED ORDER — SIMVASTATIN 20 MG PO TABS: 20.0000 mg | ORAL_TABLET | Freq: Every day | ORAL | 3 refills | Status: DC

## 2016-06-06 ENCOUNTER — Ambulatory Visit (INDEPENDENT_AMBULATORY_CARE_PROVIDER_SITE_OTHER): Payer: BLUE CROSS/BLUE SHIELD | Admitting: Family Medicine

## 2016-06-06 VITALS — BP 122/80 | HR 82 | Temp 98.1°F | Resp 16 | Ht 68.0 in | Wt 206.0 lb

## 2016-06-06 DIAGNOSIS — Z0000 Encounter for general adult medical examination without abnormal findings: Secondary | ICD-10-CM

## 2016-06-06 DIAGNOSIS — Z1211 Encounter for screening for malignant neoplasm of colon: Secondary | ICD-10-CM

## 2016-06-06 NOTE — Progress Notes (Signed)
Patient: Barbara Kaiser, Female    DOB: [birth year], 58 y.o.   MRN: [id number] Visit Date: 06/06/2016  Today's Provider: [name], MD   Chief Complaint  Patient presents with  . Annual Exam   Subjective:  Barbara Kaiser is a 60 y.o. female who presents today for health maintenance and complete physical. She feels well. She reports exercising none. She reports she is sleeping well. She is the mother of 2 and the grandmother of 3--a 63 yo and 33  9year old. Immunization History  Administered Date(s) Administered  . Influenza-Unspecified 03/31/2013, 03/31/2014  . Td 03/21/2006   05/08/15 Mammogram, has one scheduled 06/17/16 02/04/2014 Colonoscopy-polyps, negative for malignancy 01/11/2015 Pap-s/p hysterectomy neverhad abnormal pap smear in the past.  Review of Systems  Constitutional: Negative.   HENT: Positive for sinus pressure, sore throat and tinnitus.   Eyes: Negative.   Respiratory: Negative.   Cardiovascular: Negative.   Gastrointestinal: Negative.   Endocrine: Negative.   Genitourinary: Negative.   Musculoskeletal: Negative.   Skin: Negative.   Allergic/Immunologic: Negative.   Neurological: Negative.   Hematological: Negative.   Psychiatric/Behavioral: Negative.     Social History   Social History  . Marital status: Single    Spouse name: N/A  . Number of children: 2  . Years of education: N/A   Occupational History  .  [employer] History Main Topics  . Smoking status: Former Smoker    Packs/day: 0.25    Years: 24.00    Quit date: 07/01/2001  . Smokeless tobacco: Not on file  . Alcohol use No  . Drug use: No  . Sexual activity: Not Currently    Partners: Male    Birth control/ protection: Surgical     Comment: tubalization.   Other Topics Concern  . Not on file   Social History Narrative   Treadmill for exercise    Patient Active Problem List   Diagnosis Date Noted  . Encounter for routine gynecological examination  01/11/2015  . Acute sinusitis 07/26/2014  . Benign paroxysmal positional vertigo 07/26/2014  . Personal history of colonic polyps 12/06/2013  . Obesity 12/06/2013  . Routine general medical examination at a health care facility 08/16/2012  . CAD (coronary artery disease) 12/05/2011  . Preoperative cardiovascular examination 12/05/2011  . Hyperglycemia 08/20/2011  . Vitamin D deficiency 08/20/2011  . Hyperlipidemia 06/21/2009  . HYPERTENSION, MILD 02/13/2007  . ALLERGIC RHINITIS 02/13/2007  . ASTHMA 02/13/2007    Past Surgical History:  Procedure Laterality Date  . CARDIAC CATHETERIZATION  05/2005   mild CAD  . DILATION AND CURETTAGE OF UTERUS  12/08   hysteroscopy and endometrial ablation  . LAPAROSCOPIC SUPRACERVICAL HYSTERECTOMY  12/09/2011   Procedure: LAPAROSCOPIC SUPRACERVICAL HYSTERECTOMY;  Surgeon: Osborne Oman, MD;  Location: [facility];  Service: Gynecology;  Laterality: N/A;  . NASAL SINUS SURGERY  11/07  . SALPINGOOPHORECTOMY  12/09/2011   Procedure: SALPINGO OOPHERECTOMY;  Surgeon: Osborne Oman, MD;  Location: [facility];  Service: Gynecology;  Laterality: Bilateral;  . SUPRACERVICAL ABDOMINAL HYSTERECTOMY  12/09/2011   Procedure: HYSTERECTOMY SUPRACERVICAL ABDOMINAL;  Surgeon: Osborne Oman, MD;  Location: [facility];  Service: Gynecology;  Laterality: N/A;  . TUBAL LIGATION      Her family history includes Cancer in her other; Cancer (age of onset: 1) in her mother; Diabetes in her other; Heart disease in her brother and other; Hypertension in her father and mother; Stroke in her other.     Outpatient Encounter Prescriptions  as of 06/06/2016  Medication Sig  . aspirin 81 MG tablet Take 81 mg by mouth as needed.  . fluticasone (FLONASE) 50 MCG/ACT nasal spray INHALE 2 SPRAYS IN EACH NOSTRIL ONCE DAILY AS NEEDED  . metoprolol tartrate (LOPRESSOR) 25 MG tablet Take 1 tablet (25 mg total) by mouth 2 (two) times daily.  . Multiple Vitamin (MULTIVITAMIN) tablet Take 1  tablet by mouth daily.  . simvastatin (ZOCOR) 20 MG tablet Take 1 tablet (20 mg total) by mouth daily.   No facility-administered encounter medications on file as of 06/06/2016.     Patient Care Team: Jerrol Banana., MD as PCP - General (Family Medicine)      Objective:   Vitals:  Vitals:   06/06/16 1416  BP: 122/80  Pulse: 82  Resp: 16  Temp: 98.1 F (36.7 C)  TempSrc: Oral  Weight: 206 lb (93.4 kg)  Height: 5\' 8"  (1.727 m)    Physical Exam  Constitutional: She is oriented to person, place, and time. She appears well-developed and well-nourished.  HENT:  Head: Normocephalic and atraumatic.  Right Ear: External ear normal.  Left Ear: External ear normal.  Nose: Nose normal.  Mouth/Throat: Oropharynx is clear and moist.  Eyes: Conjunctivae and EOM are normal. Pupils are equal, round, and reactive to light.  Neck: Normal range of motion. Neck supple.  Cardiovascular: Normal rate, regular rhythm, normal heart sounds and intact distal pulses.   Pulse in right lower anterior neck.  Pulmonary/Chest: Effort normal and breath sounds normal.  Abdominal: Soft. Bowel sounds are normal.  Genitourinary:  Genitourinary Comments: Normal external genitalia.  DRE normal.  Musculoskeletal: Normal range of motion.  Neurological: She is alert and oriented to person, place, and time.  Skin: Skin is warm and dry.  Psychiatric: She has a normal mood and affect. Her behavior is normal. Judgment and thought content normal.     Depression Screen PHQ 2/9 Scores 06/06/2016 12/06/2013 08/19/2012  PHQ - 2 Score 0 0 0   Fall Risk  06/06/2016  Falls in the past year? No   Functional Status Survey: Is the patient deaf or have difficulty hearing?: No Does the patient have difficulty seeing, even when wearing glasses/contacts?: No Does the patient have difficulty concentrating, remembering, or making decisions?: No Does the patient have difficulty walking or climbing stairs?: No Does the  patient have difficulty dressing or bathing?: No Does the patient have difficulty doing errands alone such as visiting a doctor's office or shopping?: No  Current Exercise Habits: The patient does not participate in regular exercise at present        Assessment & Plan:     Routine Health Maintenance and Physical Exam  Exercise Activities and Dietary recommendations Goals    None      Immunization History  Administered Date(s) Administered  . Influenza-Unspecified 03/31/2013, 03/31/2014  . Td 03/21/2006    Health Maintenance  Topic Date Due  . TETANUS/TDAP  03/21/2016  . MAMMOGRAM  05/07/2016  . PAP SMEAR  01/10/2018  . COLONOSCOPY  01/29/2019  . INFLUENZA VACCINE  Addressed  . Hepatitis C Screening  Addressed  . HIV Screening  Addressed    Pelvic next year--no Pap needed. Discussed health benefits of physical activity, and encouraged her to engage in regular exercise appropriate for her age and condition.     HPI, Exam and A&P Transcribed under the direction and in the presence of [name]., MD. Electronically Signed: Althea Charon, RMA I have done  the exam and reviewed the chart and it is accurate to the best of my knowledge. Development worker, community has been used and  any errors in dictation or transcription are unintentional. Miguel Aschoff M.D. [facility]

## 2016-06-10 LAB — IFOBT (OCCULT BLOOD): IFOBT: NEGATIVE

## 2016-06-17 ENCOUNTER — Ambulatory Visit
Admission: RE | Admit: 2016-06-17 | Discharge: 2016-06-17 | Disposition: A | Discharge status: Home / Self Care | Payer: BLUE CROSS/BLUE SHIELD | Source: Ambulatory Visit | Attending: Family Medicine | Admitting: Family Medicine

## 2016-06-17 DIAGNOSIS — Z1231 Encounter for screening mammogram for malignant neoplasm of breast: Secondary | ICD-10-CM | POA: Insufficient documentation

## 2016-08-09 LAB — CBC AND DIFFERENTIAL
HEMATOCRIT: 41 % (ref 36–46)
HEMOGLOBIN: 13.2 g/dL (ref 12.0–16.0)
Neutrophils Absolute: 3 /uL
Platelets: 273 10*3/uL (ref 150–399)
WBC: 6.2 10*3/mL

## 2016-08-09 LAB — LIPID PANEL
CHOLESTEROL: 150 mg/dL (ref 0–200)
HDL: 37 mg/dL (ref 35–70)
LDL Cholesterol: 91 mg/dL
LDl/HDL Ratio: 4.1
TRIGLYCERIDES: 111 mg/dL (ref 40–160)

## 2016-08-09 LAB — HEPATIC FUNCTION PANEL
ALT: 16 U/L (ref 7–35)
AST: 16 U/L (ref 13–35)
Alkaline Phosphatase: 99 U/L (ref 25–125)
Bilirubin, Total: 0.4 mg/dL

## 2016-08-09 LAB — BASIC METABOLIC PANEL
BUN: 11 mg/dL (ref 4–21)
Creatinine: 0.8 mg/dL (ref 0.5–1.1)
Glucose: 104 mg/dL
POTASSIUM: 4.5 mmol/L (ref 3.4–5.3)
SODIUM: 144 mmol/L (ref 137–147)

## 2016-08-09 LAB — TSH: TSH: 2.12 u[IU]/mL (ref 0.41–5.90)

## 2016-08-28 ENCOUNTER — Encounter: Payer: Self-pay | Admitting: Family Medicine

## 2016-09-04 ENCOUNTER — Ambulatory Visit: Payer: Self-pay | Admitting: Medical

## 2016-09-04 ENCOUNTER — Encounter: Payer: Self-pay | Admitting: Medical

## 2016-09-04 VITALS — BP 122/78 | HR 71 | Temp 97.9°F | Resp 16 | Ht 68.0 in | Wt 205.0 lb

## 2016-09-04 DIAGNOSIS — J0100 Acute maxillary sinusitis, unspecified: Secondary | ICD-10-CM

## 2016-09-04 MED ORDER — AMOXICILLIN-POT CLAVULANATE 875-125 MG PO TABS: 1.0000 | ORAL_TABLET | Freq: Two times a day (BID) | ORAL | 0 refills | Status: DC

## 2016-09-04 NOTE — Patient Instructions (Signed)
Return to clinic in 3-5 d if not improving. Use flonase every other day. Corcidan hBP take as directed. Do not take sudafed.

## 2016-09-04 NOTE — Progress Notes (Signed)
   Subjective:    Patient ID: Barbara Kaiser, female    DOB: 1956/03/20, 61 y.o.   MRN: [id number]  HPI Started last week, worsening yesterday having right sided pressure maxillary area and forehead. Not much coming out as far as discharge.    Review of Systems  Constitutional: Negative for chills and fever.  HENT: Positive for congestion, sinus pain and sinus pressure. Negative for ear pain, postnasal drip and sore throat.   Eyes: Negative for discharge.  Respiratory: Negative for shortness of breath.   Cardiovascular: Negative for chest pain.   History of nasal polyps both side surgery more than 78yrs.    Objective:   Physical Exam  Constitutional: She appears well-developed and well-nourished.  HENT:  Head: Normocephalic and atraumatic.  Nose: Mucosal edema and sinus tenderness present. Right sinus exhibits no frontal sinus tenderness. Left sinus exhibits no maxillary sinus tenderness and no frontal sinus tenderness.  Eyes: Pupils are equal, round, and reactive to light.  Neck: Normal range of motion. Neck supple.  Cardiovascular: Normal rate, regular rhythm and normal heart sounds.   small sore noted on the inside left nare, medially. No cervical nodes noted. Green discharge noted in right nare. Turbinate swelling noted on the left side.        Assessment & Plan:  Sinusitis Augmentin take as directed. Return to the clinic if not improving in  3-5 days. flonase every other day for now. Stop sudafed and use coricidan hBP take as directed. Drink plenty of fluids.

## 2016-10-15 ENCOUNTER — Ambulatory Visit: Payer: BLUE CROSS/BLUE SHIELD | Admitting: Family Medicine

## 2016-10-16 ENCOUNTER — Encounter: Payer: Self-pay | Admitting: Physician Assistant

## 2016-10-16 ENCOUNTER — Ambulatory Visit: Payer: BLUE CROSS/BLUE SHIELD | Admitting: Family Medicine

## 2016-10-16 ENCOUNTER — Ambulatory Visit (INDEPENDENT_AMBULATORY_CARE_PROVIDER_SITE_OTHER): Payer: BLUE CROSS/BLUE SHIELD | Admitting: Physician Assistant

## 2016-10-16 VITALS — BP 122/60 | HR 80 | Temp 98.4°F | Resp 16 | Wt 208.0 lb

## 2016-10-16 DIAGNOSIS — M5442 Lumbago with sciatica, left side: Secondary | ICD-10-CM

## 2016-10-16 DIAGNOSIS — M5441 Lumbago with sciatica, right side: Secondary | ICD-10-CM

## 2016-10-16 MED ORDER — PREDNISONE 20 MG PO TABS: 20.0000 mg | ORAL_TABLET | Freq: Every day | ORAL | 0 refills | Status: AC

## 2016-10-16 NOTE — Patient Instructions (Addendum)
Outpatient imaging center on [address] for back xray when you can    Herniated Disk A herniated disk is when a disk in your spine bulges out too far. There is a disk with a spongy center in between each pair of bones in the spine (vertebrae). These disks act as shock absorbers when you move. A herniated disk can cause pain and muscle weakness. This can happen anywhere in the back or neck. Follow these instructions at home: Medicines   Take over-the-counter and prescription medicines only as told by your doctor.  Do not drive or use heavy machinery while taking prescription pain medicine. Activity   Rest as told by your doctor.  After your rest period:  Return to your normal activities. Slowly start exercising as told by your doctor. Ask what activities are safe for you.  Use good posture.  Avoid movements that cause pain.  Do not lift anything that is heavier than 10 lb (4.5 kg) until your doctor says this is safe.  Do not sit or stand for a long time without moving.  Do not sit for a long time without getting up and moving around.  Do exercises (physical therapy) as told.  Try to strengthen your back and belly (abdomen) with exercises like crunches, swimming, or walking. General instructions   Do not use any products that contain nicotine or tobacco, such as cigarettes and e-cigarettes. If you need help quitting, ask your doctor.  Do not wear high-heeled shoes.  Do not sleep on your belly.  If you are overweight, work with your doctor to lose weight safely.  To prevent or treat constipation while you are taking prescription pain medicine, your doctor may recommend that you:  Drink enough fluid to keep your pee (urine) clear or pale yellow.  Take over-the-counter or prescription medicines.  Eat foods that are high in fiber. These include fresh fruits and vegetables, whole grains, and beans.  Limit foods that are high in fat and processed sugars. These include  fried and sweet foods.  Keep all follow-up visits as told by your doctor. This is important. How is this prevented?  Stay at a healthy weight.  Try to avoid stress.  Stay in shape. Do at least 150 minutes of moderate-intensity exercise each week, such as fast walking or water aerobics.  When lifting objects:  Keep your feet as far apart as your shoulders (shoulder-width apart) or farther apart.  Tighten your belly muscles.  Bend your knees and hips and keep your spine neutral. Lift using the strength of your legs, not your back. Do not lock your knees straight out.  Always ask for help to lift heavy or awkward objects. Contact a doctor if:  You have back pain or neck pain that does not get better after 6 weeks.  You have very bad pain.  You get any of these problems in any part of your body:  Tingling.  Weakness.  Loss of feeling (numbness). Get help right away if:  You cannot move your arms or legs.  You cannot control when you pee (urinate) or poop (have a bowel movement).  You feel dizzy.  You faint.  You have trouble breathing. This information is not intended to replace advice given to you by your health care provider. Make sure you discuss any questions you have with your health care provider. Document Released: 11/01/2013 Document Revised: 02/14/2016 Document Reviewed: 12/14/2015 Elsevier Interactive Patient Education  2017 [employer].

## 2016-10-16 NOTE — Progress Notes (Signed)
Patient: Barbara Kaiser Female    DOB: [birth year]   61 y.o.   MRN: [id number] Visit Date: 10/17/2016  Today's Provider: Trinna Post, PA-C   Chief Complaint  Patient presents with  . Back Pain    Started over a month ago   Subjective:    Back Pain  This is a chronic problem. The current episode started more than 1 month ago. The problem occurs constantly. The problem has been gradually worsening since onset. The pain is present in the lumbar spine. The quality of the pain is described as aching. Radiates to: Radiates to both of her legs.  The pain is at a severity of 6/10 (Has been as high as 10/10). The pain is worse during the day. Exacerbated by: Walking make the pain worse.  Stiffness is present in the morning. Associated symptoms include numbness (Bilateral leg numbness). Pertinent negatives include no bladder incontinence, bowel incontinence, dysuria, headaches or weakness.   Barbara Kaiser is a 61 y/o woman with PMH significant for obesity and HTN presenting today for backpain ongoing greater than four weeks. She locates the pain to her low back. She does not remember an inciting event. She describes radiation of pain/numbness/tingling down the lateral aspects of both legs. She says the pain worsens when walking. It fluctuates - sometimes it may not worse, but with walking she may feel it a lot. She does not have any bladder or bowel incontinence, urinary or stool retention, saddle anesthesia, difficulty walking.     Allergies  Allergen Reactions  . Codeine Nausea And Vomiting     Current Outpatient Prescriptions:  .  aspirin 81 MG tablet, Take 81 mg by mouth as needed., Disp: , Rfl:  .  fluticasone (FLONASE) 50 MCG/ACT nasal spray, INHALE 2 SPRAYS IN EACH NOSTRIL ONCE DAILY AS NEEDED, Disp: 16 g, Rfl: 5 .  metoprolol tartrate (LOPRESSOR) 25 MG tablet, Take 1 tablet (25 mg total) by mouth 2 (two) times daily., Disp: 180 tablet, Rfl: 3 .  Multiple Vitamin (MULTIVITAMIN)  tablet, Take 1 tablet by mouth daily., Disp: , Rfl:  .  simvastatin (ZOCOR) 20 MG tablet, Take 1 tablet (20 mg total) by mouth daily., Disp: 90 tablet, Rfl: 3 .  predniSONE (DELTASONE) 20 MG tablet, Take 1 tablet (20 mg total) by mouth daily with breakfast., Disp: 5 tablet, Rfl: 0  Review of Systems  Gastrointestinal: Negative.  Negative for bowel incontinence.  Genitourinary: Negative for bladder incontinence, decreased urine volume, difficulty urinating, dyspareunia, dysuria, enuresis, flank pain, frequency, hematuria, urgency, vaginal bleeding, vaginal discharge and vaginal pain.  Musculoskeletal: Positive for back pain and gait problem. Negative for arthralgias, joint swelling, myalgias, neck pain and neck stiffness.  Neurological: Positive for numbness (Bilateral leg numbness). Negative for dizziness, weakness, light-headedness and headaches.    Social History  Substance Use Topics  . Smoking status: Former Smoker    Packs/day: 0.25    Years: 24.00    Quit date: 07/01/2001  . Smokeless tobacco: Never Used  . Alcohol use No   Objective:   BP 122/60 (BP Location: Left Arm, Patient Position: Sitting, Cuff Size: Large)   Pulse 80   Temp 98.4 F (36.9 C) (Oral)   Resp 16   Wt 208 lb (94.3 kg)   LMP 11/28/2011   BMI 31.63 kg/m  Vitals:   10/16/16 1638  BP: 122/60  Pulse: 80  Resp: 16  Temp: 98.4 F (36.9 C)  TempSrc: Oral  Weight: 208 lb (  94.3 kg)     Physical Exam  Constitutional: She is oriented to person, place, and time. She appears well-developed and well-nourished.  Abdominal: There is no CVA tenderness.  Musculoskeletal:  No bony tenderness along entirety of spine or muscle spasms.  Neurological: She is alert and oriented to person, place, and time. She has normal strength. No sensory deficit. Coordination and gait normal.  Reflex Scores:      Patellar reflexes are 2+ on the right side and 2+ on the left side.      Achilles reflexes are 2+ on the right side and  2+ on the left side. Negative straight leg raise bilaterally. She has sensation grossly in tact to bilateral lower extremity. Full strength in dorsiflexion and plantarflexion bilaterally, as well as remainder of BLE. She ambulates with some difficulty to the exam table.  Skin: Skin is warm and dry.  Psychiatric: She has a normal mood and affect. Her behavior is normal.        Assessment & Plan:     1. Acute bilateral low back pain with bilateral sciatica  LBP ongoing for 4 weeks, suspect disc herniation, from patient's description of path of radiation, possibly lumbar. Will have her get imagine as below and take short course of steroids to help with any inflammation. Counseled she may not take NSAIDs with this but may take Tylenol. Will refer to Dr. Sharlet Salina for more conservative management/further imaging if needed.   - predniSONE (DELTASONE) 20 MG tablet; Take 1 tablet (20 mg total) by mouth daily with breakfast.  Dispense: 5 tablet; Refill: 0 - DG Lumbar Spine Complete; Future - Ambulatory referral to Orthopedics  The entirety of the information documented in the History of Present Illness, Review of Systems and Physical Exam were personally obtained by me. Portions of this information were initially documented by Ashley Royalty, CMA and reviewed by me for thoroughness and accuracy.   Return if symptoms worsen or fail to improve.        Trinna Post, PA-C  [facility]

## 2016-10-17 ENCOUNTER — Ambulatory Visit
Admission: RE | Admit: 2016-10-17 | Discharge: 2016-10-17 | Disposition: A | Discharge status: Home / Self Care | Payer: BLUE CROSS/BLUE SHIELD | Source: Ambulatory Visit | Attending: Physician Assistant | Admitting: Physician Assistant

## 2016-10-17 DIAGNOSIS — M47816 Spondylosis without myelopathy or radiculopathy, lumbar region: Secondary | ICD-10-CM | POA: Diagnosis not present

## 2016-10-17 DIAGNOSIS — M5441 Lumbago with sciatica, right side: Secondary | ICD-10-CM | POA: Diagnosis not present

## 2016-10-17 DIAGNOSIS — M545 Low back pain: Secondary | ICD-10-CM | POA: Diagnosis not present

## 2016-10-17 DIAGNOSIS — M5442 Lumbago with sciatica, left side: Secondary | ICD-10-CM | POA: Diagnosis not present

## 2016-10-18 ENCOUNTER — Telehealth: Payer: Self-pay

## 2016-10-18 NOTE — Telephone Encounter (Signed)
-----   Message from Trinna Post, [place] sent at 10/18/2016  8:29 AM EDT ----- Some mild arthritic changes, no fractures. Some mild slipping forward of vertebrae above one another. Looks like Orthopedics Dr. Sharlet Salina office will contact patient.

## 2016-10-18 NOTE — Telephone Encounter (Signed)
Pt advised.  She has an appointment Dr. Sharlet Salina Wednesday.  Thanks,   -Mickel Baas

## 2016-11-06 ENCOUNTER — Other Ambulatory Visit: Payer: Self-pay | Admitting: Family Medicine

## 2016-11-26 ENCOUNTER — Ambulatory Visit: Payer: Self-pay | Admitting: Family Medicine

## 2016-11-26 ENCOUNTER — Encounter: Payer: Self-pay | Admitting: Family Medicine

## 2016-11-26 VITALS — BP 135/80 | HR 78 | Temp 97.8°F | Resp 16 | Ht 68.0 in | Wt 205.0 lb

## 2016-11-26 DIAGNOSIS — J0101 Acute recurrent maxillary sinusitis: Secondary | ICD-10-CM

## 2016-11-26 MED ORDER — AMOXICILLIN-POT CLAVULANATE 875-125 MG PO TABS: 1.0000 | ORAL_TABLET | Freq: Two times a day (BID) | ORAL | 0 refills | Status: DC

## 2016-11-26 MED ORDER — FLUTICASONE PROPIONATE 50 MCG/ACT NA SUSP: NASAL | 11 refills | Status: DC

## 2016-11-26 NOTE — Assessment & Plan Note (Signed)
Continue OTC regimen, can add plain mucinex, sinus rinses, zyrtec. Will send augmentin if no relief over next few days.

## 2016-11-26 NOTE — Progress Notes (Signed)
   BP 135/80   Pulse 78   Temp 97.8 F (36.6 C)   Resp 16   Ht 5\' 8"  (1.727 m)   Wt 205 lb (93 kg)   LMP 11/28/2011   SpO2 98%   BMI 31.17 kg/m    Subjective:    Patient ID: Derrill Memo, female    DOB: 11-23-1955, 61 y.o.   MRN: [id number]  HPI: Antonisha Waskey is a 61 y.o. female  Chief Complaint  Patient presents with  . Sinus Problem   Patient presents with 4-5 days of b/l sinus pain/pressure, congestion with thick yellow mucus, and mild sinus HA. Left side worse than right. Denies fever, chills, aches, cough. Trying sudafed, advil, and flonase with some relief. S/p 2 nasal/sinus surgeries and with hx of allergies and recurrent sinusitis. Denies sick contacts.   Relevant past medical, surgical, family and social history reviewed and updated as indicated. Interim medical history since our last visit reviewed. Allergies and medications reviewed and updated.  Review of Systems  Constitutional: Negative.   HENT: Positive for congestion, sinus pain and sinus pressure.   Respiratory: Negative.   Cardiovascular: Negative.   Gastrointestinal: Negative.   Genitourinary: Negative.   Musculoskeletal: Negative.   Neurological: Positive for headaches.  Psychiatric/Behavioral: Negative.     Per HPI unless specifically indicated above     Objective:    BP 135/80   Pulse 78   Temp 97.8 F (36.6 C)   Resp 16   Ht 5\' 8"  (1.727 m)   Wt 205 lb (93 kg)   LMP 11/28/2011   SpO2 98%   BMI 31.17 kg/m   Wt Readings from Last 3 Encounters:  11/26/16 205 lb (93 kg)  10/16/16 208 lb (94.3 kg)  09/04/16 205 lb (93 kg)    Physical Exam  Constitutional: She is oriented to person, place, and time. She appears well-developed and well-nourished. No distress.  HENT:  Head: Atraumatic.  Right Ear: External ear normal.  Left Ear: External ear normal.  Maxillary sinuses TTP Oropharynx erythematous posteriorly Nasal mucosa injected with thick drainage present  Neck: Normal range of  motion. Neck supple.  Cardiovascular: Normal rate and normal heart sounds.   Pulmonary/Chest: Breath sounds normal. No respiratory distress. She has no wheezes.  Musculoskeletal: Normal range of motion.  Lymphadenopathy:    She has no cervical adenopathy.  Neurological: She is alert and oriented to person, place, and time. No cranial nerve deficit.  Skin: Skin is warm and dry.  Psychiatric: She has a normal mood and affect. Her behavior is normal.  Nursing note and vitals reviewed.     Assessment & Plan:   Problem List Items Addressed This Visit      Respiratory   Acute sinusitis - Primary    Continue OTC regimen, can add plain mucinex, sinus rinses, zyrtec. Will send augmentin if no relief over next few days.       Relevant Medications   amoxicillin-clavulanate (AUGMENTIN) 875-125 MG tablet   fluticasone (FLONASE) 50 MCG/ACT nasal spray       Follow up plan: Return if symptoms worsen or fail to improve.

## 2016-12-18 ENCOUNTER — Other Ambulatory Visit: Payer: Self-pay | Admitting: Physical Medicine and Rehabilitation

## 2016-12-18 DIAGNOSIS — M48062 Spinal stenosis, lumbar region with neurogenic claudication: Secondary | ICD-10-CM | POA: Diagnosis not present

## 2016-12-18 DIAGNOSIS — M5136 Other intervertebral disc degeneration, lumbar region: Secondary | ICD-10-CM | POA: Diagnosis not present

## 2016-12-18 DIAGNOSIS — M5416 Radiculopathy, lumbar region: Secondary | ICD-10-CM | POA: Diagnosis not present

## 2016-12-23 ENCOUNTER — Ambulatory Visit
Admission: RE | Admit: 2016-12-23 | Discharge: 2016-12-23 | Disposition: A | Discharge status: Home / Self Care | Payer: BLUE CROSS/BLUE SHIELD | Source: Ambulatory Visit | Attending: Physical Medicine and Rehabilitation | Admitting: Physical Medicine and Rehabilitation

## 2016-12-23 DIAGNOSIS — M48061 Spinal stenosis, lumbar region without neurogenic claudication: Secondary | ICD-10-CM | POA: Diagnosis not present

## 2016-12-23 DIAGNOSIS — M5416 Radiculopathy, lumbar region: Secondary | ICD-10-CM | POA: Diagnosis not present

## 2017-01-30 ENCOUNTER — Other Ambulatory Visit: Payer: Self-pay | Admitting: Family Medicine

## 2017-01-30 DIAGNOSIS — I720 Aneurysm of carotid artery: Secondary | ICD-10-CM

## 2017-02-12 ENCOUNTER — Ambulatory Visit: Payer: Self-pay | Admitting: Adult Health

## 2017-02-12 ENCOUNTER — Encounter: Payer: Self-pay | Admitting: Adult Health

## 2017-02-12 VITALS — BP 122/76 | HR 65 | Temp 98.5°F

## 2017-02-12 DIAGNOSIS — H6121 Impacted cerumen, right ear: Secondary | ICD-10-CM

## 2017-02-12 DIAGNOSIS — H6120 Impacted cerumen, unspecified ear: Secondary | ICD-10-CM | POA: Insufficient documentation

## 2017-02-12 NOTE — Patient Instructions (Signed)
Earwax Buildup, Adult The ears produce a substance called earwax that helps keep bacteria out of the ear and protects the skin in the ear canal. Occasionally, earwax can build up in the ear and cause discomfort or hearing loss. What increases the risk? This condition is more likely to develop in people who:  Are female.  Are elderly.  Naturally produce more earwax.  Clean their ears often with cotton swabs.  Use earplugs often.  Use in-ear headphones often.  Wear hearing aids.  Have narrow ear canals.  Have earwax that is overly thick or sticky.  Have eczema.  Are dehydrated.  Have excess hair in the ear canal.  What are the signs or symptoms? Symptoms of this condition include:  Reduced or muffled hearing.  A feeling of fullness in the ear or feeling that the ear is plugged.  Fluid coming from the ear.  Ear pain.  Ear itch.  Ringing in the ear.  Coughing.  An obvious piece of earwax that can be seen inside the ear canal.  How is this diagnosed? This condition may be diagnosed based on:  Your symptoms.  Your medical history.  An ear exam. During the exam, your health care provider will look into your ear with an instrument called an otoscope.  You may have tests, including a hearing test. How is this treated? This condition may be treated by:  Using ear drops to soften the earwax.  Having the earwax removed by a health care provider. The health care provider may: ? Flush the ear with water. ? Use an instrument that has a loop on the end (curette). ? Use a suction device.  Surgery to remove the wax buildup. This may be done in severe cases.  Follow these instructions at home:  Take over-the-counter and prescription medicines only as told by your health care provider.  Do not put any objects, including cotton swabs, into your ear. You can clean the opening of your ear canal with a washcloth or facial tissue.  Follow instructions from your health  care provider about cleaning your ears. Do not over-clean your ears.  Drink enough fluid to keep your urine clear or pale yellow. This will help to thin the earwax.  Keep all follow-up visits as told by your health care provider. If earwax builds up in your ears often or if you use hearing aids, consider seeing your health care provider for routine, preventive ear cleanings. Ask your health care provider how often you should schedule your cleanings.  If you have hearing aids, clean them according to instructions from the manufacturer and your health care provider. Contact a health care provider if:  You have ear pain.  You develop a fever.  You have blood, pus, or other fluid coming from your ear.  You have hearing loss.  You have ringing in your ears that does not go away.  Your symptoms do not improve with treatment.  You feel like the room is spinning (vertigo). Summary  Earwax can build up in the ear and cause discomfort or hearing loss.  The most common symptoms of this condition include reduced or muffled hearing and a feeling of fullness in the ear or feeling that the ear is plugged.  This condition may be diagnosed based on your symptoms, your medical history, and an ear exam.  This condition may be treated by using ear drops to soften the earwax or by having the earwax removed by a health care provider.  Do   not put any objects, including cotton swabs, into your ear. You can clean the opening of your ear canal with a washcloth or facial tissue. This information is not intended to replace advice given to you by your health care provider. Make sure you discuss any questions you have with your health care provider. Document Released: 07/25/2004 Document Revised: 08/28/2016 Document Reviewed: 08/28/2016 Elsevier Interactive Patient Education  2018 [employer]  

## 2017-02-12 NOTE — Progress Notes (Signed)
Subjective:     Patient ID: Barbara Kaiser, female   DOB: 09/08/1955, 61 y.o.   MRN: [id number]  HPI  Patient is a 61 year old female with pain and fullness in her right ear for the past week. Denies any swimming. Denies any fluid or drainage.Denies any fevers, nausea, vomiting, diarrhea.   Allergies  Allergen Reactions  . Codeine Nausea And Vomiting     Vitals:   02/12/17 1306  BP: 122/76  Pulse: 65  Temp: 98.5 F (36.9 C)  SpO2: 96%     Review of Systems  Constitutional: Negative.  Negative for activity change, appetite change, chills, diaphoresis, fatigue, fever and unexpected weight change.  HENT: Negative for congestion, dental problem, drooling, ear discharge, ear pain (right ear fullness ), facial swelling, hearing loss, mouth sores, nosebleeds, postnasal drip, rhinorrhea, sinus pain, sinus pressure, sneezing, sore throat, tinnitus, trouble swallowing and voice change.   Eyes: Negative for photophobia, pain, discharge, redness, itching and visual disturbance.  Respiratory: Negative for apnea, cough, choking, chest tightness, shortness of breath, wheezing and stridor.   Cardiovascular: Negative for chest pain, palpitations and leg swelling.  Gastrointestinal: Negative for abdominal distention, abdominal pain, anal bleeding, blood in stool, constipation, diarrhea, nausea, rectal pain and vomiting.  Endocrine: Negative for cold intolerance, heat intolerance, polydipsia, polyphagia and polyuria.  Genitourinary: Negative for decreased urine volume, difficulty urinating, dyspareunia, dysuria, enuresis, flank pain, frequency, genital sores, hematuria, menstrual problem, pelvic pain, urgency, vaginal bleeding, vaginal discharge and vaginal pain.  Musculoskeletal: Negative for arthralgias, back pain, gait problem, joint swelling, myalgias, neck pain and neck stiffness.  Skin: Negative for color change, pallor, rash and wound.  Allergic/Immunologic: Negative for environmental allergies,  food allergies and immunocompromised state.  Neurological: Negative for dizziness, tremors, seizures, syncope, facial asymmetry, speech difficulty, weakness, light-headedness, numbness and headaches.  Hematological: Negative for adenopathy. Does not bruise/bleed easily.  Psychiatric/Behavioral: Negative for agitation, behavioral problems, confusion, decreased concentration, dysphoric mood, hallucinations, self-injury, sleep disturbance and suicidal ideas. The patient is not nervous/anxious and is not hyperactive.           Objective:   Physical Exam  Constitutional: She is oriented to person, place, and time. She appears well-developed and well-nourished. No distress.  HENT:  Head: Normocephalic and atraumatic.  Right Ear: Hearing, tympanic membrane, external ear and ear canal normal. No lacerations. No drainage, swelling or tenderness. No foreign bodies. No mastoid tenderness. Tympanic membrane is not injected, not scarred, not perforated, not erythematous, not retracted and not bulging. Tympanic membrane mobility is normal. No middle ear effusion. No hemotympanum. No decreased hearing is noted.  Left Ear: Hearing, tympanic membrane and ear canal normal. No lacerations. No drainage, swelling or tenderness. No foreign bodies. No mastoid tenderness. Tympanic membrane is not injected, not scarred, not perforated, not erythematous, not retracted and not bulging. Tympanic membrane mobility is normal.  No middle ear effusion. No hemotympanum. No decreased hearing is noted.  Nose: Nose normal. No mucosal edema or rhinorrhea. Right sinus exhibits no maxillary sinus tenderness and no frontal sinus tenderness. Left sinus exhibits no maxillary sinus tenderness and no frontal sinus tenderness.  Mouth/Throat: Uvula is midline, oropharynx is clear and moist and mucous membranes are normal. Mucous membranes are not pale, not dry and not cyanotic. No oropharyngeal exudate.  Right tympanic membrane unable to  visualize due to cerumen impaction.  Right ear irrigated by Durward Mallard, wax was irrigated and cleared.  Patient reports she tolerated without any pain or difficulty, was then able to  visualize right tympanic membrane and it was normal as documented.   Eyes: Pupils are equal, round, and reactive to light. Conjunctivae and EOM are normal. Right eye exhibits no discharge. Left eye exhibits no discharge. No scleral icterus.  Neck: Normal range of motion. Neck supple. No JVD present. No tracheal deviation present. No thyromegaly present.  Cardiovascular: Normal rate, regular rhythm, normal heart sounds and intact distal pulses.  Exam reveals no gallop and no friction rub.   No murmur heard. Pulmonary/Chest: Effort normal and breath sounds normal. No stridor. No respiratory distress. She has no wheezes. She has no rales. She exhibits no tenderness.  Abdominal: Soft. Bowel sounds are normal.  Musculoskeletal: Normal range of motion. She exhibits no edema, tenderness or deformity.  Lymphadenopathy:    She has no cervical adenopathy.  Neurological: She is alert and oriented to person, place, and time. She has normal reflexes.  Skin: Skin is warm, dry and intact. No rash noted. She is not diaphoretic. No erythema. No pallor.  Psychiatric: She has a normal mood and affect. Her speech is normal and behavior is normal. Judgment and thought content normal. Cognition and memory are normal.       Assessment:     Ceruminosis is noted right ear.  Wax is removed by syringing and manual debridement. Instructions for home care to prevent wax buildup are given.   Plan:     1. Use Debrox over the counter as needed to prevent wax build up per package instructions. Call office if any signs of infection, fever, pain or any new symptoms occur at any time for an appointment. Advised not to use Q-tips. Go to urgent care if after hours and any new symptoms or symptoms worsen. Patient verbalized understanding of all of  the above instructions.

## 2017-02-13 DIAGNOSIS — M5136 Other intervertebral disc degeneration, lumbar region: Secondary | ICD-10-CM | POA: Diagnosis not present

## 2017-02-13 DIAGNOSIS — M5416 Radiculopathy, lumbar region: Secondary | ICD-10-CM | POA: Diagnosis not present

## 2017-02-13 DIAGNOSIS — M48062 Spinal stenosis, lumbar region with neurogenic claudication: Secondary | ICD-10-CM | POA: Diagnosis not present

## 2017-03-17 ENCOUNTER — Other Ambulatory Visit: Payer: Self-pay | Admitting: Family Medicine

## 2017-03-17 DIAGNOSIS — I720 Aneurysm of carotid artery: Secondary | ICD-10-CM

## 2017-04-09 DIAGNOSIS — L659 Nonscarring hair loss, unspecified: Secondary | ICD-10-CM | POA: Diagnosis not present

## 2017-04-28 ENCOUNTER — Encounter: Payer: Self-pay | Admitting: Medical

## 2017-04-28 ENCOUNTER — Ambulatory Visit: Payer: Self-pay | Admitting: Medical

## 2017-04-28 VITALS — BP 140/80 | HR 53 | Temp 97.4°F | Wt 199.0 lb

## 2017-04-28 DIAGNOSIS — R42 Dizziness and giddiness: Secondary | ICD-10-CM

## 2017-04-28 DIAGNOSIS — H6983 Other specified disorders of Eustachian tube, bilateral: Secondary | ICD-10-CM

## 2017-04-28 DIAGNOSIS — B349 Viral infection, unspecified: Secondary | ICD-10-CM

## 2017-04-28 LAB — GLUCOSE, POCT (MANUAL RESULT ENTRY): POC Glucose: 119 mg/dl — AB (ref 70–99)

## 2017-04-28 NOTE — Patient Instructions (Addendum)
Stop Sudafed, Stop  Slim 180. Take OTC plain Mucinex  For runny nose and ears. Return if yellow or green discharge from nose occurs.  Return of Dizziness follow up with Dr. Rosanna Randy or [facility]. If Clinics are closed go to an urgent care or the Emergency Department.    Viral Illness, Adult Viruses are tiny germs that can get into a person's body and cause illness. There are many different types of viruses, and they cause many types of illness. Viral illnesses can range from mild to severe. They can affect various parts of the body. Common illnesses that are caused by a virus include colds and the flu. Viral illnesses also include serious conditions such as HIV/AIDS (human immunodeficiency virus/acquired immunodeficiency syndrome). A few viruses have been linked to certain cancers. What are the causes? Many types of viruses can cause illness. Viruses invade cells in your body, multiply, and cause the infected cells to malfunction or die. When the cell dies, it releases more of the virus. When this happens, you develop symptoms of the illness, and the virus continues to spread to other cells. If the virus takes over the function of the cell, it can cause the cell to divide and grow out of control, as is the case when a virus causes cancer. Different viruses get into the body in different ways. You can get a virus by:  Swallowing food or water that is contaminated with the virus.  Breathing in droplets that have been coughed or sneezed into the air by an infected person.  Touching a surface that has been contaminated with the virus and then touching your eyes, nose, or mouth.  Being bitten by an insect or animal that carries the virus.  Having sexual contact with a person who is infected with the virus.  Being exposed to blood or fluids that contain the virus, either through an open cut or during a transfusion.  If a virus enters your body, your body's defense system (immune system) will try to  fight the virus. You may be at higher risk for a viral illness if your immune system is weak. What are the signs or symptoms? Symptoms vary depending on the type of virus and the location of the cells that it invades. Common symptoms of the main types of viral illnesses include: Cold and flu viruses  Fever.  Headache.  Sore throat.  Muscle aches.  Nasal congestion.  Cough. Digestive system (gastrointestinal) viruses  Fever.  Abdominal pain.  Nausea.  Diarrhea. Liver viruses (hepatitis)  Loss of appetite.  Tiredness.  Yellowing of the skin (jaundice). Brain and spinal cord viruses  Fever.  Headache.  Stiff neck.  Nausea and vomiting.  Confusion or sleepiness. Skin viruses  Warts.  Itching.  Rash. Sexually transmitted viruses  Discharge.  Swelling.  Redness.  Rash. How is this treated? Viruses can be difficult to treat because they live within cells. Antibiotic medicines do not treat viruses because these drugs do not get inside cells. Treatment for a viral illness may include:  Resting and drinking plenty of fluids.  Medicines to relieve symptoms. These can include over-the-counter medicine for pain and fever, medicines for cough or congestion, and medicines to relieve diarrhea.  Antiviral medicines. These drugs are available only for certain types of viruses. They may help reduce flu symptoms if taken early. There are also many antiviral medicines for hepatitis and HIV/AIDS.  Some viral illnesses can be prevented with vaccinations. A common example is the flu shot. Follow  these instructions at home: Medicines   Take over-the-counter and prescription medicines only as told by your health care provider.  If you were prescribed an antiviral medicine, take it as told by your health care provider. Do not stop taking the medicine even if you start to feel better.  Be aware of when antibiotics are needed and when they are not needed. Antibiotics  do not treat viruses. If your health care provider thinks that you may have a bacterial infection as well as a viral infection, you may get an antibiotic. ? Do not ask for an antibiotic prescription if you have been diagnosed with a viral illness. That will not make your illness go away faster. ? Frequently taking antibiotics when they are not needed can lead to antibiotic resistance. When this develops, the medicine no longer works against the bacteria that it normally fights. General instructions  Drink enough fluids to keep your urine clear or pale yellow.  Rest as much as possible.  Return to your normal activities as told by your health care provider. Ask your health care provider what activities are safe for you.  Keep all follow-up visits as told by your health care provider. This is important. How is this prevented? Take these actions to reduce your risk of viral infection:  Eat a healthy diet and get enough rest.  Wash your hands often with soap and water. This is especially important when you are in public places. If soap and water are not available, use hand sanitizer.  Avoid close contact with friends and family who have a viral illness.  If you travel to areas where viral gastrointestinal infection is common, avoid drinking water or eating raw food.  Keep your immunizations up to date. Get a flu shot every year as told by your health care provider.  Do not share toothbrushes, nail clippers, razors, or needles with other people.  Always practice safe sex.  Contact a health care provider if:  You have symptoms of a viral illness that do not go away.  Your symptoms come back after going away.  Your symptoms get worse. Get help right away if:  You have trouble breathing.  You have a severe headache or a stiff neck.  You have severe vomiting or abdominal pain. This information is not intended to replace advice given to you by your health care provider. Make sure you  discuss any questions you have with your health care provider. Document Released: 10/27/2015 Document Revised: 11/29/2015 Document Reviewed: 10/27/2015 Elsevier Interactive Patient Education  2018 [employer]. Eustachian Tube Dysfunction The eustachian tube connects the middle ear to the back of the nose. It regulates air pressure in the middle ear by allowing air to move between the ear and nose. It also helps to drain fluid from the middle ear space. When the eustachian tube does not function properly, air pressure, fluid, or both can build up in the middle ear. Eustachian tube dysfunction can affect one or both ears. What are the causes? This condition happens when the eustachian tube becomes blocked or cannot open normally. This may result from:  Ear infections.  Colds and other upper respiratory infections.  Allergies.  Irritation, such as from cigarette smoke or acid from the stomach coming up into the esophagus (gastroesophageal reflux).  Sudden changes in air pressure, such as from descending in an airplane.  Abnormal growths in the nose or throat, such as nasal polyps, tumors, or enlarged tissue at the back of the throat (adenoids).  What increases the risk? This condition may be more likely to develop in people who smoke and people who are overweight. Eustachian tube dysfunction may also be more likely to develop in children, especially children who have:  Certain birth defects of the mouth, such as cleft palate.  Large tonsils and adenoids.  What are the signs or symptoms? Symptoms of this condition may include:  A feeling of fullness in the ear.  Ear pain.  Clicking or popping noises in the ear.  Ringing in the ear.  Hearing loss.  Loss of balance.  Symptoms may get worse when the air pressure around you changes, such as when you travel to an area of high elevation or fly on an airplane. How is this diagnosed? This condition may be diagnosed based  on:  Your symptoms.  A physical exam of your ear, nose, and throat.  Tests, such as those that measure: ? The movement of your eardrum (tympanogram). ? Your hearing (audiometry).  How is this treated? Treatment depends on the cause and severity of your condition. If your symptoms are mild, you may be able to relieve your symptoms by moving air into ("popping") your ears. If you have symptoms of fluid in your ears, treatment may include:  Decongestants.  Antihistamines.  Nasal sprays or ear drops that contain medicines that reduce swelling (steroids).  In some cases, you may need to have a procedure to drain the fluid in your eardrum (myringotomy). In this procedure, a small tube is placed in the eardrum to:  Drain the fluid.  Restore the air in the middle ear space.  Follow these instructions at home:  Take over-the-counter and prescription medicines only as told by your health care provider.  Use techniques to help pop your ears as recommended by your health care provider. These may include: ? Chewing gum. ? Yawning. ? Frequent, forceful swallowing. ? Closing your mouth, holding your nose closed, and gently blowing as if you are trying to blow air out of your nose.  Do not do any of the following until your health care provider approves: ? Travel to high altitudes. ? Fly in airplanes. ? Work in a Pension scheme manager or room. ? Scuba dive.  Keep your ears dry. Dry your ears completely after showering or bathing.  Do not smoke.  Keep all follow-up visits as told by your health care provider. This is important. Contact a health care provider if:  Your symptoms do not go away after treatment.  Your symptoms come back after treatment.  You are unable to pop your ears.  You have: ? A fever. ? Pain in your ear. ? Pain in your head or neck. ? Fluid draining from your ear.  Your hearing suddenly changes.  You become very dizzy.  You lose your balance. This  information is not intended to replace advice given to you by your health care provider. Make sure you discuss any questions you have with your health care provider. Document Released: 07/14/2015 Document Revised: 11/23/2015 Document Reviewed: 07/06/2014 Elsevier Interactive Patient Education  [employer].

## 2017-04-28 NOTE — Progress Notes (Signed)
Subjective:    Patient ID: Barbara Kaiser, female    DOB: [birth year], 61 y.o.   MRN: [id number]  HPI  Started Saturday with a"little dizziness"  Runny nose , nasal congestion, "felt like I was getting the flu" Felt better on Sunday, no dizziness.  Took Sudafed PE  ( took it at 1 pm)  on Saturday   stayed up to 1 am. And says she was feeling better. Sunday felt better and went to church and attended 2 services.got home about 6pm , no dizziness and took a nap. Woke up at 2 am to use the bathroom went back to bed and woke up about 5 am today.. No headache or dizziness, not feeling bad today.  Wanted sugar checked  Last ate at 12:30pm  CBG 119. Overall feels well now. 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 Did not take Thursday , Friday or Saturday or Sunday. Took one today. Wondered if it was her blood sugar because her grandmother has low blood sugar problems.  Patient answered her phone twice while in clinic , I had to ask her to please get off the phone to continue my history.  History of sinus surgery with spur removal bilateral. History of being premature weight  2 lbs. Says she can take Codeine but needs to eat with it or she gets nausea and vomiting.  Review of Systems  Constitutional: Negative for chills and fever.  HENT: Positive for congestion and tinnitus (on/off ringing). Negative for ear pain, hearing loss, sinus pain, sinus pressure and sore throat.        Objective:   Physical Exam  Constitutional: She is oriented to person, place, and time. She appears well-developed and well-nourished.  HENT:  Head: Normocephalic and atraumatic.  Right Ear: External ear normal. A middle ear effusion is present.  Left Ear: External ear normal. A middle ear effusion is present.  Nose: Nose normal.  Mouth/Throat: Uvula is midline, oropharynx is clear and moist and mucous membranes are normal.  Eyes: Pupils are  equal, round, and reactive to light. Conjunctivae and EOM are normal.  Neck: Normal range of motion. Neck supple.  Cardiovascular: Normal rate, regular rhythm and normal heart sounds.  Exam reveals no gallop and no friction rub.   No murmur heard. Pulmonary/Chest: Effort normal and breath sounds normal.  Musculoskeletal: Normal range of motion.  Neurological: She is alert and oriented to person, place, and time. She has normal strength. No cranial nerve deficit or sensory deficit. She displays a negative Romberg sign. GCS eye subscore is 4. GCS verbal subscore is 5. GCS motor subscore is 6.  Reflex Scores:      Patellar reflexes are 2+ on the right side and 2+ on the left side. Skin: Skin is warm and dry.  Psychiatric: She has a normal mood and affect. Her behavior is normal. Judgment and thought content normal.  Nursing note and vitals reviewed.  Left turbinate swelling clear discharge. No erythema. Equal Grip Upper and lower limb strength 5/5 2+ popliteal reflexes.Finger to nose and heel to toe walk all within normal limits, Negative Rhomberg. .      Assessment & Plan:   Eustachian tube dysfunction Viral illness Stop Sudafed and Slim 180 explained to patient these can affect her blood pressure. If she is unsure of what to take she should contact us or Dr. Rosanna Randy and ask before taking OTC medication. May take plain OTC  Mucinex no D or DM or coricidan hBP take as directed. Return to clinic if dizziness returns or follow up with Dr. Rosanna Randy. Return if yellow or green discharge come out of your nose or if cough occurs. Seek out urgent care or Emergency Department if offices are closed and you are having symptoms. Patient communicates location of an urgent care and the Emergency Department. She verbalizes understanding and has no questions at discharge.  Belinda asked if she could have the flu vaccine, no vaccine for  7-10 days and is well.

## 2017-04-29 ENCOUNTER — Telehealth: Payer: Self-pay | Admitting: Medical

## 2017-04-29 NOTE — Telephone Encounter (Signed)
Called patient to see how she is doing. Feeling better today, no dizziness. Encouraged patient to return to clinic if dizziness reoccurs. She verbalizes understanding and has no questions.  Did ask about the flu vaccine, I told her to wait  7-10 days due to viral illness noted in patients left nostril.

## 2017-05-10 ENCOUNTER — Other Ambulatory Visit: Payer: Self-pay | Admitting: Family Medicine

## 2017-05-10 DIAGNOSIS — I720 Aneurysm of carotid artery: Secondary | ICD-10-CM

## 2017-05-16 ENCOUNTER — Ambulatory Visit: Payer: Self-pay | Admitting: Adult Health

## 2017-05-19 ENCOUNTER — Other Ambulatory Visit: Payer: Self-pay | Admitting: Family Medicine

## 2017-05-19 ENCOUNTER — Other Ambulatory Visit: Payer: Self-pay

## 2017-05-19 NOTE — Telephone Encounter (Signed)
Pt called stated that pharmacy advised they were still waiting on Rx from our office for metoprolol tartrate (LOPRESSOR) 25 MG tablet. I advised we sent that on 05/12/17. I called [facility] and was advised they didn't receive the Rx that was sent in November or August. Pharmacy request we resend Rx or call the Rx in verbally. Please advise. Pt is completely out of medication. Thanks TNP

## 2017-05-19 NOTE — Telephone Encounter (Signed)
Pharmacy requesting refills.

## 2017-05-20 ENCOUNTER — Ambulatory Visit: Payer: Self-pay | Admitting: Medical

## 2017-05-20 DIAGNOSIS — J0100 Acute maxillary sinusitis, unspecified: Secondary | ICD-10-CM

## 2017-05-20 DIAGNOSIS — H6983 Other specified disorders of Eustachian tube, bilateral: Secondary | ICD-10-CM

## 2017-05-20 DIAGNOSIS — H6993 Unspecified Eustachian tube disorder, bilateral: Secondary | ICD-10-CM

## 2017-05-20 DIAGNOSIS — R42 Dizziness and giddiness: Secondary | ICD-10-CM

## 2017-05-20 MED ORDER — AMOXICILLIN-POT CLAVULANATE 875-125 MG PO TABS: 1.0000 | ORAL_TABLET | Freq: Two times a day (BID) | ORAL | 0 refills | Status: DC

## 2017-05-20 MED ORDER — PREDNISONE 10 MG (21) PO TBPK: ORAL_TABLET | ORAL | 0 refills | Status: DC

## 2017-05-20 MED ORDER — MECLIZINE HCL 12.5 MG PO TABS: 12.5000 mg | ORAL_TABLET | Freq: Three times a day (TID) | ORAL | 0 refills | Status: DC | PRN

## 2017-05-20 NOTE — Patient Instructions (Addendum)
Take medications as prescribed.    Return to the clinic in  3-5 days if not improving.   Vertigo Vertigo means that you feel like you are moving when you are not. Vertigo can also make you feel like things around you are moving when they are not. This feeling can come and go at any time. Vertigo often goes away on its own. Follow these instructions at home:  Avoid making fast movements.  Avoid driving.  Avoid using heavy machinery.  Avoid doing any task or activity that might cause danger to you or other people if you would have a vertigo attack while you are doing it.  Sit down right away if you feel dizzy or have trouble with your balance.  Take over-the-counter and prescription medicines only as told by your doctor.  Follow instructions from your doctor about which positions or movements you should avoid.  Drink enough fluid to keep your pee (urine) clear or pale yellow.  Keep all follow-up visits as told by your doctor. This is important. Contact a doctor if:  Medicine does not help your vertigo.  You have a fever.  Your problems get worse or you have new symptoms.  Your family or friends see changes in your behavior.  You feel sick to your stomach (nauseous) or you throw up (vomit).  You have a "pins and needles" feeling or you are numb in part of your body. Get help right away if:  You have trouble moving or talking.  You are always dizzy.  You pass out (faint).  You get very bad headaches.  You feel weak or have trouble using your hands, arms, or legs.  You have changes in your hearing.  You have changes in your seeing (vision).  You get a stiff neck.  Bright light starts to bother you. This information is not intended to replace advice given to you by your health care provider. Make sure you discuss any questions you have with your health care provider. Document Released: 03/26/2008 Document Revised: 11/23/2015 Document Reviewed:  10/10/2014 Elsevier Interactive Patient Education  2018 [employer]. Sinusitis, Adult Sinusitis is soreness and inflammation of your sinuses. Sinuses are hollow spaces in the bones around your face. They are located:  Around your eyes.  In the middle of your forehead.  Behind your nose.  In your cheekbones.  Your sinuses and nasal passages are lined with a stringy fluid (mucus). Mucus normally drains out of your sinuses. When your nasal tissues get inflamed or swollen, the mucus can get trapped or blocked so air cannot flow through your sinuses. This lets bacteria, viruses, and funguses grow, and that leads to infection. Follow these instructions at home: Medicines  Take, use, or apply over-the-counter and prescription medicines only as told by your doctor. These may include nasal sprays.  If you were prescribed an antibiotic medicine, take it as told by your doctor. Do not stop taking the antibiotic even if you start to feel better. Hydrate and Humidify  Drink enough water to keep your pee (urine) clear or pale yellow.  Use a cool mist humidifier to keep the humidity level in your home above 50%.  Breathe in steam for 10-15 minutes, 3-4 times a day or as told by your doctor. You can do this in the bathroom while a hot shower is running.  Try not to spend time in cool or dry air. Rest  Rest as much as possible.  Sleep with your head raised (elevated).  Make  sure to get enough sleep each night. General instructions  Put a warm, moist washcloth on your face 3-4 times a day or as told by your doctor. This will help with discomfort.  Wash your hands often with soap and water. If there is no soap and water, use hand sanitizer.  Do not smoke. Avoid being around people who are smoking (secondhand smoke).  Keep all follow-up visits as told by your doctor. This is important. Contact a doctor if:  You have a fever.  Your symptoms get worse.  Your symptoms do not get better  within 10 days. Get help right away if:  You have a very bad headache.  You cannot stop throwing up (vomiting).  You have pain or swelling around your face or eyes.  You have trouble seeing.  You feel confused.  Your neck is stiff.  You have trouble breathing. This information is not intended to replace advice given to you by your health care provider. Make sure you discuss any questions you have with your health care provider. Document Released: 12/04/2007 Document Revised: 02/11/2016 Document Reviewed: 04/12/2015 Elsevier Interactive Patient Education  2018 [employer]. Eustachian Tube Dysfunction The eustachian tube connects the middle ear to the back of the nose. It regulates air pressure in the middle ear by allowing air to move between the ear and nose. It also helps to drain fluid from the middle ear space. When the eustachian tube does not function properly, air pressure, fluid, or both can build up in the middle ear. Eustachian tube dysfunction can affect one or both ears. What are the causes? This condition happens when the eustachian tube becomes blocked or cannot open normally. This may result from:  Ear infections.  Colds and other upper respiratory infections.  Allergies.  Irritation, such as from cigarette smoke or acid from the stomach coming up into the esophagus (gastroesophageal reflux).  Sudden changes in air pressure, such as from descending in an airplane.  Abnormal growths in the nose or throat, such as nasal polyps, tumors, or enlarged tissue at the back of the throat (adenoids).  What increases the risk? This condition may be more likely to develop in people who smoke and people who are overweight. Eustachian tube dysfunction may also be more likely to develop in children, especially children who have:  Certain birth defects of the mouth, such as cleft palate.  Large tonsils and adenoids.  What are the signs or symptoms? Symptoms of this  condition may include:  A feeling of fullness in the ear.  Ear pain.  Clicking or popping noises in the ear.  Ringing in the ear.  Hearing loss.  Loss of balance.  Symptoms may get worse when the air pressure around you changes, such as when you travel to an area of high elevation or fly on an airplane. How is this diagnosed? This condition may be diagnosed based on:  Your symptoms.  A physical exam of your ear, nose, and throat.  Tests, such as those that measure: ? The movement of your eardrum (tympanogram). ? Your hearing (audiometry).  How is this treated? Treatment depends on the cause and severity of your condition. If your symptoms are mild, you may be able to relieve your symptoms by moving air into ("popping") your ears. If you have symptoms of fluid in your ears, treatment may include:  Decongestants.  Antihistamines.  Nasal sprays or ear drops that contain medicines that reduce swelling (steroids).  In some cases, you may need  to have a procedure to drain the fluid in your eardrum (myringotomy). In this procedure, a small tube is placed in the eardrum to:  Drain the fluid.  Restore the air in the middle ear space.  Follow these instructions at home:  Take over-the-counter and prescription medicines only as told by your health care provider.  Use techniques to help pop your ears as recommended by your health care provider. These may include: ? Chewing gum. ? Yawning. ? Frequent, forceful swallowing. ? Closing your mouth, holding your nose closed, and gently blowing as if you are trying to blow air out of your nose.  Do not do any of the following until your health care provider approves: ? Travel to high altitudes. ? Fly in airplanes. ? Work in a Pension scheme manager or room. ? Scuba dive.  Keep your ears dry. Dry your ears completely after showering or bathing.  Do not smoke.  Keep all follow-up visits as told by your health care provider. This is  important. Contact a health care provider if:  Your symptoms do not go away after treatment.  Your symptoms come back after treatment.  You are unable to pop your ears.  You have: ? A fever. ? Pain in your ear. ? Pain in your head or neck. ? Fluid draining from your ear.  Your hearing suddenly changes.  You become very dizzy.  You lose your balance. This information is not intended to replace advice given to you by your health care provider. Make sure you discuss any questions you have with your health care provider. Document Released: 07/14/2015 Document Revised: 11/23/2015 Document Reviewed: 07/06/2014 Elsevier Interactive Patient Education  [employer].

## 2017-05-20 NOTE — Progress Notes (Signed)
Subjective:    Patient ID: Barbara Kaiser, female    DOB: Dec 06, 1955, 61 y.o.   MRN: [id number]  HPI  No nurse today Vitals : temp te,[  52/5 Hr 73 98% O2 sat 122/86 BP   61 yo female in non acute distress notices while laying down to right side of head down and bending over causes dizziness and sometmes a tad with walking. Previous history of Vertigo.This episode stared 1-2 weeks ago. Both nostril hurt though at different times. Swelling under eyes. Feels like she has a sinus infection.Symptoms x one week. Feels she gets this every fall.denies fever or chills.  Review of Systems  Constitutional: Negative for chills, fatigue and fever.  HENT: Positive for congestion, sinus pressure and sinus pain. Negative for ear pain, nosebleeds, postnasal drip, rhinorrhea, sneezing and sore throat.   Eyes: Negative for discharge and itching.  Respiratory: Negative for cough, chest tightness and shortness of breath.   Cardiovascular: Negative for chest pain, palpitations and leg swelling.  Gastrointestinal: Negative for abdominal pain.  Endocrine: Negative for polydipsia, polyphagia and polyuria.  Genitourinary: Negative for dysuria.  Musculoskeletal: Negative for myalgias.  Skin: Negative for rash.  Allergic/Immunologic: Positive for environmental allergies. Negative for food allergies.  Neurological: Positive for dizziness (lying on the right side of head), light-headedness (with bending over) and headaches. Negative for syncope.       Objective:   Physical Exam  Constitutional: She is oriented to person, place, and time. She appears well-developed and well-nourished.  HENT:  Head: Normocephalic and atraumatic.  Right Ear: Hearing, external ear and ear canal normal. A middle ear effusion is present.  Left Ear: Hearing, external ear and ear canal normal. A middle ear effusion is present.  Nose: Nose normal.  Mouth/Throat: Oropharynx is clear and moist.  Eyes: Conjunctivae and EOM are normal.  Pupils are equal, round, and reactive to light.  Neck: Normal range of motion. Neck supple.  Cardiovascular: Normal rate, regular rhythm and normal heart sounds. Exam reveals no gallop and no friction rub.  No murmur heard. Pulmonary/Chest: Effort normal and breath sounds normal. No respiratory distress. She has no wheezes. She has no rales.  Musculoskeletal: Normal range of motion.  Lymphadenopathy:    She has no cervical adenopathy.  Neurological: She is alert and oriented to person, place, and time. She has normal strength. No cranial nerve deficit or sensory deficit. She displays a negative Romberg sign. GCS eye subscore is 4. GCS verbal subscore is 5. GCS motor subscore is 6.  Skin: Skin is warm and dry.  Psychiatric: She has a normal mood and affect. Her behavior is normal. Judgment and thought content normal.  Vitals reviewed.   Finger to nose and heel to toe wnl.      Assessment & Plan:  Sinusitis maxillary Eustachain tube dysfunction bilaterally Vertigo  Meds ordered this encounter  Medications  . amoxicillin-clavulanate (AUGMENTIN) 875-125 MG tablet    Sig: Take 1 tablet by mouth 2 (two) times daily.    Dispense:  20 tablet    Refill:  0  . predniSONE (STERAPRED UNI-PAK 21 TAB) 10 MG (21) TBPK tablet    Sig: Take 6 tablets by mouth today and then  5 tablets tomorrow, then one less every day thereafter. Take with food.    Dispense:  21 tablet    Refill:  0  . meclizine (ANTIVERT) 12.5 MG tablet    Sig: Take 1 tablet (12.5 mg total) by mouth 3 (three) times daily as  needed for dizziness.    Dispense:  15 tablet    Refill:  0  Return in  3-5  Days if not improving. If your worse over the holiday follow with and urgent care or the Emergency Department.Patient verbalizes understanding and has no questions at discharge

## 2017-05-27 ENCOUNTER — Other Ambulatory Visit: Payer: Self-pay | Admitting: Family Medicine

## 2017-05-27 DIAGNOSIS — Z1231 Encounter for screening mammogram for malignant neoplasm of breast: Secondary | ICD-10-CM

## 2017-06-09 ENCOUNTER — Encounter: Payer: BLUE CROSS/BLUE SHIELD | Admitting: Physician Assistant

## 2017-06-15 ENCOUNTER — Other Ambulatory Visit: Payer: Self-pay | Admitting: Family Medicine

## 2017-06-15 DIAGNOSIS — E7849 Other hyperlipidemia: Secondary | ICD-10-CM

## 2017-06-16 ENCOUNTER — Ambulatory Visit (INDEPENDENT_AMBULATORY_CARE_PROVIDER_SITE_OTHER): Payer: BLUE CROSS/BLUE SHIELD | Admitting: Family Medicine

## 2017-06-16 ENCOUNTER — Other Ambulatory Visit: Payer: Self-pay

## 2017-06-16 VITALS — BP 128/62 | HR 66 | Temp 98.1°F | Resp 16 | Ht 66.0 in | Wt 205.4 lb

## 2017-06-16 DIAGNOSIS — Z124 Encounter for screening for malignant neoplasm of cervix: Secondary | ICD-10-CM | POA: Diagnosis not present

## 2017-06-16 DIAGNOSIS — Z1211 Encounter for screening for malignant neoplasm of colon: Secondary | ICD-10-CM

## 2017-06-16 DIAGNOSIS — R739 Hyperglycemia, unspecified: Secondary | ICD-10-CM

## 2017-06-16 DIAGNOSIS — Z0000 Encounter for general adult medical examination without abnormal findings: Secondary | ICD-10-CM | POA: Diagnosis not present

## 2017-06-16 DIAGNOSIS — Z1159 Encounter for screening for other viral diseases: Secondary | ICD-10-CM

## 2017-06-16 DIAGNOSIS — E7849 Other hyperlipidemia: Secondary | ICD-10-CM

## 2017-06-16 DIAGNOSIS — I10 Essential (primary) hypertension: Secondary | ICD-10-CM | POA: Diagnosis not present

## 2017-06-16 DIAGNOSIS — R87610 Atypical squamous cells of undetermined significance on cytologic smear of cervix (ASC-US): Secondary | ICD-10-CM | POA: Diagnosis not present

## 2017-06-16 LAB — IFOBT (OCCULT BLOOD): IFOBT: NEGATIVE

## 2017-06-16 NOTE — Patient Instructions (Signed)

## 2017-06-16 NOTE — Progress Notes (Signed)
Patient: Barbara Kaiser, Female    DOB: [birth year], 61 y.o.   MRN: [id number] Visit Date: 06/16/2017  Today's Provider: [name], MD   Chief Complaint  Patient presents with  . Annual Exam   Subjective:  Barbara Kaiser is a 61 y.o. female who presents today for health maintenance and complete physical. She feels fairly well. She reports exercising walking at work but no other routine with exercise. She reports she is sleeping fairly well.  Last mammogram was 06/18/16 negative, has one scheduled for 06/18/17. Colonoscopy 01/28/14 Dr Gustavo Lah, diverticulosis, tubular adenoma and hyperplastic polyps, repeat in 2020. Pap smear 01/11/15 normal by Dr Glori Bickers but not going there anymore. S/P hysterectomy-still has cervix, this was done due to fibroid tumors, heavy bleeding.  Routine lab work was done on 08/09/16.  Review of Systems  Constitutional: Negative.   HENT: Positive for sinus pressure and tinnitus.   Eyes: Negative.   Respiratory: Negative.   Gastrointestinal: Positive for constipation.  Endocrine: Negative.   Genitourinary: Negative.   Musculoskeletal: Positive for arthralgias.  Skin: Negative.   Allergic/Immunologic: Negative.   Neurological: Negative.   Hematological: Negative.   Psychiatric/Behavioral: Negative.     Social History   Socioeconomic History  . Marital status: Single    Spouse name: Not on file  . Number of children: 2  . Years of education: Not on file  . Highest education level: Not on file  Social Needs  . Financial resource strain: Not on file  . Food insecurity - worry: Not on file  . Food insecurity - inability: Not on file  . Transportation needs - medical: Not on file  . Transportation needs - non-medical: Not on file  Occupational History    Employer: [employer]  Tobacco Use  . Smoking status: Former Smoker    Packs/day: 0.25    Years: 24.00    Pack years: 6.00    Last attempt to quit: 07/01/2001    Years since quitting: 15.9   . Smokeless tobacco: Never Used  Substance and Sexual Activity  . Alcohol use: No    Alcohol/week: 0.0 oz  . Drug use: No  . Sexual activity: Not Currently    Partners: Male    Birth control/protection: Surgical    Comment: tubalization.  Other Topics Concern  . Not on file  Social History Narrative   Treadmill for exercise    Patient Active Problem List   Diagnosis Date Noted  . Impacted cerumen 02/12/2017  . Encounter for routine gynecological examination 01/11/2015  . Acute sinusitis 07/26/2014  . Benign paroxysmal positional vertigo 07/26/2014  . Personal history of colonic polyps 12/06/2013  . Obesity 12/06/2013  . Routine general medical examination at a health care facility 08/16/2012  . CAD (coronary artery disease) 12/05/2011  . Preoperative cardiovascular examination 12/05/2011  . Hyperglycemia 08/20/2011  . Vitamin D deficiency 08/20/2011  . Hyperlipidemia 06/21/2009  . HYPERTENSION, MILD 02/13/2007  . ALLERGIC RHINITIS 02/13/2007  . ASTHMA 02/13/2007    Past Surgical History:  Procedure Laterality Date  . ABDOMINAL HYSTERECTOMY    . CARDIAC CATHETERIZATION  05/2005   mild CAD  . DILATION AND CURETTAGE OF UTERUS  12/08   hysteroscopy and endometrial ablation  . LAPAROSCOPIC SUPRACERVICAL HYSTERECTOMY  12/09/2011   Procedure: LAPAROSCOPIC SUPRACERVICAL HYSTERECTOMY;  Surgeon: Osborne Oman, MD;  Location: [facility];  Service: Gynecology;  Laterality: N/A;  . NASAL SINUS SURGERY  11/07  . SALPINGOOPHORECTOMY  12/09/2011   Procedure: SALPINGO OOPHERECTOMY;  Surgeon: Laray Anger  A Anyanwu, MD;  Location: [facility];  Service: Gynecology;  Laterality: Bilateral;  . SUPRACERVICAL ABDOMINAL HYSTERECTOMY  12/09/2011   Procedure: HYSTERECTOMY SUPRACERVICAL ABDOMINAL;  Surgeon: Osborne Oman, MD;  Location: [facility];  Service: Gynecology;  Laterality: N/A;  . TUBAL LIGATION      Her family history includes Cancer in her other; Cancer (age of onset: 38) in her mother;  Diabetes in her other; Heart disease in her brother and other; Hypertension in her father and mother; Stroke in her other.     Outpatient Encounter Medications as of 06/16/2017  Medication Sig  . aspirin 81 MG tablet Take 81 mg by mouth as needed.  . fluticasone (FLONASE) 50 MCG/ACT nasal spray INHALE 2 SPRAYS IN EACH NOSTRIL ONCE DAILY AS NEEDED  . meclizine (ANTIVERT) 12.5 MG tablet Take 1 tablet (12.5 mg total) by mouth 3 (three) times daily as needed for dizziness.  . metoprolol tartrate (LOPRESSOR) 25 MG tablet TAKE 1 TABLET BY MOUTH TWICE A DAY  . Multiple Vitamin (MULTIVITAMIN) tablet Take 1 tablet by mouth daily.  . simvastatin (ZOCOR) 20 MG tablet TAKE 1 TABLET (20 MG TOTAL) BY MOUTH DAILY.  . [DISCONTINUED] amoxicillin-clavulanate (AUGMENTIN) 875-125 MG tablet Take 1 tablet by mouth 2 (two) times daily.  . [DISCONTINUED] predniSONE (STERAPRED UNI-PAK 21 TAB) 10 MG (21) TBPK tablet Take 6 tablets by mouth today and then  5 tablets tomorrow, then one less every day thereafter. Take with food.   No facility-administered encounter medications on file as of 06/16/2017.     Patient Care Team: Jerrol Banana., MD as PCP - General (Family Medicine)      Objective:   Vitals:  Vitals:   06/16/17 1425  BP: 128/62  Pulse: 66  Resp: 16  Temp: 98.1 F (36.7 C)  Weight: 205 lb 6.4 oz (93.2 kg)  Height: 5\' 6"  (1.676 m)    Physical Exam  Constitutional: She is oriented to person, place, and time. She appears well-developed and well-nourished.  HENT:  Head: Normocephalic and atraumatic.  Right Ear: External ear normal.  Left Ear: External ear normal.  Nose: Nose normal.  Mouth/Throat: Oropharynx is clear and moist.  Eyes: Conjunctivae are normal. Pupils are equal, round, and reactive to light.  Neck: Normal range of motion. Neck supple. No tracheal deviation present. No thyromegaly present.  Cardiovascular: Normal rate, regular rhythm, normal heart sounds and intact distal  pulses. Exam reveals no gallop.  No murmur heard. Pulmonary/Chest: Effort normal and breath sounds normal. No respiratory distress. She has no wheezes. Right breast exhibits no inverted nipple, no mass and no nipple discharge. Left breast exhibits no inverted nipple, no mass and no nipple discharge.  Breast exam normal.  Abdominal: Soft. She exhibits no distension. There is no tenderness.  Genitourinary: Rectum normal and vagina normal. Rectal exam shows guaiac negative stool. No breast swelling, discharge or bleeding. There is no rash or lesion on the right labia. There is no rash or lesion on the left labia. No bleeding in the vagina.  Genitourinary Comments: Mild cystocele present. Patient still has cervix, no uterus or ovaries present. Bimanual exam is normal today.  Musculoskeletal: She exhibits no edema or tenderness.  Neurological: She is alert and oriented to person, place, and time.  Skin: Skin is warm and dry. No rash noted. No erythema.  Psychiatric: She has a normal mood and affect. Her behavior is normal. Judgment and thought content normal.   Depression Screen PHQ 2/9 Scores 06/16/2017 06/06/2016  12/06/2013 08/19/2012  PHQ - 2 Score 0 0 0 0  PHQ- 9 Score 0 - - -   Assessment & Plan:   1. Annual physical exam - CBC with Differential/Platelet - Comprehensive metabolic panel - TSH - Lipid Profile  2. Colon cancer screening - IFOBT POC (occult bld, rslt in office); Future - IFOBT POC (occult bld, rslt in office)  3. Other hyperlipidemia - Lipid Profile  4. HYPERTENSION, MILD - CBC with Differential/Platelet - Comprehensive metabolic panel  5. Cervical cancer screening - Pap IG and HPV (high risk) DNA detection  6. Need for hepatitis C screening test - Hepatitis C Antibody  7. Hyperglycemia - HgB A1c  HPI, Exam and A&P transcribed by Tiffany Kocher, RMA under direction and in the presence of Miguel Aschoff, MD. I have done the exam and reviewed the chart and it  is accurate to the best of my knowledge. Development worker, community has been used and  any errors in dictation or transcription are unintentional. Miguel Aschoff M.D. [facility]

## 2017-06-18 ENCOUNTER — Ambulatory Visit
Admission: RE | Admit: 2017-06-18 | Discharge: 2017-06-18 | Disposition: A | Discharge status: Home / Self Care | Payer: BLUE CROSS/BLUE SHIELD | Source: Ambulatory Visit | Attending: Family Medicine | Admitting: Family Medicine

## 2017-06-18 DIAGNOSIS — Z1231 Encounter for screening mammogram for malignant neoplasm of breast: Secondary | ICD-10-CM | POA: Diagnosis not present

## 2017-06-18 LAB — PAP IG AND HPV HIGH-RISK: HPV DNA HIGH RISK: NOT DETECTED

## 2017-06-25 ENCOUNTER — Telehealth: Payer: Self-pay

## 2017-06-25 DIAGNOSIS — R87619 Unspecified abnormal cytological findings in specimens from cervix uteri: Secondary | ICD-10-CM

## 2017-06-25 NOTE — Telephone Encounter (Signed)
-----   Message from Mar Daring, [place] sent at 06/19/2017  1:18 PM EST ----- Pap was abnormal with atypical cells but is HPV negative. Recommendations per ACOG are to repeat in 1 year. If patient desires, however, she can be referred to GYN for further evaluation.

## 2017-06-25 NOTE — Telephone Encounter (Signed)
Pt advised today. Pt wants to go ahead and see a GYN, I placed the order for [facility], RMA

## 2017-06-25 NOTE — Telephone Encounter (Signed)
thanks

## 2017-06-26 ENCOUNTER — Telehealth: Payer: Self-pay | Admitting: Obstetrics and Gynecology

## 2017-06-26 NOTE — Telephone Encounter (Signed)
[facility] referring for Atypical endocervical cells on cervical Papanicolaou smear. Called and left voicemail for patient to call back to be schedule

## 2017-06-26 NOTE — Telephone Encounter (Signed)
Pt is schedule 07/16/16

## 2017-07-16 ENCOUNTER — Ambulatory Visit: Payer: Self-pay | Admitting: Obstetrics and Gynecology

## 2017-07-16 ENCOUNTER — Ambulatory Visit: Payer: BLUE CROSS/BLUE SHIELD | Admitting: Obstetrics and Gynecology

## 2017-07-16 ENCOUNTER — Encounter: Payer: Self-pay | Admitting: Obstetrics and Gynecology

## 2017-07-16 VITALS — BP 128/84 | Ht 66.0 in | Wt 204.0 lb

## 2017-07-16 DIAGNOSIS — R87610 Atypical squamous cells of undetermined significance on cytologic smear of cervix (ASC-US): Secondary | ICD-10-CM | POA: Diagnosis not present

## 2017-07-16 NOTE — Progress Notes (Signed)
Obstetrics & Gynecology Office Visit   Chief Complaint: Referral from Dr. Miguel Aschoff for abnormal pap smear  History of Present Illness: 62 y.o. G2P2 female seen in referral from Dr. Rosanna Randy for an ASC-US pap smear that had a negative HPV result.  The patient presents to discuss the significance and follow up on these results.  She states that she has never had an abnormal pap smear. She states she had a hysterectomy about 5-6 years ago, but this was supracervical.  She has no issues with bleeding. Denies abnormal discharge and any other symptoms.   Past Medical History:  Diagnosis Date  . Allergic rhinitis   . Anemia, iron deficiency   . Asthma   . Colon polyps 3/10  . Complication of anesthesia    hard to wake  . Uterine fibroid    with heavy menses and anemia (resolved by ablation)    Past Surgical History:  Procedure Laterality Date  . ABDOMINAL HYSTERECTOMY    . CARDIAC CATHETERIZATION  05/2005   mild CAD  . DILATION AND CURETTAGE OF UTERUS  12/08   hysteroscopy and endometrial ablation  . LAPAROSCOPIC SUPRACERVICAL HYSTERECTOMY  12/09/2011   Procedure: LAPAROSCOPIC SUPRACERVICAL HYSTERECTOMY;  Surgeon: Osborne Oman, MD;  Location: [facility];  Service: Gynecology;  Laterality: N/A;  . NASAL SINUS SURGERY  11/07  . SALPINGOOPHORECTOMY  12/09/2011   Procedure: SALPINGO OOPHERECTOMY;  Surgeon: Osborne Oman, MD;  Location: [facility];  Service: Gynecology;  Laterality: Bilateral;  . SUPRACERVICAL ABDOMINAL HYSTERECTOMY  12/09/2011   Procedure: HYSTERECTOMY SUPRACERVICAL ABDOMINAL;  Surgeon: Osborne Oman, MD;  Location: [facility];  Service: Gynecology;  Laterality: N/A;  . TUBAL LIGATION      Gynecologic History: Patient's last menstrual period was 11/28/2011.  Obstetric History: G2P2  Family History  Problem Relation Age of Onset  . Hypertension Mother   . Cancer Mother 20       uterine  . Hypertension Father   . Heart disease Other        MI  . Diabetes Other     . Stroke Other   . Heart disease Brother        age 99 and 34  . Cancer Other        lung  . Breast cancer Neg Hx     Social History   Socioeconomic History  . Marital status: Single    Spouse name: Not on file  . Number of children: 2  . Years of education: Not on file  . Highest education level: Not on file  Social Needs  . Financial resource strain: Not on file  . Food insecurity - worry: Not on file  . Food insecurity - inability: Not on file  . Transportation needs - medical: Not on file  . Transportation needs - non-medical: Not on file  Occupational History    Employer: [employer]  Tobacco Use  . Smoking status: Former Smoker    Packs/day: 0.25    Years: 24.00    Pack years: 6.00    Last attempt to quit: 07/01/2001    Years since quitting: 16.0  . Smokeless tobacco: Never Used  Substance and Sexual Activity  . Alcohol use: No    Alcohol/week: 0.0 oz  . Drug use: No  . Sexual activity: Not Currently    Partners: Male    Birth control/protection: Surgical    Comment: tubalization.  Other Topics Concern  . Not on file  Social History Narrative  Treadmill for exercise    Allergies  Allergen Reactions  . Codeine Nausea And Vomiting    Medications   Medication Sig Start Date End Date Taking? Authorizing Provider  aspirin 81 MG tablet Take 81 mg by mouth as needed.    [provider]  fluticasone (FLONASE) 50 MCG/ACT nasal spray INHALE 2 SPRAYS IN EACH NOSTRIL ONCE DAILY AS NEEDED 11/26/16   Volney American, PA-C  meclizine (ANTIVERT) 12.5 MG tablet Take 1 tablet (12.5 mg total) by mouth 3 (three) times daily as needed for dizziness. 05/20/17   Ratcliffe, Heather R, PA-C  metoprolol tartrate (LOPRESSOR) 25 MG tablet TAKE 1 TABLET BY MOUTH TWICE A DAY 05/19/17   Jerrol Banana., MD  Multiple Vitamin (MULTIVITAMIN) tablet Take 1 tablet by mouth daily.    [provider]  simvastatin (ZOCOR) 20 MG tablet TAKE 1 TABLET (20 MG  TOTAL) BY MOUTH DAILY. 06/16/17   Jerrol Banana., MD    Review of Systems  Constitutional: Negative.   HENT: Negative.   Eyes: Negative.   Respiratory: Negative.   Cardiovascular: Negative.   Gastrointestinal: Negative.   Genitourinary: Negative.   Musculoskeletal: Negative.   Skin: Negative.   Neurological: Negative.   Psychiatric/Behavioral: Negative.      Physical Exam BP 128/84   Ht 5\' 6"  (1.676 m)   Wt 204 lb (92.5 kg)   LMP 11/28/2011   BMI 32.93 kg/m  Patient's last menstrual period was 11/28/2011. Physical Exam  Constitutional: She is oriented to person, place, and time. She appears well-developed and well-nourished. No distress.  HENT:  Head: Normocephalic and atraumatic.  Neurological: She is alert and oriented to person, place, and time. No cranial nerve deficit.  Psychiatric: She has a normal mood and affect. Her behavior is normal. Judgment normal.   Assessment: 62 y.o. G2P2 female here for  1. ASCUS of cervix with negative high risk HPV      Plan: Problem List Items Addressed This Visit    None    Visit Diagnoses    ASCUS of cervix with negative high risk HPV    -  Primary     Discussed pap smears and the basis behind checking them.  Discussed the significance of an ASC-US pap smear and how  Testing for HPV adds to that diagnosis.  Discussed that she does not have one of the high-risk HPV types tested for in the pap smear.  Her actual pap smear results are atypical, but could be for a number of reasons.   The recommend follow up from this result by the [facility] is to repeat the pap smear in 3 years with HPV testing.   She may elect an earlier pap smear, if desired.    20 minutes spent in face to face discussion with > 50% spent in counseling, management, and coordination of care of her abnormal pap smear.    Prentice Docker, MD 07/16/2017 2:35 PM     CC: Jerrol Banana., MD [address] [place], [place] [address]

## 2017-07-18 ENCOUNTER — Other Ambulatory Visit: Payer: Self-pay

## 2017-07-23 ENCOUNTER — Other Ambulatory Visit: Payer: BLUE CROSS/BLUE SHIELD

## 2017-07-23 DIAGNOSIS — Z0000 Encounter for general adult medical examination without abnormal findings: Secondary | ICD-10-CM

## 2017-07-24 ENCOUNTER — Telehealth: Payer: Self-pay

## 2017-07-24 LAB — CBC WITH DIFFERENTIAL/PLATELET
Basophils Absolute: 0 10*3/uL (ref 0.0–0.2)
Basos: 0 %
EOS (ABSOLUTE): 0.2 10*3/uL (ref 0.0–0.4)
Eos: 4 %
HEMOGLOBIN: 13.6 g/dL (ref 11.1–15.9)
Hematocrit: 42.4 % (ref 34.0–46.6)
Immature Grans (Abs): 0 10*3/uL (ref 0.0–0.1)
Immature Granulocytes: 0 %
LYMPHS ABS: 1.9 10*3/uL (ref 0.7–3.1)
LYMPHS: 34 %
MCH: 27.9 pg (ref 26.6–33.0)
MCHC: 32.1 g/dL (ref 31.5–35.7)
MCV: 87 fL (ref 79–97)
MONOCYTES: 4 %
Monocytes Absolute: 0.2 10*3/uL (ref 0.1–0.9)
Neutrophils Absolute: 3.2 10*3/uL (ref 1.4–7.0)
Neutrophils: 58 %
PLATELETS: 266 10*3/uL (ref 150–379)
RBC: 4.88 x10E6/uL (ref 3.77–5.28)
RDW: 13.5 % (ref 12.3–15.4)
WBC: 5.7 10*3/uL (ref 3.4–10.8)

## 2017-07-24 LAB — COMPREHENSIVE METABOLIC PANEL
A/G RATIO: 1.8 (ref 1.2–2.2)
ALT: 15 IU/L (ref 0–32)
AST: 16 IU/L (ref 0–40)
Albumin: 4.4 g/dL (ref 3.5–5.5)
Alkaline Phosphatase: 94 IU/L (ref 39–117)
BUN / CREAT RATIO: 23 (ref 9–23)
BUN: 18 mg/dL (ref 6–24)
Bilirubin Total: 0.4 mg/dL (ref 0.0–1.2)
CO2: 21 mmol/L (ref 20–29)
Calcium: 9.6 mg/dL (ref 8.7–10.2)
Chloride: 104 mmol/L (ref 96–106)
Creatinine, Ser: 0.8 mg/dL (ref 0.57–1.00)
GFR, EST AFRICAN AMERICAN: 93 mL/min/{1.73_m2} (ref >59)
GFR, EST NON AFRICAN AMERICAN: 81 mL/min/{1.73_m2} (ref >59)
GLOBULIN, TOTAL: 2.5 g/dL (ref 1.5–4.5)
GLUCOSE: 119 mg/dL — AB (ref 65–99)
Potassium: 4.1 mmol/L (ref 3.5–5.2)
SODIUM: 141 mmol/L (ref 134–144)
TOTAL PROTEIN: 6.9 g/dL (ref 6.0–8.5)

## 2017-07-24 LAB — LIPID PANEL
CHOL/HDL RATIO: 4.7 ratio — AB (ref 0.0–4.4)
Cholesterol, Total: 168 mg/dL (ref 100–199)
HDL: 36 mg/dL — ABNORMAL LOW (ref >39)
LDL Calculated: 114 mg/dL — ABNORMAL HIGH (ref 0–99)
Triglycerides: 90 mg/dL (ref 0–149)
VLDL Cholesterol Cal: 18 mg/dL (ref 5–40)

## 2017-07-24 LAB — TSH: TSH: 2.14 u[IU]/mL (ref 0.450–4.500)

## 2017-07-24 LAB — HGB A1C W/O EAG: Hgb A1c MFr Bld: 5.8 % — ABNORMAL HIGH (ref 4.8–5.6)

## 2017-07-24 LAB — HEPATITIS C ANTIBODY: Hep C Virus Ab: <0.1 s/co ratio (ref 0.0–0.9)

## 2017-07-24 NOTE — Telephone Encounter (Signed)
-----   Message from Jerrol Banana., MD sent at 07/24/2017  8:51 AM EST ----- Stable.

## 2017-07-24 NOTE — Telephone Encounter (Signed)
lmtcb-Anastasiya V Hopkins, RMA  

## 2017-07-28 NOTE — Telephone Encounter (Signed)
Pt advised-Barbara Kaiser, RMA  

## 2018-01-26 ENCOUNTER — Ambulatory Visit: Payer: BLUE CROSS/BLUE SHIELD | Admitting: Medical

## 2018-01-26 ENCOUNTER — Encounter: Payer: Self-pay | Admitting: Medical

## 2018-01-26 VITALS — BP 125/64 | HR 65 | Temp 97.5°F | Resp 18 | Wt 202.0 lb

## 2018-01-26 DIAGNOSIS — M549 Dorsalgia, unspecified: Secondary | ICD-10-CM

## 2018-01-26 DIAGNOSIS — R3129 Other microscopic hematuria: Secondary | ICD-10-CM

## 2018-01-26 DIAGNOSIS — M62838 Other muscle spasm: Secondary | ICD-10-CM

## 2018-01-26 LAB — POCT URINALYSIS DIPSTICK
Bilirubin, UA: NEGATIVE
GLUCOSE UA: NEGATIVE
Ketones, UA: NEGATIVE
LEUKOCYTES UA: NEGATIVE
Nitrite, UA: NEGATIVE
Protein, UA: NEGATIVE
Spec Grav, UA: >=1.03 — AB (ref 1.010–1.025)
Urobilinogen, UA: 0.2 E.U./dL
pH, UA: 5 (ref 5.0–8.0)

## 2018-01-26 MED ORDER — CYCLOBENZAPRINE HCL 5 MG PO TABS: 5.0000 mg | ORAL_TABLET | Freq: Three times a day (TID) | ORAL | 0 refills | Status: DC | PRN

## 2018-01-26 MED ORDER — IBUPROFEN 800 MG PO TABS: 800.0000 mg | ORAL_TABLET | Freq: Three times a day (TID) | ORAL | 0 refills | Status: DC | PRN

## 2018-01-26 NOTE — Progress Notes (Signed)
   Subjective:    Patient ID: Barbara Kaiser, female    DOB: [birth year], 62 y.o.   MRN: [id number]  HPI 62 yo female in non acute distress.  Presents to day with complaints of  mild  right sided back pain x 2 weeks. Painfull on movement. Recently had to move heavy furntiure. " if it is to heavy I use my back to push the furniture." No radiation down arm or leg. Was taking Ibuprofen 800mg   Just in the mornging,  Helped some.  Took nothing today for pain. Denies any urinary problems or trouble breathing.Denies fever or chills.  Blood pressure 125/64, pulse 65, temperature (!) 97.5 F (36.4 C), temperature source Tympanic, resp. rate 18, weight 202 lb (91.6 kg), last menstrual period 11/28/2011, SpO2 97 %. Review of Systems  Constitutional: Negative for chills and fever.  Respiratory: Negative for cough and shortness of breath.   Cardiovascular: Negative for chest pain.  Gastrointestinal: Negative for abdominal pain.  Genitourinary: Positive for vaginal pain. Negative for dysuria, frequency and urgency.  Musculoskeletal: Positive for back pain.  Skin: Negative for rash.  Neurological: Negative for dizziness, syncope and light-headedness.  Psychiatric/Behavioral: Negative for behavioral problems, self-injury and suicidal ideas.   Pain with trunk with rotation to the right , none with rotationg to the left.    Objective:   Physical Exam  Constitutional: She is oriented to person, place, and time. She appears well-developed and well-nourished.  HENT:  Head: Normocephalic and atraumatic.  Eyes: Pupils are equal, round, and reactive to light. Conjunctivae and EOM are normal.  Cardiovascular: Normal rate, regular rhythm and normal heart sounds.  Pulmonary/Chest: Effort normal and breath sounds normal.  Musculoskeletal:       Arms: Decreased range of motion when twisting trunk to the right.  Neurological: She is alert and oriented to person, place, and time.  Psychiatric: She has a normal mood  and affect. Her behavior is normal. Judgment and thought content normal.  Nursing note and vitals reviewed.  Skin wnl.  No CVA tenderness, positive back spasm noted on right mid back. 2+ reflexes popliteal. No vertebral tenderness. 5/5 upper and lower extremity strength. Neg SLR bilaterally. .   urine dip very trace blood , no leuks no nitrates. Assessment & Plan:  Mid back pain right side,hematuria Exercises for rehab given to paitient. Warm compresses to the area. Declines light duty note says she is just cleaning her rooms not moving anymore furniture.. Return in one week for recheck of urine . Follow up with your OB/GYN for vaginal examination. Patient verbalizes understanding and has no questions at discharge.

## 2018-01-26 NOTE — Patient Instructions (Signed)
Muscle Cramps and Spasms Muscle cramps and spasms are when muscles tighten by themselves. They usually get better within minutes. Muscle cramps are painful. They are usually stronger and last longer than muscle spasms. Muscle spasms may or may not be painful. They can last a few seconds or much longer. Follow these instructions at home:  Drink enough fluid to keep your pee (urine) clear or pale yellow.  Massage, stretch, and relax the muscle.  If directed, apply heat to tight or tense muscles as often as told by your doctor. Use the heat source that your doctor recommends. ? Place a towel between your skin and the heat source. ? Leave the heat on for 20-30 minutes. ? Take off the heat if your skin turns bright red. This is especially important if you are unable to feel pain, heat, or cold. You may have a greater risk of getting burned.  If directed, put ice on the affected area. This may help if you are sore or have pain after a cramp or spasm. ? Put ice in a plastic bag. ? Place a towel between your skin and the bag. ? Leave the ice on for 20 minutes, 2-3 times a day.  Take over-the-counter and prescription medicines only as told by your doctor.  Pay attention to any changes in your symptoms. Contact a doctor if:  Your cramps or spasms get worse or happen more often.  Your cramps or spasms do not get better with time. This information is not intended to replace advice given to you by your health care provider. Make sure you discuss any questions you have with your health care provider. Document Released: 05/30/2008 Document Revised: 07/19/2015 Document Reviewed: 03/21/2015 Elsevier Interactive Patient Education  2018 [employer]. Mid-Back Strain Rehab Ask your health care provider which exercises are safe for you. Do exercises exactly as told by your health care provider and adjust them as directed. It is normal to feel mild stretching, pulling, tightness, or discomfort as you do  these exercises, but you should stop right away if you feel sudden pain or your pain gets worse. Do not begin these exercises until told by your health care provider. Stretching and range of motion exercises This exercise warms up your muscles and joints and improves the movement and flexibility of your back and shoulders. This exercise also help to relieve pain. Exercise A: Chest and spine stretch  1. Lie down on your back on a firm surface. 2. Roll a towel or a small blanket so it is about 4 inches (10 cm) in diameter. 3. Put the towel lengthwise under the middle of your back so it is under your spine, but not under your shoulder blades. 4. To increase the stretch, you may put your hands behind your head and let your elbows fall to your sides. 5. Hold for __________ seconds. Repeat exercise __________ times. Complete this exercise __________ times a day. Strengthening exercises These exercises build strength and endurance in your back and your shoulder blade muscles. Endurance is the ability to use your muscles for a long time, even after they get tired. Exercise B: Alternating arm and leg raises  1. Get on your hands and knees on a firm surface. If you are on a hard floor, you may want to use padding to cushion your knees, such as an exercise mat. 2. Line up your arms and legs. Your hands should be below your shoulders, and your knees should be below your hips. 3. Lift your  left leg behind you. At the same time, raise your right arm and straighten it in front of you. ? Do not lift your leg higher than your hip. ? Do not lift your arm higher than your shoulder. ? Keep your abdominal and back muscles tight. ? Keep your hips facing the ground. ? Do not arch your back. ? Keep your balance carefully, and do not hold your breath. 4. Hold for __________ seconds. 5. Slowly return to the starting position and repeat with your right leg and your left arm. Repeat __________ times. Complete this  exercise __________ times a day. Exercise C: Straight arm rows ( shoulder extension) 1. Stand with your feet shoulder width apart. 2. Secure an exercise band to a stable object in front of you so the band is at or above shoulder height. 3. Hold one end of the exercise band in each hand. 4. Straighten your elbows and lift your hands up to shoulder height. 5. Step back, away from the secured end of the exercise band, until the band stretches. 6. Squeeze your shoulder blades together and pull your hands down to the sides of your thighs. Stop when your hands are straight down by your sides. Do not let your hands go behind your body. 7. Hold for __________ seconds. 8. Slowly return to the starting position. Repeat __________ times. Complete this exercise __________ times a day. Exercise D: Shoulder external rotation, prone 1. Lie on your abdomen on a firm bed so your left / right forearm hangs over the edge of the bed and your upper arm is on the bed, straight out from your body. ? Your elbow should be bent. ? Your palm should be facing your feet. 2. If instructed, hold a __________ weight in your hand. 3. Squeeze your shoulder blade toward the middle of your back. Do not let your shoulder lift toward your ear. 4. Keep your elbow bent in an "L" shape (90 degrees) while you slowly move your forearm up toward the ceiling. Move your forearm up to the height of the bed, toward your head. ? Your upper arm should not move. ? At the top of the movement, your palm should face the floor. 5. Hold for __________ seconds. 6. Slowly return to the starting position and relax your muscles. Repeat __________ times. Complete this exercise __________ times a day. Exercise E: Scapular retraction and external rotation, rowing  1. Sit in a stable chair without armrests, or stand. 2. Secure an exercise band to a stable object in front of you so it is at shoulder height. 3. Hold one end of the exercise band in each  hand. 4. Bring your arms out straight in front of you. 5. Step back, away from the secured end of the exercise band, until the band stretches. 6. Pull the band backward. As you do this, bend your elbows and squeeze your shoulder blades together, but avoid letting the rest of your body move. Do not let your shoulders lift up toward your ears. 7. Stop when your elbows are at your sides or slightly behind your body. 8. Hold for __________ seconds. 9. Slowly straighten your arms to return to the starting position. Repeat __________ times. Complete this exercise __________ times a day. Posture and body mechanics  Body mechanics refers to the movements and positions of your body while you do your daily activities. Posture is part of body mechanics. Good posture and healthy body mechanics can help to relieve stress in your body's tissues  and joints. Good posture means that your spine is in its natural S-curve position (your spine is neutral), your shoulders are pulled back slightly, and your head is not tipped forward. The following are general guidelines for applying improved posture and body mechanics to your everyday activities. Standing   When standing, keep your spine neutral and your feet about hip-width apart. Keep a slight bend in your knees. Your ears, shoulders, and hips should line up.  When you do a task in which you lean forward while standing in one place for a long time, place one foot up on a stable object that is 2-4 inches (5-10 cm) high, such as a footstool. This helps keep your spine neutral. Sitting   When sitting, keep your spine neutral and keep your feet flat on the floor. Use a footrest, if necessary, and keep your thighs parallel to the floor. Avoid rounding your shoulders, and avoid tilting your head forward.  When working at a desk or a computer, keep your desk at a height where your hands are slightly lower than your elbows. Slide your chair under your desk so you are  close enough to maintain good posture.  When working at a computer, place your monitor at a height where you are looking straight ahead and you do not have to tilt your head forward or downward to look at the screen. Resting  When lying down and resting, avoid positions that are most painful for you.  If you have pain with activities such as sitting, bending, stooping, or squatting (flexion-based activities), lie in a position in which your body does not bend very much. For example, avoid curling up on your side with your arms and knees near your chest (fetal position).  If you have pain with activities such as standing for a long time or reaching with your arms (extension-based activities), lie with your spine in a neutral position and bend your knees slightly. Try the following positions:  Lying on your side with a pillow between your knees.  Lying on your back with a pillow under your knees.  Lifting   When lifting objects, keep your feet at least shoulder-width apart and tighten your abdominal muscles.  Bend your knees and hips and keep your spine neutral. It is important to lift using the strength of your legs, not your back. Do not lock your knees straight out.  Always ask for help to lift heavy or awkward objects. This information is not intended to replace advice given to you by your health care provider. Make sure you discuss any questions you have with your health care provider. Document Released: 06/17/2005 Document Revised: 02/22/2016 Document Reviewed: 03/29/2015 Elsevier Interactive Patient Education  [employer].

## 2018-02-02 ENCOUNTER — Ambulatory Visit: Payer: BLUE CROSS/BLUE SHIELD | Admitting: Medical

## 2018-02-03 ENCOUNTER — Encounter: Payer: Self-pay | Admitting: Medical

## 2018-02-03 ENCOUNTER — Ambulatory Visit: Payer: BLUE CROSS/BLUE SHIELD | Admitting: Medical

## 2018-02-03 VITALS — BP 130/68 | HR 66 | Temp 97.4°F | Resp 18 | Wt 200.4 lb

## 2018-02-03 DIAGNOSIS — R319 Hematuria, unspecified: Secondary | ICD-10-CM

## 2018-02-03 DIAGNOSIS — R8290 Unspecified abnormal findings in urine: Secondary | ICD-10-CM

## 2018-02-03 LAB — POCT URINALYSIS DIPSTICK
Glucose, UA: NEGATIVE
Ketones, UA: NEGATIVE
Leukocytes, UA: NEGATIVE
NITRITE UA: NEGATIVE
PH UA: 6 (ref 5.0–8.0)
Protein, UA: NEGATIVE
RBC UA: NEGATIVE
UROBILINOGEN UA: 0.2 U/dL

## 2018-02-03 NOTE — Progress Notes (Signed)
   Subjective:    Patient ID: Barbara Kaiser, female    DOB: December 21, 1955, 62 y.o.   MRN: [id number]  HPI  62 yo in non acute distress. Back pain resolved on Friday.  Here for follow up uriine. Did not see ob/gyn , vaginal pain now resolved, "I had a little boil it got better on its own. " Eating a lot of watermelon, eating watermelon one weekly.   I take a lot of Ibuprofen, Advil taking  8 per day for about 3 months for her Back pain. I am supposed to be getting an injection. "they think I have a slipped disc, Last year got it done at [facility]. Review of Systems  Constitutional: Negative for chills and fever.  HENT: Positive for sinus pressure. Negative for congestion and rhinorrhea.   Respiratory: Positive for cough. Negative for shortness of breath.   Cardiovascular: Negative for chest pain.  Gastrointestinal: Negative for abdominal pain.  Genitourinary: Negative for dysuria, frequency, hematuria, urgency and vaginal pain.  Musculoskeletal: Negative for back pain.  Skin: Negative for rash.  Neurological: Negative for syncope and light-headedness.  Psychiatric/Behavioral: Negative for behavioral problems, self-injury and suicidal ideas.       Objective:   Physical Exam  Constitutional: She is oriented to person, place, and time. She appears well-developed and well-nourished.  HENT:  Head: Normocephalic and atraumatic.  Right Ear: External ear normal. A middle ear effusion is present.  Left Ear: A middle ear effusion is present.  Nose: Rhinorrhea (left side) present.  Mouth/Throat: Oropharynx is clear and moist.  Eyes: Pupils are equal, round, and reactive to light. Conjunctivae, EOM and lids are normal.  Neck: Normal range of motion. Neck supple.  Cardiovascular: Normal rate, regular rhythm and normal heart sounds.  Pulmonary/Chest: Effort normal and breath sounds normal.  Abdominal: Soft. Bowel sounds are normal. She exhibits no distension and no mass. There is no tenderness. There  is no rebound and no guarding. No hernia.  Musculoskeletal: Normal range of motion.  Neurological: She is alert and oriented to person, place, and time.  Skin: Skin is warm and dry.  Psychiatric: She has a normal mood and affect. Her behavior is normal. Judgment and thought content normal.  Nursing note and vitals reviewed.  Urine dip   3+ bilirubin, trace protein  Assessment & Plan:  Bilirubin in urine, proteinuria Patient window down and she heard the rain, her window is down. So she left to go close the window. Reviewed patient with Dr. Rosanna Randy, he would like a Met C and a repeat urine in 2-4 weeks. (3 weeks) Will contact patient if abnormal lab. Patient verbalizes understanding and has no questions at discharge.  Labs all within normal limits.

## 2018-02-04 LAB — SPECIMEN STATUS

## 2018-02-04 LAB — COMPREHENSIVE METABOLIC PANEL
A/G RATIO: 1.6 (ref 1.2–2.2)
ALT: 20 IU/L (ref 0–32)
AST: 19 IU/L (ref 0–40)
Albumin: 4.4 g/dL (ref 3.5–5.5)
Alkaline Phosphatase: 90 IU/L (ref 39–117)
BILIRUBIN TOTAL: 0.3 mg/dL (ref 0.0–1.2)
BUN/Creatinine Ratio: 20 (ref 9–23)
BUN: 16 mg/dL (ref 6–24)
CALCIUM: 9.6 mg/dL (ref 8.7–10.2)
CO2: 22 mmol/L (ref 20–29)
Chloride: 106 mmol/L (ref 96–106)
Creatinine, Ser: 0.81 mg/dL (ref 0.57–1.00)
GFR, EST AFRICAN AMERICAN: 92 mL/min/{1.73_m2} (ref >59)
GFR, EST NON AFRICAN AMERICAN: 80 mL/min/{1.73_m2} (ref >59)
GLOBULIN, TOTAL: 2.7 g/dL (ref 1.5–4.5)
Glucose: 94 mg/dL (ref 65–99)
POTASSIUM: 4.1 mmol/L (ref 3.5–5.2)
SODIUM: 144 mmol/L (ref 134–144)
TOTAL PROTEIN: 7.1 g/dL (ref 6.0–8.5)

## 2018-04-10 ENCOUNTER — Other Ambulatory Visit: Payer: Self-pay | Admitting: Family Medicine

## 2018-04-10 DIAGNOSIS — E7849 Other hyperlipidemia: Secondary | ICD-10-CM

## 2018-05-13 ENCOUNTER — Other Ambulatory Visit: Payer: Self-pay | Admitting: Family Medicine

## 2018-05-13 DIAGNOSIS — Z1231 Encounter for screening mammogram for malignant neoplasm of breast: Secondary | ICD-10-CM

## 2018-06-19 ENCOUNTER — Ambulatory Visit
Admission: RE | Admit: 2018-06-19 | Discharge: 2018-06-19 | Disposition: A | Discharge status: Home / Self Care | Payer: BLUE CROSS/BLUE SHIELD | Source: Ambulatory Visit | Attending: Family Medicine | Admitting: Family Medicine

## 2018-06-19 DIAGNOSIS — Z1231 Encounter for screening mammogram for malignant neoplasm of breast: Secondary | ICD-10-CM | POA: Diagnosis not present

## 2018-06-22 ENCOUNTER — Encounter: Payer: BLUE CROSS/BLUE SHIELD | Admitting: Family Medicine

## 2018-06-22 NOTE — Progress Notes (Deleted)
Patient: Barbara Kaiser, Female    DOB: [birth year], 62 y.o.   MRN: [id number] Visit Date: 06/22/2018  Today's Provider: [name], MD   No chief complaint on file.  Subjective:    Annual physical exam Barbara Kaiser is a 62 y.o. female who presents today for health maintenance and complete physical. She feels {DESC; WELL/FAIRLY WELL/POORLY:18703}. She reports exercising ***. She reports she is sleeping {DESC; WELL/FAIRLY WELL/POORLY:18703}.  Colonoscopy- 01/28/2014. Dr. Gustavo Lah. Diverticulosis and hyperplastic polyps. Repeat 12/2018. Mammogram- 06/19/2018. Normal. Repeat 1 year. Pap- 06/16/2017. Abnormal. Referred to GYN. Recommendations were to repeat in 3 years, earlier if desired.  S/P hysterectomy-patient still has cervix. This was done due to fibroid tumors and heavy bleeding.    Review of Systems  Social History      She  reports that she quit smoking about 16 years ago. She has a 6.00 pack-year smoking history. She has never used smokeless tobacco. She reports that she does not drink alcohol or use drugs.       Social History   Socioeconomic History  . Marital status: Single    Spouse name: Not on file  . Number of children: 2  . Years of education: Not on file  . Highest education level: Not on file  Occupational History    Employer: [employer]  . Financial resource strain: Not on file  . Food insecurity:    Worry: Not on file    Inability: Not on file  . Transportation needs:    Medical: Not on file    Non-medical: Not on file  Tobacco Use  . Smoking status: Former Smoker    Packs/day: 0.25    Years: 24.00    Pack years: 6.00    Last attempt to quit: 07/01/2001    Years since quitting: 16.9  . Smokeless tobacco: Never Used  Substance and Sexual Activity  . Alcohol use: No    Alcohol/week: 0.0 standard drinks  . Drug use: No  . Sexual activity: Not Currently    Partners: Male    Birth control/protection: Surgical   Comment: tubalization.  Lifestyle  . Physical activity:    Days per week: Not on file    Minutes per session: Not on file  . Stress: Not on file  Relationships  . Social connections:    Talks on phone: Not on file    Gets together: Not on file    Attends religious service: Not on file    Active member of club or organization: Not on file    Attends meetings of clubs or organizations: Not on file    Relationship status: Not on file  Other Topics Concern  . Not on file  Social History Narrative   Treadmill for exercise    Past Medical History:  Diagnosis Date  . Allergic rhinitis   . Anemia, iron deficiency   . Asthma   . Colon polyps 3/10  . Complication of anesthesia    hard to wake  . Uterine fibroid    with heavy menses and anemia (resolved by ablation)     Patient Active Problem List   Diagnosis Date Noted  . Impacted cerumen 02/12/2017  . Encounter for routine gynecological examination 01/11/2015  . Acute sinusitis 07/26/2014  . Benign paroxysmal positional vertigo 07/26/2014  . Personal history of colonic polyps 12/06/2013  . Obesity 12/06/2013  . Routine general medical examination at a health care facility 08/16/2012  . CAD (  coronary artery disease) 12/05/2011  . Preoperative cardiovascular examination 12/05/2011  . Hyperglycemia 08/20/2011  . Vitamin D deficiency 08/20/2011  . Hyperlipidemia 06/21/2009  . HYPERTENSION, MILD 02/13/2007  . ALLERGIC RHINITIS 02/13/2007  . ASTHMA 02/13/2007    Past Surgical History:  Procedure Laterality Date  . ABDOMINAL HYSTERECTOMY    . CARDIAC CATHETERIZATION  05/2005   mild CAD  . DILATION AND CURETTAGE OF UTERUS  12/08   hysteroscopy and endometrial ablation  . LAPAROSCOPIC SUPRACERVICAL HYSTERECTOMY  12/09/2011   Procedure: LAPAROSCOPIC SUPRACERVICAL HYSTERECTOMY;  Surgeon: Osborne Oman, MD;  Location: [facility];  Service: Gynecology;  Laterality: N/A;  . NASAL SINUS SURGERY  11/07  . SALPINGOOPHORECTOMY   12/09/2011   Procedure: SALPINGO OOPHERECTOMY;  Surgeon: Osborne Oman, MD;  Location: [facility];  Service: Gynecology;  Laterality: Bilateral;  . SUPRACERVICAL ABDOMINAL HYSTERECTOMY  12/09/2011   Procedure: HYSTERECTOMY SUPRACERVICAL ABDOMINAL;  Surgeon: Osborne Oman, MD;  Location: [facility];  Service: Gynecology;  Laterality: N/A;  . TUBAL LIGATION      Family History        Family Status  Relation Name Status  . Mother  Alive  . Father  Deceased       died of cirrhosis  . Other Grandmother Alive  . Brother  Alive  . Other Grandfather Alive  . Neg Hx  (Not Specified)        Her family history includes Cancer in an other family member; Cancer (age of onset: 14) in her mother; Diabetes in an other family member; Heart disease in her brother and another family member; Hypertension in her father and mother; Stroke in an other family member. There is no history of Breast cancer.      Allergies  Allergen Reactions  . Codeine Nausea And Vomiting     Current Outpatient Medications:  .  aspirin 81 MG tablet, Take 81 mg by mouth as needed., Disp: , Rfl:  .  cyclobenzaprine (FLEXERIL) 5 MG tablet, Take 1 tablet (5 mg total) by mouth 3 (three) times daily as needed for muscle spasms., Disp: 20 tablet, Rfl: 0 .  fluticasone (FLONASE) 50 MCG/ACT nasal spray, INHALE 2 SPRAYS IN EACH NOSTRIL ONCE DAILY AS NEEDED, Disp: 16 g, Rfl: 11 .  ibuprofen (ADVIL,MOTRIN) 800 MG tablet, Take 1 tablet (800 mg total) by mouth every 8 (eight) hours as needed., Disp: 30 tablet, Rfl: 0 .  meclizine (ANTIVERT) 12.5 MG tablet, Take 1 tablet (12.5 mg total) by mouth 3 (three) times daily as needed for dizziness., Disp: 15 tablet, Rfl: 0 .  metoprolol tartrate (LOPRESSOR) 25 MG tablet, TAKE 1 TABLET BY MOUTH TWICE A DAY, Disp: 60 tablet, Rfl: 12 .  Multiple Vitamin (MULTIVITAMIN) tablet, Take 1 tablet by mouth daily., Disp: , Rfl:  .  simvastatin (ZOCOR) 20 MG tablet, TAKE 1 TABLET (20 MG TOTAL) BY MOUTH DAILY.,  Disp: 90 tablet, Rfl: 2   Patient Care Team: Jerrol Banana., MD as PCP - General (Family Medicine)      Objective:   Vitals: LMP 11/28/2011   There were no vitals filed for this visit.   Physical Exam   Depression Screen PHQ 2/9 Scores 06/16/2017 06/06/2016 12/06/2013 08/19/2012  PHQ - 2 Score 0 0 0 0  PHQ- 9 Score 0 - - -      Assessment & Plan:     Routine Health Maintenance and Physical Exam  Exercise Activities and Dietary recommendations Goals   None  Immunization History  Administered Date(s) Administered  . Influenza-Unspecified 03/31/2013, 03/31/2014  . Td 03/21/2006    Health Maintenance  Topic Date Due  . TETANUS/TDAP  03/21/2016  . INFLUENZA VACCINE  01/29/2018  . COLONOSCOPY  01/29/2019  . MAMMOGRAM  06/20/2019  . PAP SMEAR-Modifier  06/16/2020  . Hepatitis C Screening  Completed  . HIV Screening  Completed     Discussed health benefits of physical activity, and encouraged her to engage in regular exercise appropriate for her age and condition.    Richard Cranford Mon, MD  [facility]

## 2018-06-30 ENCOUNTER — Encounter: Payer: BLUE CROSS/BLUE SHIELD | Admitting: Family Medicine

## 2018-07-22 ENCOUNTER — Other Ambulatory Visit: Payer: Self-pay | Admitting: Family Medicine

## 2018-08-04 ENCOUNTER — Ambulatory Visit (INDEPENDENT_AMBULATORY_CARE_PROVIDER_SITE_OTHER): Payer: BLUE CROSS/BLUE SHIELD | Admitting: Family Medicine

## 2018-08-04 ENCOUNTER — Encounter: Payer: Self-pay | Admitting: Family Medicine

## 2018-08-04 VITALS — BP 138/78 | HR 56 | Temp 98.5°F | Ht 66.0 in | Wt 202.2 lb

## 2018-08-04 DIAGNOSIS — E559 Vitamin D deficiency, unspecified: Secondary | ICD-10-CM | POA: Diagnosis not present

## 2018-08-04 DIAGNOSIS — Z0000 Encounter for general adult medical examination without abnormal findings: Secondary | ICD-10-CM

## 2018-08-04 DIAGNOSIS — J329 Chronic sinusitis, unspecified: Secondary | ICD-10-CM | POA: Diagnosis not present

## 2018-08-04 DIAGNOSIS — I10 Essential (primary) hypertension: Secondary | ICD-10-CM | POA: Diagnosis not present

## 2018-08-04 MED ORDER — AMOXICILLIN 500 MG PO CAPS: 500.0000 mg | ORAL_CAPSULE | Freq: Three times a day (TID) | ORAL | 0 refills | Status: DC

## 2018-08-04 NOTE — Progress Notes (Signed)
Patient: Barbara Kaiser, Female    DOB: 1956-07-01, 63 y.o.   MRN: [id number] Visit Date: 08/04/2018  Today's Provider: [name], MD   Chief Complaint  Patient presents with  . Annual Exam  . Sinusitis   Subjective:     Annual physical exam Barbara Kaiser is a 63 y.o. female who presents today for health maintenance and complete physical. She feels fairly well. She is experiencing sinus issues that started Sunday. She is experiencing sinus pressure, nose pain, and headache. She reports exercising lightly. She reports she is sleeping well. Patient status post hysterectomy in 2005 for fibroids.  ----------------------------------------------------------------- Last Mammogram: 06/19/2018 Last Colonoscopy: 01/28/2014 Last pap: 06/16/2017  Review of Systems  Constitutional: Negative.   HENT: Positive for sinus pressure and sinus pain.        States she has had 2 days of swelling in the right maxillary area and odor in  nasal passages.  Eyes: Negative.   Respiratory: Negative.   Cardiovascular: Negative.   Gastrointestinal: Negative.   Endocrine: Negative.   Genitourinary: Negative.   Musculoskeletal: Positive for back pain.  Skin: Negative.   Allergic/Immunologic: Negative.   Neurological: Positive for headaches.  Hematological: Negative.   Psychiatric/Behavioral: Negative.     Social History      She  reports that she quit smoking about 17 years ago. She has a 6.00 pack-year smoking history. She has never used smokeless tobacco. She reports that she does not drink alcohol or use drugs.       Social History   Socioeconomic History  . Marital status: Single    Spouse name: Not on file  . Number of children: 2  . Years of education: Not on file  . Highest education level: Not on file  Occupational History    Employer: [employer]  . Financial resource strain: Not on file  . Food insecurity:    Worry: Not on file    Inability: Not on  file  . Transportation needs:    Medical: Not on file    Non-medical: Not on file  Tobacco Use  . Smoking status: Former Smoker    Packs/day: 0.25    Years: 24.00    Pack years: 6.00    Last attempt to quit: 07/01/2001    Years since quitting: 17.1  . Smokeless tobacco: Never Used  Substance and Sexual Activity  . Alcohol use: No    Alcohol/week: 0.0 standard drinks  . Drug use: No  . Sexual activity: Not Currently    Partners: Male    Birth control/protection: Surgical    Comment: tubalization.  Lifestyle  . Physical activity:    Days per week: Not on file    Minutes per session: Not on file  . Stress: Not on file  Relationships  . Social connections:    Talks on phone: Not on file    Gets together: Not on file    Attends religious service: Not on file    Active member of club or organization: Not on file    Attends meetings of clubs or organizations: Not on file    Relationship status: Not on file  Other Topics Concern  . Not on file  Social History Narrative   Treadmill for exercise    Past Medical History:  Diagnosis Date  . Allergic rhinitis   . Anemia, iron deficiency   . Asthma   . Colon polyps 3/10  . Complication of anesthesia  hard to wake  . Uterine fibroid    with heavy menses and anemia (resolved by ablation)     Patient Active Problem List   Diagnosis Date Noted  . Impacted cerumen 02/12/2017  . Encounter for routine gynecological examination 01/11/2015  . Acute sinusitis 07/26/2014  . Benign paroxysmal positional vertigo 07/26/2014  . Personal history of colonic polyps 12/06/2013  . Obesity 12/06/2013  . Routine general medical examination at a health care facility 08/16/2012  . CAD (coronary artery disease) 12/05/2011  . Preoperative cardiovascular examination 12/05/2011  . Hyperglycemia 08/20/2011  . Vitamin D deficiency 08/20/2011  . Hyperlipidemia 06/21/2009  . HYPERTENSION, MILD 02/13/2007  . ALLERGIC RHINITIS 02/13/2007  .  ASTHMA 02/13/2007    Past Surgical History:  Procedure Laterality Date  . ABDOMINAL HYSTERECTOMY    . CARDIAC CATHETERIZATION  05/2005   mild CAD  . DILATION AND CURETTAGE OF UTERUS  12/08   hysteroscopy and endometrial ablation  . LAPAROSCOPIC SUPRACERVICAL HYSTERECTOMY  12/09/2011   Procedure: LAPAROSCOPIC SUPRACERVICAL HYSTERECTOMY;  Surgeon: Osborne Oman, MD;  Location: [facility];  Service: Gynecology;  Laterality: N/A;  . NASAL SINUS SURGERY  11/07  . SALPINGOOPHORECTOMY  12/09/2011   Procedure: SALPINGO OOPHERECTOMY;  Surgeon: Osborne Oman, MD;  Location: [facility];  Service: Gynecology;  Laterality: Bilateral;  . SUPRACERVICAL ABDOMINAL HYSTERECTOMY  12/09/2011   Procedure: HYSTERECTOMY SUPRACERVICAL ABDOMINAL;  Surgeon: Osborne Oman, MD;  Location: [facility];  Service: Gynecology;  Laterality: N/A;  . TUBAL LIGATION      Family History        Family Status  Relation Name Status  . Mother  Alive  . Father  Deceased       died of cirrhosis  . Other Grandmother Alive  . Brother  Alive  . Other Grandfather Alive  . Neg Hx  (Not Specified)        Her family history includes Cancer in an other family member; Cancer (age of onset: 12) in her mother; Diabetes in an other family member; Heart disease in her brother and another family member; Hypertension in her father and mother; Stroke in an other family member. There is no history of Breast cancer.      Allergies  Allergen Reactions  . Codeine Nausea And Vomiting     Current Outpatient Medications:  .  aspirin 81 MG tablet, Take 81 mg by mouth as needed., Disp: , Rfl:  .  cyclobenzaprine (FLEXERIL) 5 MG tablet, Take 1 tablet (5 mg total) by mouth 3 (three) times daily as needed for muscle spasms., Disp: 20 tablet, Rfl: 0 .  fluticasone (FLONASE) 50 MCG/ACT nasal spray, INHALE 2 SPRAYS IN EACH NOSTRIL ONCE DAILY AS NEEDED, Disp: 16 g, Rfl: 11 .  ibuprofen (ADVIL,MOTRIN) 800 MG tablet, Take 1 tablet (800 mg total) by mouth  every 8 (eight) hours as needed., Disp: 30 tablet, Rfl: 0 .  meclizine (ANTIVERT) 12.5 MG tablet, Take 1 tablet (12.5 mg total) by mouth 3 (three) times daily as needed for dizziness., Disp: 15 tablet, Rfl: 0 .  metoprolol tartrate (LOPRESSOR) 25 MG tablet, TAKE 1 TABLET BY MOUTH TWICE A DAY, Disp: 60 tablet, Rfl: 0 .  Multiple Vitamin (MULTIVITAMIN) tablet, Take 1 tablet by mouth daily., Disp: , Rfl:  .  simvastatin (ZOCOR) 20 MG tablet, TAKE 1 TABLET (20 MG TOTAL) BY MOUTH DAILY., Disp: 90 tablet, Rfl: 2   Patient Care Team: Jerrol Banana., MD as PCP - General (Family Medicine)  Objective:    Vitals: BP 138/78 (BP Location: Right Arm, Patient Position: Sitting, Cuff Size: Normal)   Pulse (!) 56   Temp 98.5 F (36.9 C) (Oral)   Ht 5\' 6"  (1.676 m)   Wt 202 lb 3.2 oz (91.7 kg)   LMP 11/28/2011   SpO2 98%   BMI 32.64 kg/m    Vitals:   08/04/18 0945  BP: 138/78  Pulse: (!) 56  Temp: 98.5 F (36.9 C)  TempSrc: Oral  SpO2: 98%  Weight: 202 lb 3.2 oz (91.7 kg)  Height: 5\' 6"  (1.676 m)     Physical Exam Constitutional:      Appearance: Normal appearance. She is well-developed.  HENT:     Head: Normocephalic and atraumatic.     Comments: He has upper dentures and a partial lower denture.    Right Ear: External ear normal.     Left Ear: External ear normal.     Nose: Nose normal.  Eyes:     General: No scleral icterus.    Conjunctiva/sclera: Conjunctivae normal.     Pupils: Pupils are equal, round, and reactive to light.  Neck:     Musculoskeletal: Normal range of motion and neck supple.     Thyroid: No thyromegaly.     Trachea: No tracheal deviation.     Comments: Chronic pulsatile area  in the right lower neck. Cardiovascular:     Rate and Rhythm: Normal rate and regular rhythm.     Heart sounds: Normal heart sounds. No murmur. No gallop.   Pulmonary:     Effort: Pulmonary effort is normal. No respiratory distress.     Breath sounds: Normal breath sounds.  No wheezing.  Chest:     Breasts:        Right: No inverted nipple, mass or nipple discharge.        Left: No inverted nipple, mass or nipple discharge.  Abdominal:     General: There is no distension.     Palpations: Abdomen is soft.     Tenderness: There is no abdominal tenderness.  Genitourinary:    Labia:        Right: No rash or lesion.        Left: No rash or lesion.      Vagina: Normal. No bleeding.     Rectum: Normal. Guaiac result negative.     Comments: Mild cystocele present. Patient still has cervix, no uterus or ovaries present. Bimanual exam is normal today. Musculoskeletal:        General: No tenderness.  Skin:    General: Skin is warm and dry.     Findings: No erythema or rash.  Neurological:     General: No focal deficit present.     Mental Status: She is alert and oriented to person, place, and time. Mental status is at baseline.  Psychiatric:        Mood and Affect: Mood normal.        Behavior: Behavior normal.        Thought Content: Thought content normal.        Judgment: Judgment normal.      Depression Screen PHQ 2/9 Scores 08/04/2018 06/16/2017 06/06/2016 12/06/2013  PHQ - 2 Score 0 0 0 0  PHQ- 9 Score 0 0 - -       Assessment & Plan:     Routine Health Maintenance and Physical Exam  Exercise Activities and Dietary recommendations Goals   None  Immunization History  Administered Date(s) Administered  . Influenza-Unspecified 03/31/2013, 03/31/2014  . Td 03/21/2006    Health Maintenance  Topic Date Due  . TETANUS/TDAP  03/21/2016  . INFLUENZA VACCINE  01/29/2018  . COLONOSCOPY  01/29/2019  . MAMMOGRAM  06/20/2019  . PAP SMEAR-Modifier  06/16/2020  . Hepatitis C Screening  Completed  . HIV Screening  Completed     Discussed health benefits of physical activity, and encouraged her to engage in regular exercise appropriate for her age and condition.  1. Annual physical exam  - Lipid Profile - CBC with Differential - TSH -  Comprehensive Metabolic Panel (CMET) - Ambulatory referral to Gastroenterology  2. Sinusitis, unspecified chronicity, unspecified location Maxillary sinusitis. - amoxicillin (AMOXIL) 500 MG capsule; Take 1 capsule (500 mg total) by mouth 3 (three) times daily.  Dispense: 21 capsule; Refill: 0  3. HYPERTENSION, MILD   4. Vitamin D deficiency     --------------------------------------------------------------------   I have done the exam and reviewed the above chart and it is accurate to the best of my knowledge. Development worker, community has been used in this note in any air is in the dictation or transcription are unintentional.  [name], MD  [place]

## 2018-08-24 ENCOUNTER — Other Ambulatory Visit: Payer: Self-pay

## 2018-08-24 DIAGNOSIS — Z0000 Encounter for general adult medical examination without abnormal findings: Secondary | ICD-10-CM

## 2018-08-25 ENCOUNTER — Other Ambulatory Visit: Payer: BLUE CROSS/BLUE SHIELD

## 2018-08-26 ENCOUNTER — Other Ambulatory Visit: Payer: BLUE CROSS/BLUE SHIELD

## 2018-08-26 ENCOUNTER — Other Ambulatory Visit: Payer: Self-pay | Admitting: Family Medicine

## 2018-08-26 DIAGNOSIS — Z0000 Encounter for general adult medical examination without abnormal findings: Secondary | ICD-10-CM

## 2018-08-26 NOTE — Telephone Encounter (Signed)
Pharmacy requesting refills. Thanks!  

## 2018-08-27 ENCOUNTER — Ambulatory Visit: Payer: BLUE CROSS/BLUE SHIELD | Admitting: Nurse Practitioner

## 2018-08-27 ENCOUNTER — Other Ambulatory Visit: Payer: Self-pay

## 2018-08-27 ENCOUNTER — Encounter: Payer: Self-pay | Admitting: Nurse Practitioner

## 2018-08-27 VITALS — BP 166/73 | HR 80 | Temp 97.9°F | Resp 18 | Ht 66.0 in | Wt 201.0 lb

## 2018-08-27 DIAGNOSIS — B9789 Other viral agents as the cause of diseases classified elsewhere: Secondary | ICD-10-CM

## 2018-08-27 DIAGNOSIS — J019 Acute sinusitis, unspecified: Principal | ICD-10-CM

## 2018-08-27 DIAGNOSIS — J302 Other seasonal allergic rhinitis: Secondary | ICD-10-CM

## 2018-08-27 LAB — COMPREHENSIVE METABOLIC PANEL
ALT: 16 IU/L (ref 0–32)
AST: 12 IU/L (ref 0–40)
Albumin/Globulin Ratio: 1.7 (ref 1.2–2.2)
Albumin: 4.2 g/dL (ref 3.8–4.9)
Alkaline Phosphatase: 95 IU/L (ref 39–117)
BILIRUBIN TOTAL: 0.4 mg/dL (ref 0.0–1.2)
BUN/Creatinine Ratio: 19 (ref 12–28)
BUN: 14 mg/dL (ref 8–27)
CHLORIDE: 108 mmol/L — AB (ref 96–106)
CO2: 20 mmol/L (ref 20–29)
Calcium: 9.1 mg/dL (ref 8.7–10.3)
Creatinine, Ser: 0.72 mg/dL (ref 0.57–1.00)
GFR calc Af Amer: 105 mL/min/{1.73_m2} (ref >59)
GFR calc non Af Amer: 91 mL/min/{1.73_m2} (ref >59)
GLUCOSE: 103 mg/dL — AB (ref 65–99)
Globulin, Total: 2.5 g/dL (ref 1.5–4.5)
Potassium: 4.3 mmol/L (ref 3.5–5.2)
Sodium: 143 mmol/L (ref 134–144)
Total Protein: 6.7 g/dL (ref 6.0–8.5)

## 2018-08-27 LAB — LIPID PANEL
CHOLESTEROL TOTAL: 163 mg/dL (ref 100–199)
Chol/HDL Ratio: 4.3 ratio (ref 0.0–4.4)
HDL: 38 mg/dL — AB (ref >39)
LDL Calculated: 102 mg/dL — ABNORMAL HIGH (ref 0–99)
Triglycerides: 116 mg/dL (ref 0–149)
VLDL Cholesterol Cal: 23 mg/dL (ref 5–40)

## 2018-08-27 LAB — CBC WITH DIFFERENTIAL/PLATELET
Basophils Absolute: 0 10*3/uL (ref 0.0–0.2)
Basos: 1 %
EOS (ABSOLUTE): 0.3 10*3/uL (ref 0.0–0.4)
Eos: 5 %
Hematocrit: 41.6 % (ref 34.0–46.6)
Hemoglobin: 13.9 g/dL (ref 11.1–15.9)
IMMATURE GRANULOCYTES: 0 %
Immature Grans (Abs): 0 10*3/uL (ref 0.0–0.1)
Lymphocytes Absolute: 2.1 10*3/uL (ref 0.7–3.1)
Lymphs: 32 %
MCH: 28.8 pg (ref 26.6–33.0)
MCHC: 33.4 g/dL (ref 31.5–35.7)
MCV: 86 fL (ref 79–97)
Monocytes Absolute: 0.4 10*3/uL (ref 0.1–0.9)
Monocytes: 6 %
Neutrophils Absolute: 3.7 10*3/uL (ref 1.4–7.0)
Neutrophils: 56 %
Platelets: 215 10*3/uL (ref 150–450)
RBC: 4.83 x10E6/uL (ref 3.77–5.28)
RDW: 12.4 % (ref 11.7–15.4)
WBC: 6.5 10*3/uL (ref 3.4–10.8)

## 2018-08-27 LAB — TSH: TSH: 2.18 u[IU]/mL (ref 0.450–4.500)

## 2018-08-27 MED ORDER — FEXOFENADINE HCL 180 MG PO TABS: 180.0000 mg | ORAL_TABLET | Freq: Every day | ORAL | 0 refills | Status: DC

## 2018-08-27 MED ORDER — AZELASTINE HCL 0.1 % NA SOLN: NASAL | 12 refills | Status: DC

## 2018-08-27 NOTE — Progress Notes (Addendum)
   Subjective:    Patient ID: Barbara Kaiser, female    DOB: 08/17/1955, 63 y.o.   MRN: [id number]  HPI Barbara Kaiser comes in the [facility] wellness [facility] today with complaints of sinus congestion runny nose sore throat nonproductive dry cough and sneezing x4 days. Reports "GI upset" which she thinks is due to drainage.  She denies fever facial pain ear pain shortness of breath or wheezing.  Denies any self treatment with the exception of taking routine saline spray and Flonase directed by primary care provider. Blood pressure up in the office today and reports she forgot to take her medication this morning but she usually takes it every day twice daily as directed by scribing provider.    Review of Systems  HENT: Positive for congestion, postnasal drip, rhinorrhea and sore throat. Negative for ear pain, sinus pressure and sinus pain.   Respiratory: Positive for cough. Negative for wheezing.   Cardiovascular: Negative for chest pain.       Objective:   Physical Exam Vitals signs reviewed.  Constitutional:      Appearance: Normal appearance. She is well-developed.  HENT:     Head: Normocephalic and atraumatic.     Comments: No maxillary or frontal sinus tenderness    Right Ear: Ear canal normal.     Left Ear: Ear canal normal.     Ears:     Comments: Intact TM bilaterally with no erythema and clear serous fluid bilaterally    Nose: Congestion present.     Comments: Audible congestion with sniffing. Inferior turbinates erythematous, boggy bilaterally with thick light yellow discharge to left turbinate.    Mouth/Throat:     Mouth: Mucous membranes are moist.  Neck:     Musculoskeletal: Normal range of motion and neck supple.  Cardiovascular:     Rate and Rhythm: Normal rate and regular rhythm.     Heart sounds: Murmur present.     Comments: Grade 2 murmur heard at right aortic second intercostal space midclavicular line during exam Pulmonary:     Effort: Pulmonary effort is  normal. No respiratory distress.     Breath sounds: Normal breath sounds.  Abdominal:     General: Bowel sounds are normal.     Palpations: Abdomen is soft.     Tenderness: There is no abdominal tenderness.  Musculoskeletal: Normal range of motion.  Lymphadenopathy:     Cervical: No cervical adenopathy.  Skin:    General: Skin is warm and dry.  Neurological:     Mental Status: She is alert and oriented to person, place, and time.  Psychiatric:        Mood and Affect: Mood normal.           Assessment & Plan:

## 2018-08-27 NOTE — Patient Instructions (Signed)
Please flush sinuses with normal saline then follow by Flonase twice daily; use new nasal spay in the afternoon Take all meds as directed Plenty of fluids and rest encouraged Remember to take blood pressure medicine today as primary provider instructed  Encouraged patient to call the office or primary care doctor for an appointment if no improvement in symptoms or if symptoms change or worsen after 72 hours of planned treatment. Patient verbalized understanding of all instructions given/reviewed and has no further questions or concerns at this time.

## 2018-08-28 DIAGNOSIS — Z86010 Personal history of colonic polyps: Secondary | ICD-10-CM | POA: Diagnosis not present

## 2018-08-31 ENCOUNTER — Other Ambulatory Visit: Payer: Self-pay

## 2018-08-31 MED ORDER — FLUTICASONE PROPIONATE 50 MCG/ACT NA SUSP: NASAL | 11 refills | Status: DC

## 2018-08-31 NOTE — Telephone Encounter (Signed)
Pt advised.  She states she needs a refill on Flonase sent to [facility].   Thanks,    -Mickel Baas

## 2018-08-31 NOTE — Telephone Encounter (Signed)
-----   Message from Jerrol Banana., MD sent at 08/30/2018  2:04 PM EST ----- Labs normal.

## 2018-09-16 ENCOUNTER — Other Ambulatory Visit: Payer: Self-pay | Admitting: Nurse Practitioner

## 2018-09-16 DIAGNOSIS — B9789 Other viral agents as the cause of diseases classified elsewhere: Secondary | ICD-10-CM

## 2018-09-16 DIAGNOSIS — J019 Acute sinusitis, unspecified: Principal | ICD-10-CM

## 2018-09-16 DIAGNOSIS — J302 Other seasonal allergic rhinitis: Secondary | ICD-10-CM

## 2018-11-20 ENCOUNTER — Encounter: Payer: Self-pay | Admitting: Family Medicine

## 2018-11-20 ENCOUNTER — Ambulatory Visit (INDEPENDENT_AMBULATORY_CARE_PROVIDER_SITE_OTHER): Payer: BLUE CROSS/BLUE SHIELD | Admitting: Family Medicine

## 2018-11-20 ENCOUNTER — Other Ambulatory Visit: Payer: Self-pay

## 2018-11-20 VITALS — BP 146/73 | HR 68 | Temp 98.2°F | Wt 207.2 lb

## 2018-11-20 DIAGNOSIS — R42 Dizziness and giddiness: Secondary | ICD-10-CM

## 2018-11-20 DIAGNOSIS — I10 Essential (primary) hypertension: Secondary | ICD-10-CM | POA: Diagnosis not present

## 2018-11-20 MED ORDER — MECLIZINE HCL 12.5 MG PO TABS: 12.5000 mg | ORAL_TABLET | Freq: Three times a day (TID) | ORAL | 0 refills | Status: DC | PRN

## 2018-11-20 NOTE — Progress Notes (Signed)
Patient: Barbara Kaiser Female    DOB: 12/19/1955   63 y.o.   MRN: [id number] Visit Date: 11/20/2018  Today's Provider: Vernie Murders, PA   Chief Complaint  Patient presents with  . Dizziness   Subjective:     Dizziness  This is a recurrent problem. The current episode started 1 to 4 weeks ago. The problem occurs intermittently. The problem has been unchanged. Associated symptoms include vertigo. Pertinent negatives include no chest pain, fatigue, headaches or weakness. The symptoms are aggravated by bending, standing and twisting.  Patient was diagnose with vertigo in the past.  Past Medical History:  Diagnosis Date  . Allergic rhinitis   . Anemia, iron deficiency   . Asthma   . Colon polyps 3/10  . Complication of anesthesia    hard to wake  . Uterine fibroid    with heavy menses and anemia (resolved by ablation)   Past Surgical History:  Procedure Laterality Date  . ABDOMINAL HYSTERECTOMY    . CARDIAC CATHETERIZATION  05/2005   mild CAD  . DILATION AND CURETTAGE OF UTERUS  12/08   hysteroscopy and endometrial ablation  . LAPAROSCOPIC SUPRACERVICAL HYSTERECTOMY  12/09/2011   Procedure: LAPAROSCOPIC SUPRACERVICAL HYSTERECTOMY;  Surgeon: Osborne Oman, MD;  Location: [facility];  Service: Gynecology;  Laterality: N/A;  . NASAL SINUS SURGERY  11/07  . SALPINGOOPHORECTOMY  12/09/2011   Procedure: SALPINGO OOPHERECTOMY;  Surgeon: Osborne Oman, MD;  Location: [facility];  Service: Gynecology;  Laterality: Bilateral;  . SUPRACERVICAL ABDOMINAL HYSTERECTOMY  12/09/2011   Procedure: HYSTERECTOMY SUPRACERVICAL ABDOMINAL;  Surgeon: Osborne Oman, MD;  Location: [facility];  Service: Gynecology;  Laterality: N/A;  . TUBAL LIGATION     Family History  Problem Relation Age of Onset  . Hypertension Mother   . Cancer Mother 20       uterine  . Hypertension Father   . Heart disease Other        MI  . Diabetes Other   . Stroke Other   . Heart disease Brother        age 78  and 47  . Cancer Other        lung  . Breast cancer Neg Hx    Allergies  Allergen Reactions  . Codeine Nausea And Vomiting    Current Outpatient Medications:  .  aspirin 81 MG tablet, Take 81 mg by mouth as needed., Disp: , Rfl:  .  azelastine (ASTELIN) 0.1 % nasal spray, 2 sprays in each nostril daily until symptoms resolve, Disp: 30 mL, Rfl: 12 .  cyclobenzaprine (FLEXERIL) 5 MG tablet, Take 1 tablet (5 mg total) by mouth 3 (three) times daily as needed for muscle spasms., Disp: 20 tablet, Rfl: 0 .  fexofenadine (ALLEGRA) 180 MG tablet, Take 1 tablet (180 mg total) by mouth daily., Disp: 30 tablet, Rfl: 0 .  fluticasone (FLONASE) 50 MCG/ACT nasal spray, INHALE 2 SPRAYS IN EACH NOSTRIL ONCE DAILY AS NEEDED, Disp: 16 g, Rfl: 11 .  ibuprofen (ADVIL,MOTRIN) 800 MG tablet, Take 1 tablet (800 mg total) by mouth every 8 (eight) hours as needed., Disp: 30 tablet, Rfl: 0 .  meclizine (ANTIVERT) 12.5 MG tablet, Take 1 tablet (12.5 mg total) by mouth 3 (three) times daily as needed for dizziness., Disp: 30 tablet, Rfl: 0 .  metoprolol tartrate (LOPRESSOR) 25 MG tablet, TAKE 1 TABLET BY MOUTH TWICE A DAY, Disp: 60 tablet, Rfl: 11 .  Multiple Vitamin (MULTIVITAMIN) tablet, Take  1 tablet by mouth daily., Disp: , Rfl:  .  simvastatin (ZOCOR) 20 MG tablet, TAKE 1 TABLET (20 MG TOTAL) BY MOUTH DAILY., Disp: 90 tablet, Rfl: 2  Review of Systems  Constitutional: Negative.  Negative for fatigue.  Respiratory: Negative.   Cardiovascular: Negative for chest pain.  Genitourinary: Negative.   Neurological: Positive for dizziness and vertigo. Negative for weakness and headaches.   Social History   Tobacco Use  . Smoking status: Former Smoker    Packs/day: 0.25    Years: 24.00    Pack years: 6.00    Last attempt to quit: 07/01/2001    Years since quitting: 17.4  . Smokeless tobacco: Never Used  Substance Use Topics  . Alcohol use: No    Alcohol/week: 0.0 standard drinks     Objective:   BP (!)  146/73 (BP Location: Right Arm, Patient Position: Sitting, Cuff Size: Normal)   Pulse 68   Temp 98.2 F (36.8 C) (Oral)   Wt 207 lb 3.2 oz (94 kg)   LMP 11/28/2011   SpO2 97%   BMI 33.44 kg/m  Vitals:   11/20/18 1419  BP: (!) 146/73  Pulse: 68  Temp: 98.2 F (36.8 C)  TempSrc: Oral  SpO2: 97%  Weight: 207 lb 3.2 oz (94 kg)    Physical Exam Constitutional:      General: She is not in acute distress.    Appearance: She is well-developed.  HENT:     Head: Normocephalic and atraumatic.     Right Ear: Hearing and tympanic membrane normal.     Left Ear: Hearing and tympanic membrane normal.     Nose: Nose normal.  Eyes:     General: Lids are normal. No scleral icterus.       Right eye: No discharge.        Left eye: No discharge.     Conjunctiva/sclera: Conjunctivae normal.  Neck:     Musculoskeletal: Neck supple.     Vascular: No carotid bruit.     Comments: Large visible pulse in right lower carotid without bruit. Cardiovascular:     Rate and Rhythm: Normal rate and regular rhythm.     Heart sounds: Normal heart sounds.  Pulmonary:     Effort: Pulmonary effort is normal. No respiratory distress.     Breath sounds: Normal breath sounds.  Abdominal:     General: Bowel sounds are normal.     Palpations: Abdomen is soft.  Musculoskeletal: Normal range of motion.  Skin:    Findings: No lesion or rash.  Neurological:     Mental Status: She is alert and oriented to person, place, and time.     Cranial Nerves: No cranial nerve deficit.     Gait: Gait normal.  Psychiatric:        Speech: Speech normal.        Behavior: Behavior normal.        Thought Content: Thought content normal.        Assessment & Plan    1. Vertigo Onset over the past couple days. States she feels a spinning sensation with slight nausea (no vomiting) when she looks up or down. Also, happens when she lies on the right ear. No fever, sore throat, cough, congestion, sneezing or hearing loss. Had  similar episode for 2-3 days over 6 months ago and it responded quickly to Meclizine. Suspect viral syndrome. Will refill Antivert and follow up if no better in 3-4 days. - meclizine (ANTIVERT) 12.5  MG tablet; Take 1 tablet (12.5 mg total) by mouth 3 (three) times daily as needed for dizziness.  Dispense: 30 tablet; Refill: 0  2. HYPERTENSION, MILD Tolerating Metoprolol Tartrate 25 mg BID. No palpitations, chest pain, dyspnea or peripheral edema. Recommend she restrict salt intake and monitor BP at home. Limit caffeine, also.     Vernie Murders, PA  [facility]

## 2018-11-24 ENCOUNTER — Encounter: Payer: Self-pay | Admitting: *Deleted

## 2018-11-24 DIAGNOSIS — M5136 Other intervertebral disc degeneration, lumbar region: Secondary | ICD-10-CM | POA: Diagnosis not present

## 2018-11-24 DIAGNOSIS — M5416 Radiculopathy, lumbar region: Secondary | ICD-10-CM | POA: Diagnosis not present

## 2018-11-24 DIAGNOSIS — M48062 Spinal stenosis, lumbar region with neurogenic claudication: Secondary | ICD-10-CM | POA: Diagnosis not present

## 2018-12-22 DIAGNOSIS — Z1159 Encounter for screening for other viral diseases: Secondary | ICD-10-CM | POA: Diagnosis not present

## 2019-01-04 ENCOUNTER — Telehealth: Payer: Self-pay | Admitting: Family Medicine

## 2019-01-04 DIAGNOSIS — R059 Cough, unspecified: Secondary | ICD-10-CM

## 2019-01-04 DIAGNOSIS — R05 Cough: Secondary | ICD-10-CM

## 2019-01-04 MED ORDER — AZITHROMYCIN 250 MG PO TABS: ORAL_TABLET | ORAL | 0 refills | Status: DC

## 2019-01-04 NOTE — Telephone Encounter (Signed)
Yes. Her mother, Clementine caught the virus from her. She reports that this is the 3rd week and she is still coughing. She is requesting an abx. She also mentions that she is having loose stools. She does not describe it as diarrhea. [facility].

## 2019-01-04 NOTE — Telephone Encounter (Signed)
Any known covid exposure?

## 2019-01-04 NOTE — Telephone Encounter (Signed)
Please advise. Has not taken anything OTC for symptoms. Does not feel its food related.

## 2019-01-04 NOTE — Telephone Encounter (Signed)
Patient has already been tested for covid and it was positive. She was tested 2-3 weeks ago. Medication was sent into the pharmacy. Patient advised.

## 2019-01-04 NOTE — Telephone Encounter (Signed)
pt has had a little diarrhea and a little cough for a couple of days.  CB# [phone]  Thanks Con Memos

## 2019-01-04 NOTE — Telephone Encounter (Signed)
I would probably order a Covid test and sent in [place].

## 2019-01-19 ENCOUNTER — Telehealth: Payer: Self-pay | Admitting: Family Medicine

## 2019-01-19 NOTE — Telephone Encounter (Signed)
I do not call in antibiotics.

## 2019-01-19 NOTE — Telephone Encounter (Signed)
Pt called having rt facial pain.  She thinks she has a possible sinus infection  She says he normally calls in an antibiotic  [facility]  Please advise  CB#  [phone]

## 2019-01-20 NOTE — Telephone Encounter (Signed)
Patient needs to be set up for virtual visit

## 2019-01-21 NOTE — Telephone Encounter (Signed)
Pt said she was feeling better since she started using her nasal spray,.  She sis not want to do a virtual appt.  She said if she feels worse she will call us back.Marland Kitchen  teri

## 2019-02-01 ENCOUNTER — Telehealth: Payer: Self-pay

## 2019-02-01 DIAGNOSIS — I10 Essential (primary) hypertension: Secondary | ICD-10-CM

## 2019-02-01 NOTE — Telephone Encounter (Signed)
Patient reports bp 193/103 this morning. Patient reports that bp elevated over the weekend. Patient requesting call back to see if she needs to increase or change medication.

## 2019-02-01 NOTE — Telephone Encounter (Signed)
Spoke to patient regarding elevated blood pressure readings. She states that she has been eating hamburgers, french fries and drinking sodas this weekend. Blood pressure has been elevated. Readings are 190/103 or higher. She has been experiencing a headache on the right side of her head. Advised patient to cut back on fast foods and drink more water. Please advise if medication change is necessary at this time.

## 2019-02-03 NOTE — Telephone Encounter (Signed)
If still up would start amlodipine 5mg  daily and f/u 1 month.

## 2019-02-08 MED ORDER — AMLODIPINE BESYLATE 5 MG PO TABS: 5.0000 mg | ORAL_TABLET | Freq: Every day | ORAL | 3 refills | Status: DC

## 2019-02-08 NOTE — Telephone Encounter (Signed)
Patient reports that BP has still been averaging in the 150s/80s. Amlodipine has been sent into the pharmacy. Advised patient to f/u in 1 month.

## 2019-02-22 ENCOUNTER — Other Ambulatory Visit: Payer: Self-pay

## 2019-02-22 ENCOUNTER — Ambulatory Visit (INDEPENDENT_AMBULATORY_CARE_PROVIDER_SITE_OTHER): Payer: BC Managed Care – PPO | Admitting: Family Medicine

## 2019-02-22 DIAGNOSIS — J0101 Acute recurrent maxillary sinusitis: Secondary | ICD-10-CM | POA: Diagnosis not present

## 2019-02-22 MED ORDER — AMOXICILLIN-POT CLAVULANATE 875-125 MG PO TABS: 1.0000 | ORAL_TABLET | Freq: Two times a day (BID) | ORAL | 2 refills | Status: DC

## 2019-03-04 NOTE — Progress Notes (Signed)
Virtual Visit via Telephone Note  I connected with Barbara Kaiser on 03/04/19 at  3:20 PM EDT by telephone and verified that I am speaking with the correct person using two identifiers.  Location: Patient: Home Provider: office   I discussed the limitations, risks, security and privacy concerns of performing an evaluation and management service by telephone and the availability of in person appointments. I also discussed with the patient that there may be a patient responsible charge related to this service. The patient expressed understanding and agreed to proceed.   History of Present Illness: Sinus congestion and now pain in maxiallary sinus pain.   Observations/Objective:   Assessment and Plan:   Follow Up Instructions: 1. Acute recurrent maxillary sinusitis Augmentin for 10 days.    I discussed the assessment and treatment plan with the patient. The patient was provided an opportunity to ask questions and all were answered. The patient agreed with the plan and demonstrated an understanding of the instructions.   The patient was advised to call back or seek an in-person evaluation if the symptoms worsen or if the condition fails to improve as anticipated.  I provided 10 minutes of non-face-to-face time during this encounter.   [name], MD

## 2019-03-16 DIAGNOSIS — H8111 Benign paroxysmal vertigo, right ear: Secondary | ICD-10-CM | POA: Diagnosis not present

## 2019-03-16 DIAGNOSIS — J310 Chronic rhinitis: Secondary | ICD-10-CM | POA: Diagnosis not present

## 2019-03-16 DIAGNOSIS — R42 Dizziness and giddiness: Secondary | ICD-10-CM | POA: Diagnosis not present

## 2019-03-16 DIAGNOSIS — J343 Hypertrophy of nasal turbinates: Secondary | ICD-10-CM | POA: Diagnosis not present

## 2019-03-16 DIAGNOSIS — J330 Polyp of nasal cavity: Secondary | ICD-10-CM | POA: Diagnosis not present

## 2019-03-19 ENCOUNTER — Other Ambulatory Visit: Payer: Self-pay | Admitting: Family Medicine

## 2019-03-19 DIAGNOSIS — E7849 Other hyperlipidemia: Secondary | ICD-10-CM

## 2019-03-24 ENCOUNTER — Ambulatory Visit: Payer: BLUE CROSS/BLUE SHIELD

## 2019-04-09 DIAGNOSIS — H8111 Benign paroxysmal vertigo, right ear: Secondary | ICD-10-CM | POA: Diagnosis not present

## 2019-04-09 DIAGNOSIS — R42 Dizziness and giddiness: Secondary | ICD-10-CM | POA: Diagnosis not present

## 2019-04-09 DIAGNOSIS — J320 Chronic maxillary sinusitis: Secondary | ICD-10-CM | POA: Diagnosis not present

## 2019-04-09 DIAGNOSIS — J310 Chronic rhinitis: Secondary | ICD-10-CM | POA: Diagnosis not present

## 2019-04-09 DIAGNOSIS — J338 Other polyp of sinus: Secondary | ICD-10-CM | POA: Diagnosis not present

## 2019-04-20 ENCOUNTER — Other Ambulatory Visit: Payer: Self-pay

## 2019-04-20 ENCOUNTER — Ambulatory Visit: Payer: Self-pay

## 2019-04-20 DIAGNOSIS — Z23 Encounter for immunization: Secondary | ICD-10-CM

## 2019-04-23 ENCOUNTER — Other Ambulatory Visit: Payer: Self-pay | Admitting: Otolaryngology

## 2019-04-23 DIAGNOSIS — J329 Chronic sinusitis, unspecified: Secondary | ICD-10-CM

## 2019-04-29 ENCOUNTER — Other Ambulatory Visit: Payer: BLUE CROSS/BLUE SHIELD

## 2019-04-30 ENCOUNTER — Other Ambulatory Visit: Payer: Self-pay

## 2019-04-30 ENCOUNTER — Ambulatory Visit
Admission: RE | Admit: 2019-04-30 | Discharge: 2019-04-30 | Disposition: A | Discharge status: Home / Self Care | Payer: BC Managed Care – PPO | Source: Ambulatory Visit | Attending: Otolaryngology | Admitting: Otolaryngology

## 2019-04-30 DIAGNOSIS — J329 Chronic sinusitis, unspecified: Secondary | ICD-10-CM

## 2019-05-05 ENCOUNTER — Other Ambulatory Visit: Payer: Self-pay | Admitting: Family Medicine

## 2019-05-05 DIAGNOSIS — J338 Other polyp of sinus: Secondary | ICD-10-CM | POA: Diagnosis not present

## 2019-05-05 DIAGNOSIS — I10 Essential (primary) hypertension: Secondary | ICD-10-CM

## 2019-05-05 DIAGNOSIS — J324 Chronic pansinusitis: Secondary | ICD-10-CM | POA: Diagnosis not present

## 2019-05-12 ENCOUNTER — Other Ambulatory Visit: Payer: Self-pay | Admitting: Family Medicine

## 2019-05-12 DIAGNOSIS — Z1231 Encounter for screening mammogram for malignant neoplasm of breast: Secondary | ICD-10-CM

## 2019-06-04 ENCOUNTER — Other Ambulatory Visit: Payer: Self-pay | Admitting: Otolaryngology

## 2019-06-14 ENCOUNTER — Other Ambulatory Visit: Payer: Self-pay

## 2019-06-14 ENCOUNTER — Encounter (HOSPITAL_BASED_OUTPATIENT_CLINIC_OR_DEPARTMENT_OTHER): Payer: Self-pay | Admitting: Otolaryngology

## 2019-06-15 ENCOUNTER — Other Ambulatory Visit (HOSPITAL_COMMUNITY)
Admission: RE | Admit: 2019-06-15 | Discharge: 2019-06-15 | Disposition: A | Discharge status: Home / Self Care | Payer: BC Managed Care – PPO | Source: Ambulatory Visit | Attending: Otolaryngology | Admitting: Otolaryngology

## 2019-06-15 ENCOUNTER — Encounter (HOSPITAL_BASED_OUTPATIENT_CLINIC_OR_DEPARTMENT_OTHER)
Admission: RE | Admit: 2019-06-15 | Discharge: 2019-06-15 | Disposition: A | Discharge status: Home / Self Care | Payer: BC Managed Care – PPO | Source: Ambulatory Visit | Attending: Otolaryngology | Admitting: Otolaryngology

## 2019-06-15 DIAGNOSIS — Z01812 Encounter for preprocedural laboratory examination: Secondary | ICD-10-CM | POA: Insufficient documentation

## 2019-06-15 DIAGNOSIS — Z20828 Contact with and (suspected) exposure to other viral communicable diseases: Secondary | ICD-10-CM | POA: Diagnosis not present

## 2019-06-15 DIAGNOSIS — Z01810 Encounter for preprocedural cardiovascular examination: Secondary | ICD-10-CM | POA: Insufficient documentation

## 2019-06-16 LAB — NOVEL CORONAVIRUS, NAA (HOSP ORDER, SEND-OUT TO REF LAB; TAT 18-24 HRS): SARS-CoV-2, NAA: NOT DETECTED

## 2019-06-17 NOTE — Anesthesia Preprocedure Evaluation (Addendum)
Anesthesia Evaluation  Patient identified by MRN, date of birth, ID band Patient awake    Reviewed: Allergy & Precautions, NPO status , Patient's Chart, lab work & pertinent test results  History of Anesthesia Complications (+) PONV and history of anesthetic complications  Airway Mallampati: II  TM Distance: >3 FB Neck ROM: Full    Dental  (+) Edentulous Upper, Edentulous Lower, Dental Advisory Given   Pulmonary former smoker,    Pulmonary exam normal        Cardiovascular hypertension, Pt. on medications + CAD  Normal cardiovascular exam     Neuro/Psych negative neurological ROS  negative psych ROS   GI/Hepatic negative GI ROS, Neg liver ROS,   Endo/Other  negative endocrine ROS  Renal/GU negative Renal ROS     Musculoskeletal negative musculoskeletal ROS (+)   Abdominal   Peds  Hematology negative hematology ROS (+)   Anesthesia Other Findings Day of surgery medications reviewed with the patient.  Reproductive/Obstetrics                            Anesthesia Physical Anesthesia Plan  ASA: II  Anesthesia Plan: General   Post-op Pain Management:    Induction: Intravenous  PONV Risk Score and Plan: 4 or greater and Ondansetron, Dexamethasone, Midazolam and Scopolamine patch - Pre-op  Airway Management Planned: Oral ETT  Additional Equipment:   Intra-op Plan:   Post-operative Plan: Extubation in OR  Informed Consent: I have reviewed the patients History and Physical, chart, labs and discussed the procedure including the risks, benefits and alternatives for the proposed anesthesia with the patient or authorized representative who has indicated his/her understanding and acceptance.     Dental advisory given  Plan Discussed with: Anesthesiologist and CRNA  Anesthesia Plan Comments:        Anesthesia Quick Evaluation

## 2019-06-18 ENCOUNTER — Ambulatory Visit (HOSPITAL_BASED_OUTPATIENT_CLINIC_OR_DEPARTMENT_OTHER): Payer: BC Managed Care – PPO | Admitting: Anesthesiology

## 2019-06-18 ENCOUNTER — Encounter (HOSPITAL_BASED_OUTPATIENT_CLINIC_OR_DEPARTMENT_OTHER): Payer: Self-pay | Admitting: Otolaryngology

## 2019-06-18 ENCOUNTER — Other Ambulatory Visit: Payer: Self-pay

## 2019-06-18 ENCOUNTER — Encounter (HOSPITAL_BASED_OUTPATIENT_CLINIC_OR_DEPARTMENT_OTHER)
Admission: RE | Disposition: A | Discharge status: Home / Self Care | Payer: Self-pay | Source: Home / Self Care | Attending: Otolaryngology

## 2019-06-18 ENCOUNTER — Ambulatory Visit (HOSPITAL_BASED_OUTPATIENT_CLINIC_OR_DEPARTMENT_OTHER)
Admission: RE | Admit: 2019-06-18 | Discharge: 2019-06-18 | Disposition: A | Discharge status: Home / Self Care | Payer: BC Managed Care – PPO | Attending: Otolaryngology | Admitting: Otolaryngology

## 2019-06-18 DIAGNOSIS — J339 Nasal polyp, unspecified: Secondary | ICD-10-CM | POA: Insufficient documentation

## 2019-06-18 DIAGNOSIS — I2510 Atherosclerotic heart disease of native coronary artery without angina pectoris: Secondary | ICD-10-CM | POA: Insufficient documentation

## 2019-06-18 DIAGNOSIS — J321 Chronic frontal sinusitis: Secondary | ICD-10-CM | POA: Diagnosis not present

## 2019-06-18 DIAGNOSIS — J338 Other polyp of sinus: Secondary | ICD-10-CM | POA: Diagnosis not present

## 2019-06-18 DIAGNOSIS — J324 Chronic pansinusitis: Secondary | ICD-10-CM | POA: Insufficient documentation

## 2019-06-18 DIAGNOSIS — Z87891 Personal history of nicotine dependence: Secondary | ICD-10-CM | POA: Insufficient documentation

## 2019-06-18 DIAGNOSIS — J329 Chronic sinusitis, unspecified: Secondary | ICD-10-CM | POA: Diagnosis not present

## 2019-06-18 DIAGNOSIS — J323 Chronic sphenoidal sinusitis: Secondary | ICD-10-CM | POA: Diagnosis not present

## 2019-06-18 DIAGNOSIS — J343 Hypertrophy of nasal turbinates: Secondary | ICD-10-CM | POA: Insufficient documentation

## 2019-06-18 DIAGNOSIS — J322 Chronic ethmoidal sinusitis: Secondary | ICD-10-CM | POA: Diagnosis not present

## 2019-06-18 DIAGNOSIS — J3489 Other specified disorders of nose and nasal sinuses: Secondary | ICD-10-CM | POA: Diagnosis not present

## 2019-06-18 DIAGNOSIS — I10 Essential (primary) hypertension: Secondary | ICD-10-CM | POA: Diagnosis not present

## 2019-06-18 DIAGNOSIS — J320 Chronic maxillary sinusitis: Secondary | ICD-10-CM | POA: Diagnosis not present

## 2019-06-18 HISTORY — DX: Essential (primary) hypertension: I10

## 2019-06-18 HISTORY — PX: MAXILLARY ANTROSTOMY: SHX2003

## 2019-06-18 HISTORY — DX: Other specified postprocedural states: Z98.890

## 2019-06-18 HISTORY — DX: Nausea with vomiting, unspecified: R11.2

## 2019-06-18 HISTORY — PX: SINUS ENDO WITH FUSION: SHX5329

## 2019-06-18 HISTORY — PX: TURBINATE REDUCTION: SHX6157

## 2019-06-18 HISTORY — PX: FRONTAL SINUS EXPLORATION: SHX6591

## 2019-06-18 HISTORY — PX: SPHENOIDECTOMY: SHX2421

## 2019-06-18 HISTORY — PX: ETHMOIDECTOMY: SHX5197

## 2019-06-18 SURGERY — ETHMOIDECTOMY
Anesthesia: General | Site: Nose | Laterality: Bilateral

## 2019-06-18 MED ORDER — PROMETHAZINE HCL 25 MG/ML IJ SOLN
6.2500 mg | INTRAMUSCULAR | Status: DC | PRN
  Administered 2019-06-18: 6.25 mg via INTRAVENOUS

## 2019-06-18 MED ORDER — ACETAMINOPHEN 500 MG PO TABS
1000.0000 mg | ORAL_TABLET | Freq: Once | ORAL | Status: AC
  Administered 2019-06-18: 1000 mg via ORAL

## 2019-06-18 MED ORDER — DEXAMETHASONE SODIUM PHOSPHATE 10 MG/ML IJ SOLN
INTRAMUSCULAR | Status: AC
  Filled 2019-06-18: qty 1

## 2019-06-18 MED ORDER — BUPIVACAINE HCL (PF) 0.5 % IJ SOLN
INTRAMUSCULAR | Status: AC
  Filled 2019-06-18: qty 30

## 2019-06-18 MED ORDER — OXYMETAZOLINE HCL 0.05 % NA SOLN
NASAL | Status: DC | PRN
  Administered 2019-06-18: 1 via TOPICAL

## 2019-06-18 MED ORDER — SCOPOLAMINE 1 MG/3DAYS TD PT72
MEDICATED_PATCH | TRANSDERMAL | Status: AC
  Filled 2019-06-18: qty 1

## 2019-06-18 MED ORDER — ONDANSETRON HCL 4 MG/2ML IJ SOLN
INTRAMUSCULAR | Status: DC | PRN
  Administered 2019-06-18: 4 mg via INTRAVENOUS

## 2019-06-18 MED ORDER — SCOPOLAMINE 1 MG/3DAYS TD PT72
1.0000 | MEDICATED_PATCH | TRANSDERMAL | Status: DC
  Administered 2019-06-18: 1.5 mg via TRANSDERMAL

## 2019-06-18 MED ORDER — CEFAZOLIN SODIUM-DEXTROSE 2-3 GM-%(50ML) IV SOLR
INTRAVENOUS | Status: DC | PRN
  Administered 2019-06-18: 2 g via INTRAVENOUS

## 2019-06-18 MED ORDER — PROMETHAZINE HCL 25 MG/ML IJ SOLN
INTRAMUSCULAR | Status: AC
  Filled 2019-06-18: qty 1

## 2019-06-18 MED ORDER — LIDOCAINE 2% (20 MG/ML) 5 ML SYRINGE
INTRAMUSCULAR | Status: AC
  Filled 2019-06-18: qty 5

## 2019-06-18 MED ORDER — PHENYLEPHRINE 40 MCG/ML (10ML) SYRINGE FOR IV PUSH (FOR BLOOD PRESSURE SUPPORT)
PREFILLED_SYRINGE | INTRAVENOUS | Status: DC | PRN
  Administered 2019-06-18: 80 ug via INTRAVENOUS
  Administered 2019-06-18: 120 ug via INTRAVENOUS

## 2019-06-18 MED ORDER — FENTANYL CITRATE (PF) 100 MCG/2ML IJ SOLN: 25.0000 ug | INTRAMUSCULAR | Status: DC | PRN

## 2019-06-18 MED ORDER — OXYMETAZOLINE HCL 0.05 % NA SOLN
NASAL | Status: AC
  Filled 2019-06-18: qty 30

## 2019-06-18 MED ORDER — FENTANYL CITRATE (PF) 100 MCG/2ML IJ SOLN
INTRAMUSCULAR | Status: DC | PRN
  Administered 2019-06-18: 100 ug via INTRAVENOUS

## 2019-06-18 MED ORDER — EPHEDRINE 5 MG/ML INJ
INTRAVENOUS | Status: AC
  Filled 2019-06-18: qty 10

## 2019-06-18 MED ORDER — CEFAZOLIN SODIUM 1 G IJ SOLR
INTRAMUSCULAR | Status: AC
  Filled 2019-06-18: qty 20

## 2019-06-18 MED ORDER — FENTANYL CITRATE (PF) 100 MCG/2ML IJ SOLN
INTRAMUSCULAR | Status: AC
  Filled 2019-06-18: qty 2

## 2019-06-18 MED ORDER — CELECOXIB 200 MG PO CAPS
ORAL_CAPSULE | ORAL | Status: AC
  Filled 2019-06-18: qty 2

## 2019-06-18 MED ORDER — MIDAZOLAM HCL 2 MG/2ML IJ SOLN: 1.0000 mg | INTRAMUSCULAR | Status: DC | PRN

## 2019-06-18 MED ORDER — SUGAMMADEX SODIUM 200 MG/2ML IV SOLN
INTRAVENOUS | Status: DC | PRN
  Administered 2019-06-18: 190 mg via INTRAVENOUS

## 2019-06-18 MED ORDER — ROCURONIUM BROMIDE 10 MG/ML (PF) SYRINGE
PREFILLED_SYRINGE | INTRAVENOUS | Status: AC
  Filled 2019-06-18: qty 10

## 2019-06-18 MED ORDER — ROCURONIUM BROMIDE 10 MG/ML (PF) SYRINGE
PREFILLED_SYRINGE | INTRAVENOUS | Status: DC | PRN
  Administered 2019-06-18: 70 mg via INTRAVENOUS

## 2019-06-18 MED ORDER — PHENYLEPHRINE 40 MCG/ML (10ML) SYRINGE FOR IV PUSH (FOR BLOOD PRESSURE SUPPORT)
PREFILLED_SYRINGE | INTRAVENOUS | Status: AC
  Filled 2019-06-18: qty 10

## 2019-06-18 MED ORDER — PROPOFOL 10 MG/ML IV BOLUS
INTRAVENOUS | Status: AC
  Filled 2019-06-18: qty 20

## 2019-06-18 MED ORDER — OXYCODONE-ACETAMINOPHEN 5-325 MG PO TABS: 1.0000 | ORAL_TABLET | ORAL | 0 refills | Status: AC | PRN

## 2019-06-18 MED ORDER — FENTANYL CITRATE (PF) 100 MCG/2ML IJ SOLN: 50.0000 ug | INTRAMUSCULAR | Status: DC | PRN

## 2019-06-18 MED ORDER — LIDOCAINE-EPINEPHRINE 1 %-1:100000 IJ SOLN
INTRAMUSCULAR | Status: AC
  Filled 2019-06-18: qty 1

## 2019-06-18 MED ORDER — ONDANSETRON HCL 4 MG/2ML IJ SOLN
INTRAMUSCULAR | Status: AC
  Filled 2019-06-18: qty 2

## 2019-06-18 MED ORDER — PROPOFOL 10 MG/ML IV BOLUS
INTRAVENOUS | Status: DC | PRN
  Administered 2019-06-18: 170 mg via INTRAVENOUS

## 2019-06-18 MED ORDER — MUPIROCIN 2 % EX OINT
TOPICAL_OINTMENT | CUTANEOUS | Status: DC | PRN
  Administered 2019-06-18: 1 via NASAL

## 2019-06-18 MED ORDER — CELECOXIB 400 MG PO CAPS
400.0000 mg | ORAL_CAPSULE | Freq: Once | ORAL | Status: AC
  Administered 2019-06-18: 400 mg via ORAL

## 2019-06-18 MED ORDER — MUPIROCIN 2 % EX OINT
TOPICAL_OINTMENT | CUTANEOUS | Status: AC
  Filled 2019-06-18: qty 22

## 2019-06-18 MED ORDER — AMOXICILLIN 875 MG PO TABS: 875.0000 mg | ORAL_TABLET | Freq: Two times a day (BID) | ORAL | 0 refills | Status: AC

## 2019-06-18 MED ORDER — ACETAMINOPHEN 500 MG PO TABS
ORAL_TABLET | ORAL | Status: AC
  Filled 2019-06-18: qty 2

## 2019-06-18 MED ORDER — EPHEDRINE SULFATE-NACL 50-0.9 MG/10ML-% IV SOSY
PREFILLED_SYRINGE | INTRAVENOUS | Status: DC | PRN
  Administered 2019-06-18 (×3): 5 mg via INTRAVENOUS
  Administered 2019-06-18: 10 mg via INTRAVENOUS

## 2019-06-18 MED ORDER — LACTATED RINGERS IV SOLN: INTRAVENOUS | Status: DC

## 2019-06-18 MED ORDER — LIDOCAINE HCL (CARDIAC) PF 100 MG/5ML IV SOSY
PREFILLED_SYRINGE | INTRAVENOUS | Status: DC | PRN
  Administered 2019-06-18: 100 mg via INTRAVENOUS

## 2019-06-18 MED ORDER — DEXAMETHASONE SODIUM PHOSPHATE 10 MG/ML IJ SOLN
INTRAMUSCULAR | Status: DC | PRN
  Administered 2019-06-18: 10 mg via INTRAVENOUS
  Administered 2019-06-18: 6 mg via INTRAVENOUS

## 2019-06-18 MED ORDER — BUPIVACAINE HCL (PF) 0.25 % IJ SOLN
INTRAMUSCULAR | Status: AC
  Filled 2019-06-18: qty 30

## 2019-06-18 MED ORDER — MIDAZOLAM HCL 2 MG/2ML IJ SOLN
INTRAMUSCULAR | Status: AC
  Filled 2019-06-18: qty 2

## 2019-06-18 MED ORDER — MIDAZOLAM HCL 2 MG/2ML IJ SOLN
INTRAMUSCULAR | Status: DC | PRN
  Administered 2019-06-18: 2 mg via INTRAVENOUS

## 2019-06-18 SURGICAL SUPPLY — 54 items
BLADE RAD40 ROTATE 4M 4 5PK (BLADE) IMPLANT
BLADE RAD60 ROTATE M4 4 5PK (BLADE) IMPLANT
BLADE ROTATE RAD 12 4 M4 (BLADE) IMPLANT
BLADE ROTATE RAD 40 4 M4 (BLADE) IMPLANT
BLADE ROTATE TRICUT 4X13 M4 (BLADE) ×2 IMPLANT
BLADE TRICUT ROTATE M4 4 5PK (BLADE) IMPLANT
BUR HS RAD FRONTAL 3 (BURR) IMPLANT
CANISTER SUC SOCK COL 7IN (MISCELLANEOUS) ×2 IMPLANT
CANISTER SUCT 1200ML W/VALVE (MISCELLANEOUS) ×2 IMPLANT
COAGULATOR SUCT 8FR VV (MISCELLANEOUS) ×1 IMPLANT
COVER WAND RF STERILE (DRAPES) IMPLANT
DECANTER SPIKE VIAL GLASS SM (MISCELLANEOUS) IMPLANT
DRSG NASAL KENNEDY LMNT 8CM (GAUZE/BANDAGES/DRESSINGS) IMPLANT
DRSG NASOPORE 8CM (GAUZE/BANDAGES/DRESSINGS) ×1 IMPLANT
DRSG TELFA 3X8 NADH (GAUZE/BANDAGES/DRESSINGS) IMPLANT
ELECT REM PT RETURN 9FT ADLT (ELECTROSURGICAL) ×2
ELECTRODE REM PT RTRN 9FT ADLT (ELECTROSURGICAL) IMPLANT
GLOVE BIO SURGEON STRL SZ7 (GLOVE) ×1 IMPLANT
GLOVE BIO SURGEON STRL SZ7.5 (GLOVE) ×2 IMPLANT
GLOVE BIOGEL PI IND STRL 7.5 (GLOVE) IMPLANT
GLOVE BIOGEL PI INDICATOR 7.5 (GLOVE) ×1
GOWN STRL REUS W/ TWL LRG LVL3 (GOWN DISPOSABLE) ×2 IMPLANT
GOWN STRL REUS W/ TWL XL LVL3 (GOWN DISPOSABLE) IMPLANT
GOWN STRL REUS W/TWL LRG LVL3 (GOWN DISPOSABLE) ×1
GOWN STRL REUS W/TWL XL LVL3 (GOWN DISPOSABLE) ×1
HEMOSTAT SURGICEL 2X14 (HEMOSTASIS) IMPLANT
IV NS 500ML (IV SOLUTION) ×1
IV NS 500ML BAXH (IV SOLUTION) ×1 IMPLANT
NDL HYPO 25X1 1.5 SAFETY (NEEDLE) ×1 IMPLANT
NDL SPNL 25GX3.5 QUINCKE BL (NEEDLE) IMPLANT
NEEDLE HYPO 25X1 1.5 SAFETY (NEEDLE) ×2 IMPLANT
NEEDLE SPNL 25GX3.5 QUINCKE BL (NEEDLE) IMPLANT
NS IRRIG 1000ML POUR BTL (IV SOLUTION) ×1 IMPLANT
PACK BASIN DAY SURGERY FS (CUSTOM PROCEDURE TRAY) ×2 IMPLANT
PACK ENT DAY SURGERY (CUSTOM PROCEDURE TRAY) ×2 IMPLANT
PAD DRESSING TELFA 3X8 NADH (GAUZE/BANDAGES/DRESSINGS) IMPLANT
SLEEVE SCD COMPRESS KNEE MED (MISCELLANEOUS) ×2 IMPLANT
SOLUTION BUTLER CLEAR DIP (MISCELLANEOUS) ×2 IMPLANT
SPLINT NASAL AIRWAY SILICONE (MISCELLANEOUS) IMPLANT
SPONGE GAUZE 2X2 8PLY STRL LF (GAUZE/BANDAGES/DRESSINGS) ×2 IMPLANT
SPONGE NEURO XRAY DETECT 1X3 (DISPOSABLE) ×2 IMPLANT
SUCTION FRAZIER HANDLE 10FR (MISCELLANEOUS) ×1
SUCTION TUBE FRAZIER 10FR DISP (MISCELLANEOUS) IMPLANT
SUT CHROMIC 4 0 P 3 18 (SUTURE) IMPLANT
SUT PLAIN 4 0 ~~LOC~~ 1 (SUTURE) IMPLANT
SUT PROLENE 3 0 PS 2 (SUTURE) IMPLANT
SYR 50ML LL SCALE MARK (SYRINGE) ×2 IMPLANT
TOWEL GREEN STERILE FF (TOWEL DISPOSABLE) ×2 IMPLANT
TRACKER ENT INSTRUMENT (MISCELLANEOUS) ×2 IMPLANT
TRACKER ENT PATIENT (MISCELLANEOUS) ×2 IMPLANT
TUBE CONNECTING 20X1/4 (TUBING) ×2 IMPLANT
TUBE SALEM SUMP 16 FR W/ARV (TUBING) IMPLANT
TUBING STRAIGHTSHOT EPS 5PK (TUBING) ×2 IMPLANT
YANKAUER SUCT BULB TIP NO VENT (SUCTIONS) ×2 IMPLANT

## 2019-06-18 NOTE — Discharge Instructions (Addendum)

## 2019-06-18 NOTE — H&P (Signed)
Cc: Sinonasal polyps, chronic rhinosinusitis  HPI: The patient is a 63 year old female who returns today for her follow-up evaluation. The patient was previously seen for chronic nasal congestion and sinonasal polyps.  The patient was previously treated with Flonase nasal spray and Prednisone dosepak.  The patient previously underwent bilateral endoscopic sinus surgery to treat her sinonasal polyps more than 10 years ago.  She returns today complaining of persistent facial pressure and discomfort.  Her nasal congestion has fluctuated over time.  She has not noted any significant improvement with the use of Prednisone and Flonase.  The patient recently underwent a sinus CT scan.  The CT showed complete opacifications of her frontal, ethmoid, and sphenoid sinuses.  Diffuse thick mucosal thickening was also noted throughout the remaining paranasal sinuses.  The findings are consistent with diffuse sinonasal polyps, involving the nasal and sinus cavities.  It should be noted the patient was previously treated by an allergist without improvement in her symptoms.   Exam: The nasal cavities were decongested and anesthetised with a combination of oxymetazoline and 4% lidocaine solution.  The flexible scope was inserted into the right nasal cavity.  Endoscopy of the inferior and middle meatus was performed.  Edematous and polypoid mucosa was noted. Olfactory cleft was clear.  Nasopharynx was clear.  Turbinates were hypertrophied but without mass.  Incomplete response to decongestion.  The procedure was repeated on the contralateral side with polyps noted at the middle meatus.  The patient tolerated the procedure well.  Instructions were given to avoid eating or drinking for 2 hours.   Assessment: 1.  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  2.  A large nasal polyp is noted within her left nasal cavity.  3.  Bilateral inferior turbinate hypertrophy.    Plan: 1.  The  nasal endoscopy findings and the CT images are extensively reviewed with the patient.  2.  The patient should continue with Flonase nasal spray.   3.  In light of the above findings, the patient will benefit from undergoing bilateral revision sinus surgery to remove the sinonasal polyps.  The risks, benefits, alternatives and details of the procedure are reviewed with the patient.  Questions are invited and answered.  4.  The patient would like to proceed with the procedures.

## 2019-06-18 NOTE — Op Note (Addendum)
DATE OF PROCEDURE: 06/18/2019  OPERATIVE REPORT   SURGEON: Barbara Baptist, MD   PREOPERATIVE DIAGNOSES:  1. Bilateral chronic pansinusitis and polyposis. 2. Bilateral inferior turbinate hypertrophy.  3. Chronic nasal obstruction.  POSTOPERATIVE DIAGNOSES:  1. Bilateral chronic pansinusitis and polyposis. 2. Bilateral inferior turbinate hypertrophy.  3. Chronic nasal obstruction.  PROCEDURE PERFORMED:  1. Bilateral endoscopic frontal sinusotomy and polyp removal. 2. Bilateral endoscopic total ethmoidectomy and sphenoidotomy with polyp removal. 3. Bilateral endoscopic maxillary antrostomy with polyp removal. 4. FUSION stereotactic image guidance.   5. Bilateral partial inferior turbinate resection.   ANESTHESIA: General endotracheal tube anesthesia.   COMPLICATIONS: None.   ESTIMATED BLOOD LOSS: 250 mL.   INDICATION FOR PROCEDURE: Barbara Kaiser is a 63 y.o. female with a history of chronic nasal obstruction and sinonasal polyps.  The patient was previously treated with Flonase nasal spray and Prednisone dosepak.  The patient previously underwent bilateral endoscopic sinus surgery to treat her sinonasal polyps more than 10 years ago.   Her nasal congestion has fluctuated over time.  She has not noted any significant improvement with the use of Prednisone and Flonase.  The patient recently underwent a sinus CT scan.  The CT showed complete opacifications of her frontal, ethmoid, and sphenoid sinuses.  Diffuse thick mucosal thickening was also noted throughout the remaining paranasal sinuses.  The findings were consistent with diffuse sinonasal polyps, involving the nasal and sinus cavities.  Her inferior turbinates were also significantly hypertrophied.  It should be noted the patient was previously treated by an allergist without improvement in her symptoms. Based on the above findings, the decision was made for the patient to undergo the above-stated procedures. The risks, benefits,  alternatives, and details of the procedures were discussed with the patient. Questions were invited and answered. Informed consent was obtained.   DESCRIPTION OF PROCEDURE: The patient was taken to the operating room and placed supine on the operating table. General endotracheal tube anesthesia was administered by the anesthesiologist. The patient was positioned, and prepped and draped in the standard fashion for nasal surgery. Pledgets soaked with Afrin were placed in both nasal cavities for decongestion. The pledgets were subsequently removed. The FUSION stereotactic image guidance marker was placed. The image guidance system was functional throughout the case.  The inferior one half of both hypertrophied inferior turbinate was crossclamped with a Kelly clamp. The inferior one half of each inferior turbinate was then resected with a pair of cross cutting scissors. Hemostasis was achieved with a suction cautery device.   Using a 0 endoscope, the left nasal cavity was examined. Polypoid tissue was noted within the middle meatus. The polypoid tissue was removed using a combination of microdebrider and Blakesley forceps. The uncinate process was resected with a freer elevator. The maxillary antrum was entered and enlarged using a combination of backbiter and microdebrider. Polypoid tissue was removed from the left maxillary sinus.  Attention was then focused on the ethmoid sinuses. The bony partitions of the anterior and posterior ethmoid cavities were taken down. Polypoid tissue was noted and removed.  More polyps were noted to obstruct the left sphenoid opening. The polyps were removed. The sphenoid opening was entered and enlarged. More polyps were removed from the sphenoid sinus. Attention was then focused on the frontal sinus. The frontal recess was identified and enlarged by removing the surrounding bony partitions. Polypoid tissue was removed from the frontal recess. All 4 paranasal sinuses were  copiously irrigated with saline solution.  The same procedure was repeated on  the right side without exception. More polyps were noted on the right side. All polyps were removed.   The care of the patient was turned over to the anesthesiologist. The patient was awakened from anesthesia without difficulty. The patient was extubated and transferred to the recovery room in good condition.   OPERATIVE FINDINGS: Bilateral chronic pansinusitis and polyposis.  Bilateral inferior turbinate hypertrophy.   SPECIMEN: Bilateral sinus contents.  FOLLOWUP CARE: The patient be discharged home once she is awake and alert. The patient will be placed on Percocet 1 tablets p.o. q.4 hours p.r.n. pain, and amoxicillin 875 mg p.o. b.i.d. for 5 days. The patient will follow up in my office in approximately 1 week.  [name], MD

## 2019-06-18 NOTE — Anesthesia Postprocedure Evaluation (Signed)
Anesthesia Post Note  Patient: Rayvon Debruhl  Procedure(s) Performed: ETHMOIDECTOMY (Bilateral Nose) FRONTAL SINUS EXPLORATION (Bilateral Nose) SPHENOIDECTOMY WITH TISSUE REMOVAL (Bilateral Nose) SINUS ENDO WITH FUSION (Bilateral Nose) MAXILLARY ANTROSTOMY (Bilateral Nose) TURBINATE REDUCTION (Bilateral Nose)     Patient location during evaluation: PACU Anesthesia Type: General Level of consciousness: sedated Pain management: pain level controlled Vital Signs Assessment: post-procedure vital signs reviewed and stable Respiratory status: spontaneous breathing and respiratory function stable Cardiovascular status: stable Postop Assessment: no apparent nausea or vomiting Anesthetic complications: no    Last Vitals:  Vitals:   06/18/19 1045 06/18/19 1100  BP: (!) 141/69 140/78  Pulse: 73 78  Resp: 13 16  Temp:  37.1 C  SpO2: 97% 98%    Last Pain:  Vitals:   06/18/19 1100  TempSrc:   PainSc: 0-No pain                 Melody Savidge DANIEL

## 2019-06-18 NOTE — Transfer of Care (Signed)
Immediate Anesthesia Transfer of Care Note  Patient: Barbara Kaiser  Procedure(s) Performed: ETHMOIDECTOMY (Bilateral Nose) FRONTAL SINUS EXPLORATION (Bilateral Nose) SPHENOIDECTOMY WITH TISSUE REMOVAL (Bilateral Nose) SINUS ENDO WITH FUSION (Bilateral Nose) MAXILLARY ANTROSTOMY (Bilateral Nose) TURBINATE REDUCTION (Bilateral Nose)  Patient Location: PACU  Anesthesia Type:General  Level of Consciousness: awake, alert , oriented and patient cooperative  Airway & Oxygen Therapy: Patient Spontanous Breathing and Patient connected to face mask oxygen  Post-op Assessment: Report given to RN and Post -op Vital signs reviewed and stable  Post vital signs: Reviewed and stable  Last Vitals:  Vitals Value Taken Time  BP 155/71 06/18/19 1010  Temp    Pulse 74 06/18/19 1012  Resp 13 06/18/19 1012  SpO2 100 % 06/18/19 1012  Vitals shown include unvalidated device data.  Last Pain:  Vitals:   06/18/19 0716  TempSrc:   PainSc: 0-No pain      Patients Stated Pain Goal: 0 (0000000 [id number])  Complications: No apparent anesthesia complications

## 2019-06-18 NOTE — Anesthesia Procedure Notes (Signed)
Procedure Name: Intubation Date/Time: 06/18/2019 8:38 AM Performed by: Raenette Rover, CRNA Pre-anesthesia Checklist: Patient identified, Emergency Drugs available, Suction available and Patient being monitored Patient Re-evaluated:Patient Re-evaluated prior to induction Oxygen Delivery Method: Circle system utilized Preoxygenation: Pre-oxygenation with 100% oxygen Induction Type: IV induction Ventilation: Mask ventilation without difficulty Laryngoscope Size: Mac and 3 Grade View: Grade I Tube type: Oral Tube size: 7.0 mm Number of attempts: 1 Airway Equipment and Method: Stylet Placement Confirmation: ETT inserted through vocal cords under direct vision,  positive ETCO2 and breath sounds checked- equal and bilateral Secured at: 21 cm Tube secured with: Tape Dental Injury: Teeth and Oropharynx as per pre-operative assessment

## 2019-06-21 ENCOUNTER — Encounter: Payer: Self-pay | Admitting: *Deleted

## 2019-06-21 LAB — SURGICAL PATHOLOGY

## 2019-06-23 DIAGNOSIS — J338 Other polyp of sinus: Secondary | ICD-10-CM | POA: Diagnosis not present

## 2019-06-23 DIAGNOSIS — J324 Chronic pansinusitis: Secondary | ICD-10-CM | POA: Diagnosis not present

## 2019-06-29 ENCOUNTER — Ambulatory Visit
Admission: RE | Admit: 2019-06-29 | Discharge: 2019-06-29 | Disposition: A | Discharge status: Home / Self Care | Payer: BC Managed Care – PPO | Source: Ambulatory Visit | Attending: Family Medicine | Admitting: Family Medicine

## 2019-06-29 DIAGNOSIS — Z1231 Encounter for screening mammogram for malignant neoplasm of breast: Secondary | ICD-10-CM | POA: Diagnosis not present

## 2019-07-08 DIAGNOSIS — J338 Other polyp of sinus: Secondary | ICD-10-CM | POA: Diagnosis not present

## 2019-07-08 DIAGNOSIS — J324 Chronic pansinusitis: Secondary | ICD-10-CM | POA: Diagnosis not present

## 2019-07-09 DIAGNOSIS — Z20822 Contact with and (suspected) exposure to covid-19: Secondary | ICD-10-CM | POA: Diagnosis not present

## 2019-07-14 DIAGNOSIS — Z20822 Contact with and (suspected) exposure to covid-19: Secondary | ICD-10-CM | POA: Diagnosis not present

## 2019-08-03 ENCOUNTER — Other Ambulatory Visit: Payer: Self-pay

## 2019-08-03 ENCOUNTER — Emergency Department
Admission: EM | Admit: 2019-08-03 | Discharge: 2019-08-03 | Disposition: A | Discharge status: Home / Self Care | Payer: BC Managed Care – PPO | Attending: Emergency Medicine | Admitting: Emergency Medicine

## 2019-08-03 ENCOUNTER — Encounter: Payer: Self-pay | Admitting: Emergency Medicine

## 2019-08-03 DIAGNOSIS — K5900 Constipation, unspecified: Secondary | ICD-10-CM | POA: Diagnosis not present

## 2019-08-03 DIAGNOSIS — I10 Essential (primary) hypertension: Secondary | ICD-10-CM | POA: Insufficient documentation

## 2019-08-03 DIAGNOSIS — Z79899 Other long term (current) drug therapy: Secondary | ICD-10-CM | POA: Diagnosis not present

## 2019-08-03 DIAGNOSIS — J45909 Unspecified asthma, uncomplicated: Secondary | ICD-10-CM | POA: Insufficient documentation

## 2019-08-03 DIAGNOSIS — K5641 Fecal impaction: Secondary | ICD-10-CM | POA: Diagnosis not present

## 2019-08-03 DIAGNOSIS — R109 Unspecified abdominal pain: Secondary | ICD-10-CM | POA: Diagnosis not present

## 2019-08-03 DIAGNOSIS — Z87891 Personal history of nicotine dependence: Secondary | ICD-10-CM | POA: Insufficient documentation

## 2019-08-03 DIAGNOSIS — I2510 Atherosclerotic heart disease of native coronary artery without angina pectoris: Secondary | ICD-10-CM | POA: Insufficient documentation

## 2019-08-03 MED ORDER — DOCUSATE SODIUM 100 MG PO CAPS
200.0000 mg | ORAL_CAPSULE | Freq: Once | ORAL | Status: AC
  Administered 2019-08-03: 200 mg via ORAL
  Filled 2019-08-03: qty 2

## 2019-08-03 MED ORDER — DOCUSATE SODIUM 100 MG PO CAPS: 100.0000 mg | ORAL_CAPSULE | Freq: Two times a day (BID) | ORAL | 2 refills | Status: AC

## 2019-08-03 MED ORDER — POLYETHYLENE GLYCOL 3350 17 GM/SCOOP PO POWD: 17.0000 g | Freq: Every day | ORAL | 0 refills | Status: DC | PRN

## 2019-08-03 NOTE — ED Triage Notes (Signed)
Pt here for constipation.  Has been taking some OTC meds and had a normal bowel movement end of last week but feels like stool is stuck again.  Pt feels like she needs to go but it is stuck.  Feels like stool is stuck at rectum.  Denies any pain in abdomen. No vomiting.  Will try and go and feels like it won't come out.

## 2019-08-03 NOTE — ED Provider Notes (Signed)
[facility] Emergency Department Provider Note  Time seen: 7:40 PM  I have reviewed the triage vital signs and the nursing notes.   HISTORY  Chief Complaint Fecal Impaction   HPI Barbara Kaiser is a 64 y.o. female with a past medical history of hypertension, constipation, presents to the emergency department for constipation.  According to the patient  she states she has been constipated, feels hard stool at her rectum but cannot get it out.  States she feels like she needs to have a bowel movement but it will not come out.  Has tried over-the-counter Colace without success so she came to the emergency department.  Denies any abdominal "pain" but states some mild discomfort which she describes as a feeling like she needs to have a bowel movement.  Patient states she had a colonoscopy back in December with several polyps, states her bowel movements have been constipated ever since that time.  Denies any fever cough or shortness of breath.  Largely negative review of systems otherwise.  Past Medical History:  Diagnosis Date  . Allergic rhinitis   . Anemia, iron deficiency   . Colon polyps 3/10  . Complication of anesthesia    hard to wake  . Hypertension   . PONV (postoperative nausea and vomiting)   . Uterine fibroid    with heavy menses and anemia (resolved by ablation)    Patient Active Problem List   Diagnosis Date Noted  . Impacted cerumen 02/12/2017  . Encounter for routine gynecological examination 01/11/2015  . Acute sinusitis 07/26/2014  . Benign paroxysmal positional vertigo 07/26/2014  . Personal history of colonic polyps 12/06/2013  . Obesity 12/06/2013  . Routine general medical examination at a health care facility 08/16/2012  . CAD (coronary artery disease) 12/05/2011  . Preoperative cardiovascular examination 12/05/2011  . Hyperglycemia 08/20/2011  . Vitamin D deficiency 08/20/2011  . Hyperlipidemia 06/21/2009  . HYPERTENSION, MILD  02/13/2007  . ALLERGIC RHINITIS 02/13/2007  . ASTHMA 02/13/2007    Past Surgical History:  Procedure Laterality Date  . ABDOMINAL HYSTERECTOMY    . CARDIAC CATHETERIZATION  05/2005   mild CAD  . DILATION AND CURETTAGE OF UTERUS  12/08   hysteroscopy and endometrial ablation  . ETHMOIDECTOMY Bilateral 06/18/2019   Procedure: ETHMOIDECTOMY;  Surgeon: Leta Baptist, MD;  Location: [place];  Service: ENT;  Laterality: Bilateral;  . FRONTAL SINUS EXPLORATION Bilateral 06/18/2019   Procedure: FRONTAL SINUS EXPLORATION;  Surgeon: Leta Baptist, MD;  Location: [place];  Service: ENT;  Laterality: Bilateral;  . LAPAROSCOPIC SUPRACERVICAL HYSTERECTOMY  12/09/2011   Procedure: LAPAROSCOPIC SUPRACERVICAL HYSTERECTOMY;  Surgeon: Osborne Oman, MD;  Location: [facility];  Service: Gynecology;  Laterality: N/A;  . MAXILLARY ANTROSTOMY Bilateral 06/18/2019   Procedure: MAXILLARY ANTROSTOMY;  Surgeon: Leta Baptist, MD;  Location: [place];  Service: ENT;  Laterality: Bilateral;  . NASAL SINUS SURGERY  11/07  . SALPINGOOPHORECTOMY  12/09/2011   Procedure: SALPINGO OOPHERECTOMY;  Surgeon: Osborne Oman, MD;  Location: [place] [facility];  Service: Gynecology;  Laterality: Bilateral;  . SINUS ENDO WITH FUSION Bilateral 06/18/2019   Procedure: SINUS ENDO WITH FUSION;  Surgeon: Leta Baptist, MD;  Location: [place];  Service: ENT;  Laterality: Bilateral;  . SPHENOIDECTOMY Bilateral 06/18/2019   Procedure: SPHENOIDECTOMY WITH TISSUE REMOVAL;  Surgeon: Leta Baptist, MD;  Location: [place];  Service: ENT;  Laterality: Bilateral;  . SUPRACERVICAL ABDOMINAL HYSTERECTOMY  12/09/2011   Procedure: HYSTERECTOMY  SUPRACERVICAL ABDOMINAL;  Surgeon: Osborne Oman, MD;  Location: [facility];  Service: Gynecology;  Laterality: N/A;  . TUBAL LIGATION    . TURBINATE REDUCTION Bilateral 06/18/2019   Procedure: TURBINATE REDUCTION;  Surgeon: Leta Baptist, MD;  Location: [place];  Service: ENT;  Laterality: Bilateral;    Prior to Admission medications   Medication Sig Start Date End Date Taking? Authorizing Provider  amLODipine (NORVASC) 5 MG tablet TAKE 1 TABLET BY MOUTH EVERY DAY 05/05/19   Jerrol Banana., MD  azelastine (ASTELIN) 0.1 % nasal spray 2 sprays in each nostril daily until symptoms resolve 08/27/18   Maury Dus, NP  cyclobenzaprine (FLEXERIL) 5 MG tablet Take 1 tablet (5 mg total) by mouth 3 (three) times daily as needed for muscle spasms. 01/26/18   Ratcliffe, Heather R, PA-C  fexofenadine (ALLEGRA) 180 MG tablet Take 1 tablet (180 mg total) by mouth daily. 08/27/18   Maury Dus, NP  fluticasone (FLONASE) 50 MCG/ACT nasal spray INHALE 2 SPRAYS IN EACH NOSTRIL ONCE DAILY AS NEEDED 08/31/18   Jerrol Banana., MD  metoprolol tartrate (LOPRESSOR) 25 MG tablet TAKE 1 TABLET BY MOUTH TWICE A DAY 08/26/18   Jerrol Banana., MD  Multiple Vitamin (MULTIVITAMIN) tablet Take 1 tablet by mouth daily.    [provider]  simvastatin (ZOCOR) 20 MG tablet TAKE 1 TABLET (20 MG TOTAL) BY MOUTH DAILY. 03/19/19   Jerrol Banana., MD    Allergies  Allergen Reactions  . Codeine Nausea And Vomiting    Family History  Problem Relation Age of Onset  . Hypertension Mother   . Cancer Mother 20       uterine  . Hypertension Father   . Heart disease Other        MI  . Diabetes Other   . Stroke Other   . Heart disease Brother        age 35 and 19  . Cancer Other        lung  . Breast cancer Neg Hx     Social History Social History   Tobacco Use  . Smoking status: Former Smoker    Packs/day: 0.25    Years: 24.00    Pack years: 6.00    Quit date: 07/01/2001    Years since quitting: 18.1  . Smokeless tobacco: Never Used  Substance Use Topics  . Alcohol use: No    Alcohol/week: 0.0 standard drinks  . Drug use: No    Review of Systems Constitutional: Negative for fever. Cardiovascular:  Negative for chest pain. Respiratory: Negative for shortness of breath. Gastrointestinal: Positive for mild abdominal discomfort.  Positive for constipation.  Negative for vomiting. Genitourinary: Negative for urinary compaints Musculoskeletal: Negative for musculoskeletal complaints Neurological: Negative for headache All other ROS negative  ____________________________________________   PHYSICAL EXAM:  VITAL SIGNS: ED Triage Vitals  Enc Vitals Group     BP 08/03/19 1846 (!) 165/80     Pulse Rate 08/03/19 1846 79     Resp 08/03/19 1846 16     Temp 08/03/19 1846 98.6 F (37 C)     Temp Source 08/03/19 1846 Oral     SpO2 08/03/19 1846 98 %     Weight 08/03/19 1843 203 lb (92.1 kg)     Height --      Head Circumference --      Peak Flow --      Pain Score 08/03/19 1843 0  Pain Loc --      Pain Edu? --      Excl. in [place]? --    Constitutional: Alert and oriented. Well appearing and in no distress. Eyes: Normal exam ENT      Head: Normocephalic and atraumatic.      Mouth/Throat: Mucous membranes are moist. Cardiovascular: Normal rate, regular rhythm.  Respiratory: Normal respiratory effort without tachypnea nor retractions. Breath sounds are clear  Gastrointestinal: Soft and nontender. No distention. Musculoskeletal: Nontender with normal range of motion in all extremities. Neurologic:  Normal speech and language. No gross focal neurologic deficits Skin:  Skin is warm, dry and intact.  Psychiatric: Mood and affect are normal.     INITIAL IMPRESSION / ASSESSMENT AND PLAN / ED COURSE  Pertinent labs & imaging results that were available during my care of the patient were reviewed by me and considered in my medical decision making (see chart for details).   Patient presents to the emergency department constipation and feeling hard stool stuck in her rectum that will not come out.  Patient's description is very consistent with fecal impaction.  We will attempt disimpaction  in the emergency department.  I discussed with the patient after disimpaction she will need to be on Colace twice daily to prevent further constipation as well as MiraLAX twice daily if she becomes constipated greater than 48 hours.  Patient agreeable to plan of care and will follow up with her doctor.  Patient has no significant stool in the rectal vault.  Offered an enema which the patient initially was agreeable to however has since decided against it.  We will discharge on an oral regimen of MiraLAX and Colace.  Patient agreeable to plan of care.  Barbara Kaiser was evaluated in Emergency Department on 08/03/2019 for the symptoms described in the history of present illness. She was evaluated in the context of the global COVID-19 pandemic, which necessitated consideration that the patient might be at risk for infection with the SARS-CoV-2 virus that causes COVID-19. Institutional protocols and algorithms that pertain to the evaluation of patients at risk for COVID-19 are in a state of rapid change based on information released by regulatory bodies including the CDC and federal and state organizations. These policies and algorithms were followed during the patient's care in the ED.  ____________________________________________   FINAL CLINICAL IMPRESSION(S) / ED DIAGNOSES  Fecal impaction Constipation   Harvest Dark, MD 08/03/19 2017

## 2019-08-03 NOTE — ED Notes (Signed)
Pt reports feeling constipated since her nasal polyp surgery in 12/20.  Pt using otc laxatives without relief.  Pt used a fleets enema 2 days ago.  Pt reports soreness in abd.  Pt alert  Speech clear.

## 2019-08-03 NOTE — Progress Notes (Signed)
Patient: Barbara Kaiser, Female    DOB: 12-14-1955, 64 y.o.   MRN: [id number] Visit Date: 08/05/2019  Today's Provider: [name], MD   Chief Complaint  Patient presents with  . Annual Exam   Subjective:     Annual physical exam Barbara Kaiser is a 64 y.o. female who presents today for health maintenance and complete physical. She feels well. She reports no regular exercising. She reports she is sleeping poorly. She will have a Pap smear next year with Dr. Glennon Mac from GYN. Mild recent constipation. -----------------------------------------------------------------   Colonoscopy: 01/28/2014 Mammogram: 06/29/2019 Pap: 08/17/2016-Abnormal referred to GYN  Review of Systems  Constitutional: Negative for chills, fatigue and fever.  HENT: Negative for congestion, ear pain, rhinorrhea, sneezing and sore throat.   Eyes: Negative.  Negative for pain and redness.  Respiratory: Negative for cough, shortness of breath and wheezing.   Cardiovascular: Negative for chest pain and leg swelling.  Gastrointestinal: Positive for constipation. Negative for abdominal pain, blood in stool, diarrhea and nausea.  Endocrine: Negative for polydipsia and polyphagia.  Genitourinary: Negative.  Negative for dysuria, flank pain, hematuria, pelvic pain, vaginal bleeding and vaginal discharge.  Musculoskeletal: Positive for back pain. Negative for arthralgias, gait problem and joint swelling.  Skin: Negative for rash.  Neurological: Negative.  Negative for dizziness, tremors, seizures, weakness, light-headedness, numbness and headaches.  Hematological: Negative for adenopathy.  Psychiatric/Behavioral: Negative.  Negative for behavioral problems, confusion and dysphoric mood. The patient is not nervous/anxious and is not hyperactive.     Social History      She  reports that she quit smoking about 18 years ago. She has a 6.00 pack-year smoking history. She has never used smokeless tobacco. She  reports that she does not drink alcohol or use drugs.       Social History   Socioeconomic History  . Marital status: Single    Spouse name: Not on file  . Number of children: 2  . Years of education: Not on file  . Highest education level: Not on file  Occupational History    Employer: [employer]  Tobacco Use  . Smoking status: Former Smoker    Packs/day: 0.25    Years: 24.00    Pack years: 6.00    Quit date: 07/01/2001    Years since quitting: 18.1  . Smokeless tobacco: Never Used  Substance and Sexual Activity  . Alcohol use: No    Alcohol/week: 0.0 standard drinks  . Drug use: No  . Sexual activity: Not Currently    Partners: Male    Birth control/protection: Surgical    Comment: tubalization.  Other Topics Concern  . Not on file  Social History Narrative   Treadmill for exercise   Social Determinants of Health   Financial Resource Strain:   . Difficulty of Paying Living Expenses: Not on file  Food Insecurity:   . Worried About Charity fundraiser in the Last Year: Not on file  . Ran Out of Food in the Last Year: Not on file  Transportation Needs:   . Lack of Transportation (Medical): Not on file  . Lack of Transportation (Non-Medical): Not on file  Physical Activity:   . Days of Exercise per Week: Not on file  . Minutes of Exercise per Session: Not on file  Stress:   . Feeling of Stress : Not on file  Social Connections:   . Frequency of Communication with Friends and Family: Not on file  .  Frequency of Social Gatherings with Friends and Family: Not on file  . Attends Religious Services: Not on file  . Active Member of Clubs or Organizations: Not on file  . Attends Archivist Meetings: Not on file  . Marital Status: Not on file    Past Medical History:  Diagnosis Date  . Allergic rhinitis   . Anemia, iron deficiency   . Colon polyps 3/10  . Complication of anesthesia    hard to wake  . Hypertension   . PONV (postoperative nausea and  vomiting)   . Uterine fibroid    with heavy menses and anemia (resolved by ablation)     Patient Active Problem List   Diagnosis Date Noted  . Impacted cerumen 02/12/2017  . Encounter for routine gynecological examination 01/11/2015  . Acute sinusitis 07/26/2014  . Benign paroxysmal positional vertigo 07/26/2014  . Personal history of colonic polyps 12/06/2013  . Obesity 12/06/2013  . Routine general medical examination at a health care facility 08/16/2012  . CAD (coronary artery disease) 12/05/2011  . Preoperative cardiovascular examination 12/05/2011  . Hyperglycemia 08/20/2011  . Vitamin D deficiency 08/20/2011  . Hyperlipidemia 06/21/2009  . HYPERTENSION, MILD 02/13/2007  . ALLERGIC RHINITIS 02/13/2007  . ASTHMA 02/13/2007    Past Surgical History:  Procedure Laterality Date  . ABDOMINAL HYSTERECTOMY    . CARDIAC CATHETERIZATION  05/2005   mild CAD  . DILATION AND CURETTAGE OF UTERUS  12/08   hysteroscopy and endometrial ablation  . ETHMOIDECTOMY Bilateral 06/18/2019   Procedure: ETHMOIDECTOMY;  Surgeon: Leta Baptist, MD;  Location: [place];  Service: ENT;  Laterality: Bilateral;  . FRONTAL SINUS EXPLORATION Bilateral 06/18/2019   Procedure: FRONTAL SINUS EXPLORATION;  Surgeon: Leta Baptist, MD;  Location: [employer];  Service: ENT;  Laterality: Bilateral;  . LAPAROSCOPIC SUPRACERVICAL HYSTERECTOMY  12/09/2011   Procedure: LAPAROSCOPIC SUPRACERVICAL HYSTERECTOMY;  Surgeon: Osborne Oman, MD;  Location: [facility];  Service: Gynecology;  Laterality: N/A;  . MAXILLARY ANTROSTOMY Bilateral 06/18/2019   Procedure: MAXILLARY ANTROSTOMY;  Surgeon: Leta Baptist, MD;  Location: [place];  Service: ENT;  Laterality: Bilateral;  . NASAL SINUS SURGERY  11/07  . SALPINGOOPHORECTOMY  12/09/2011   Procedure: SALPINGO OOPHERECTOMY;  Surgeon: Osborne Oman, MD;  Location: [facility];  Service: Gynecology;  Laterality: Bilateral;  . SINUS ENDO WITH  FUSION Bilateral 06/18/2019   Procedure: SINUS ENDO WITH FUSION;  Surgeon: Leta Baptist, MD;  Location: [place];  Service: ENT;  Laterality: Bilateral;  . SPHENOIDECTOMY Bilateral 06/18/2019   Procedure: SPHENOIDECTOMY WITH TISSUE REMOVAL;  Surgeon: Leta Baptist, MD;  Location: [place];  Service: ENT;  Laterality: Bilateral;  . SUPRACERVICAL ABDOMINAL HYSTERECTOMY  12/09/2011   Procedure: HYSTERECTOMY SUPRACERVICAL ABDOMINAL;  Surgeon: Osborne Oman, MD;  Location: [facility];  Service: Gynecology;  Laterality: N/A;  . TUBAL LIGATION    . TURBINATE REDUCTION Bilateral 06/18/2019   Procedure: TURBINATE REDUCTION;  Surgeon: Leta Baptist, MD;  Location: [place];  Service: ENT;  Laterality: Bilateral;    Family History        Family Status  Relation Name Status  . Mother  Alive  . Father  Deceased       died of cirrhosis  . Other Grandmother Alive  . Brother  Alive  . Other Grandfather Alive  . Neg Hx  (Not Specified)        Her family history includes Cancer in an other  family member; Cancer (age of onset: 36) in her mother; Diabetes in an other family member; Heart disease in her brother and another family member; Hypertension in her father and mother; Stroke in an other family member. There is no history of Breast cancer.      Allergies  Allergen Reactions  . Codeine Nausea And Vomiting     Current Outpatient Medications:  .  amLODipine (NORVASC) 5 MG tablet, TAKE 1 TABLET BY MOUTH EVERY DAY, Disp: 90 tablet, Rfl: 1 .  azelastine (ASTELIN) 0.1 % nasal spray, 2 sprays in each nostril daily until symptoms resolve, Disp: 30 mL, Rfl: 12 .  cyclobenzaprine (FLEXERIL) 5 MG tablet, Take 1 tablet (5 mg total) by mouth 3 (three) times daily as needed for muscle spasms., Disp: 20 tablet, Rfl: 0 .  docusate sodium (COLACE) 100 MG capsule, Take 1 capsule (100 mg total) by mouth 2 (two) times daily., Disp: 60 capsule, Rfl: 2 .  fexofenadine (ALLEGRA) 180  MG tablet, Take 1 tablet (180 mg total) by mouth daily., Disp: 30 tablet, Rfl: 0 .  fluticasone (FLONASE) 50 MCG/ACT nasal spray, INHALE 2 SPRAYS IN EACH NOSTRIL ONCE DAILY AS NEEDED, Disp: 16 g, Rfl: 11 .  metoprolol tartrate (LOPRESSOR) 25 MG tablet, TAKE 1 TABLET BY MOUTH TWICE A DAY, Disp: 60 tablet, Rfl: 11 .  Multiple Vitamin (MULTIVITAMIN) tablet, Take 1 tablet by mouth daily., Disp: , Rfl:  .  polyethylene glycol powder (GLYCOLAX/MIRALAX) 17 GM/SCOOP powder, Take 17 g by mouth daily as needed for moderate constipation., Disp: 255 g, Rfl: 0 .  simvastatin (ZOCOR) 20 MG tablet, TAKE 1 TABLET (20 MG TOTAL) BY MOUTH DAILY., Disp: 90 tablet, Rfl: 2   Patient Care Team: Jerrol Banana., MD as PCP - General (Family Medicine)    Objective:    Vitals: BP 132/82 (BP Location: Right Arm, Patient Position: Sitting, Cuff Size: Large)   Pulse (!) 59   Temp (!) 96.9 F (36.1 C) (Temporal)   Resp 16   Ht 5\' 7"  (1.702 m)   Wt 208 lb (94.3 kg)   LMP 11/28/2011   SpO2 95% Comment: room air  BMI 32.58 kg/m    Vitals:   08/05/19 0954  BP: 132/82  Pulse: (!) 59  Resp: 16  Temp: (!) 96.9 F (36.1 C)  TempSrc: Temporal  SpO2: 95%  Weight: 208 lb (94.3 kg)  Height: 5\' 7"  (1.702 m)     Physical Exam Vitals reviewed.  Constitutional:      Appearance: Normal appearance. She is well-developed. She is obese.  HENT:     Head: Normocephalic and atraumatic.     Comments: He has upper dentures and a partial lower denture.    Right Ear: External ear normal.     Left Ear: External ear normal.     Nose: Nose normal.  Eyes:     General: No scleral icterus.    Conjunctiva/sclera: Conjunctivae normal.     Pupils: Pupils are equal, round, and reactive to light.  Neck:     Thyroid: No thyromegaly.     Trachea: No tracheal deviation.     Comments: Chronic pulsatile area  in the right lower neck. Cardiovascular:     Rate and Rhythm: Normal rate and regular rhythm.     Heart sounds:  Normal heart sounds. No murmur. No gallop.   Pulmonary:     Effort: Pulmonary effort is normal. No respiratory distress.     Breath sounds: Normal breath sounds. No  wheezing.  Chest:     Breasts:        Right: No inverted nipple, mass or nipple discharge.        Left: No inverted nipple, mass or nipple discharge.  Abdominal:     General: There is no distension.     Palpations: Abdomen is soft.     Tenderness: There is no abdominal tenderness.  Genitourinary:    Labia:        Right: No rash or lesion.        Left: No rash or lesion.      Vagina: Normal. No bleeding.     Rectum: Guaiac result negative.  Musculoskeletal:        General: No tenderness.     Cervical back: Normal range of motion and neck supple.  Skin:    General: Skin is warm and dry.     Findings: No erythema or rash.  Neurological:     General: No focal deficit present.     Mental Status: She is alert and oriented to person, place, and time. Mental status is at baseline.  Psychiatric:        Mood and Affect: Mood normal.        Behavior: Behavior normal.        Thought Content: Thought content normal.        Judgment: Judgment normal.      Depression Screen PHQ 2/9 Scores 08/05/2019 08/04/2018 06/16/2017 06/06/2016  PHQ - 2 Score 0 0 0 0  PHQ- 9 Score 1 0 0 -       Assessment & Plan:     Routine Health Maintenance and Physical Exam  Exercise Activities and Dietary recommendations Goals   None     Immunization History  Administered Date(s) Administered  . Influenza,inj,Quad PF,6+ Mos 04/20/2019  . Influenza-Unspecified 03/31/2013, 03/31/2014  . Td 03/21/2006    Health Maintenance  Topic Date Due  . TETANUS/TDAP  03/21/2016  . COLONOSCOPY  01/29/2019  . PAP SMEAR-Modifier  06/16/2020  . MAMMOGRAM  06/28/2020  . INFLUENZA VACCINE  Completed  . Hepatitis C Screening  Completed  . HIV Screening  Completed     Discussed health benefits of physical activity, and encouraged her to engage in  regular exercise appropriate for her age and condition.    -------------------------------------------------------------------- 1. Annual physical exam  - CBC with Differential/Platelet - Comprehensive metabolic panel - TSH - Lipid panel  2. Need for Tdap vaccination  - Tdap vaccine greater than or equal to 7yo IM  3. Essential hypertension, benign On amlodipine, metoprolol - CBC with Differential/Platelet - Comprehensive metabolic panel - TSH - Lipid panel  4. Vitamin D deficiency  - CBC with Differential/Platelet - Comprehensive metabolic panel - TSH - Lipid panel  5. Other hyperlipidemia On simvastatin 20 - CBC with Differential/Platelet - Comprehensive metabolic panel - TSH - Lipid panel  6. Constipation, unspecified constipation type Patient complains of chronic Constipation.  Try Linzess.  She has only been on Colace and GlycoLax for a couple of days.  May just need more time for these to work.  Please see work-up from ED from a few days ago. - linaclotide (LINZESS) 145 MCG CAPS capsule; Take 1 capsule (145 mcg total) by mouth daily before breakfast.  Dispense: 30 capsule; Refill: 11    I,Roshena L Chambers,acting as a scribe for [name], MD.,have documented all relevant documentation on the behalf of [name], MD,as directed by  [name],  MD while in the presence of [name], MD.    [name], MD  [facility]

## 2019-08-05 ENCOUNTER — Encounter: Payer: Self-pay | Admitting: Family Medicine

## 2019-08-05 ENCOUNTER — Other Ambulatory Visit: Payer: Self-pay

## 2019-08-05 ENCOUNTER — Ambulatory Visit (INDEPENDENT_AMBULATORY_CARE_PROVIDER_SITE_OTHER): Payer: BC Managed Care – PPO | Admitting: Family Medicine

## 2019-08-05 VITALS — BP 132/82 | HR 59 | Temp 96.9°F | Resp 16 | Ht 67.0 in | Wt 208.0 lb

## 2019-08-05 DIAGNOSIS — Z0000 Encounter for general adult medical examination without abnormal findings: Secondary | ICD-10-CM | POA: Diagnosis not present

## 2019-08-05 DIAGNOSIS — E559 Vitamin D deficiency, unspecified: Secondary | ICD-10-CM | POA: Diagnosis not present

## 2019-08-05 DIAGNOSIS — K5900 Constipation, unspecified: Secondary | ICD-10-CM | POA: Diagnosis not present

## 2019-08-05 DIAGNOSIS — E7849 Other hyperlipidemia: Secondary | ICD-10-CM

## 2019-08-05 DIAGNOSIS — Z23 Encounter for immunization: Secondary | ICD-10-CM

## 2019-08-05 DIAGNOSIS — I10 Essential (primary) hypertension: Secondary | ICD-10-CM | POA: Diagnosis not present

## 2019-08-05 MED ORDER — LINACLOTIDE 145 MCG PO CAPS: 145.0000 ug | ORAL_CAPSULE | Freq: Every day | ORAL | 11 refills | Status: DC

## 2019-08-05 NOTE — Patient Instructions (Signed)
Take 1 capsule of Linzess daily!!

## 2019-08-12 ENCOUNTER — Other Ambulatory Visit: Payer: Self-pay

## 2019-08-12 DIAGNOSIS — J324 Chronic pansinusitis: Secondary | ICD-10-CM | POA: Diagnosis not present

## 2019-08-12 DIAGNOSIS — J338 Other polyp of sinus: Secondary | ICD-10-CM | POA: Diagnosis not present

## 2019-08-13 ENCOUNTER — Other Ambulatory Visit: Payer: Self-pay | Admitting: Family Medicine

## 2019-08-17 ENCOUNTER — Other Ambulatory Visit: Payer: Self-pay

## 2019-08-17 ENCOUNTER — Telehealth: Payer: Self-pay

## 2019-08-17 DIAGNOSIS — Z0000 Encounter for general adult medical examination without abnormal findings: Secondary | ICD-10-CM

## 2019-08-17 NOTE — Telephone Encounter (Signed)
Copied from [place] [id number]. Topic: General - Inquiry >> Aug 17, 2019  1:10 PM Rainey Pines A wrote: Patient stated that insurance is needing prior authorization on linaclotide (LINZESS) 145 MCG CAPS capsule . Patient would like a callback once prior authorization is complete.

## 2019-08-18 LAB — COMPREHENSIVE METABOLIC PANEL
ALT: 13 IU/L (ref 0–32)
AST: 15 IU/L (ref 0–40)
Albumin/Globulin Ratio: 1.6 (ref 1.2–2.2)
Albumin: 4.1 g/dL (ref 3.8–4.8)
Alkaline Phosphatase: 115 IU/L (ref 39–117)
BUN/Creatinine Ratio: 16 (ref 12–28)
BUN: 13 mg/dL (ref 8–27)
Bilirubin Total: 0.3 mg/dL (ref 0.0–1.2)
CO2: 21 mmol/L (ref 20–29)
Calcium: 9.1 mg/dL (ref 8.7–10.3)
Chloride: 109 mmol/L — ABNORMAL HIGH (ref 96–106)
Creatinine, Ser: 0.8 mg/dL (ref 0.57–1.00)
GFR calc Af Amer: 92 mL/min/{1.73_m2} (ref >59)
GFR calc non Af Amer: 80 mL/min/{1.73_m2} (ref >59)
Globulin, Total: 2.5 g/dL (ref 1.5–4.5)
Glucose: 105 mg/dL — ABNORMAL HIGH (ref 65–99)
Potassium: 4.3 mmol/L (ref 3.5–5.2)
Sodium: 144 mmol/L (ref 134–144)
Total Protein: 6.6 g/dL (ref 6.0–8.5)

## 2019-08-18 LAB — CBC WITH DIFFERENTIAL/PLATELET
Basophils Absolute: 0.1 10*3/uL (ref 0.0–0.2)
Basos: 1 %
EOS (ABSOLUTE): 0.2 10*3/uL (ref 0.0–0.4)
Eos: 4 %
Hematocrit: 43.2 % (ref 34.0–46.6)
Hemoglobin: 13.8 g/dL (ref 11.1–15.9)
Immature Grans (Abs): 0 10*3/uL (ref 0.0–0.1)
Immature Granulocytes: 0 %
Lymphocytes Absolute: 2 10*3/uL (ref 0.7–3.1)
Lymphs: 33 %
MCH: 27.9 pg (ref 26.6–33.0)
MCHC: 31.9 g/dL (ref 31.5–35.7)
MCV: 87 fL (ref 79–97)
Monocytes Absolute: 0.4 10*3/uL (ref 0.1–0.9)
Monocytes: 6 %
Neutrophils Absolute: 3.6 10*3/uL (ref 1.4–7.0)
Neutrophils: 56 %
Platelets: 253 10*3/uL (ref 150–450)
RBC: 4.95 x10E6/uL (ref 3.77–5.28)
RDW: 12.4 % (ref 11.7–15.4)
WBC: 6.3 10*3/uL (ref 3.4–10.8)

## 2019-08-18 LAB — LIPID PANEL WITH LDL/HDL RATIO
Cholesterol, Total: 160 mg/dL (ref 100–199)
HDL: 38 mg/dL — ABNORMAL LOW (ref >39)
LDL Chol Calc (NIH): 103 mg/dL — ABNORMAL HIGH (ref 0–99)
LDL/HDL Ratio: 2.7 ratio (ref 0.0–3.2)
Triglycerides: 104 mg/dL (ref 0–149)
VLDL Cholesterol Cal: 19 mg/dL (ref 5–40)

## 2019-08-18 LAB — TSH: TSH: 2.02 u[IU]/mL (ref 0.450–4.500)

## 2019-08-18 NOTE — Telephone Encounter (Signed)
PA was started.

## 2019-08-20 ENCOUNTER — Telehealth: Payer: Self-pay | Admitting: Family Medicine

## 2019-08-20 NOTE — Telephone Encounter (Signed)
If the samples I gave her helped her then she will need to come in to discuss this to be able to document for prior authorization.  Otherwise I would stay with a GlycoLax daily during the night and to give her a prior authorization for the information we have now.

## 2019-08-20 NOTE — Telephone Encounter (Signed)
Patient is calling to check on the status of the PA. She has not heard anything. Please advise Cb- [phone]

## 2019-08-20 NOTE — Telephone Encounter (Signed)
PA for Linzess was denied. Please advise?

## 2019-08-20 NOTE — Telephone Encounter (Signed)
Tried calling pt, line was busy. Will try again later.

## 2019-08-26 ENCOUNTER — Encounter: Payer: Self-pay | Admitting: Nurse Practitioner

## 2019-08-26 ENCOUNTER — Other Ambulatory Visit: Payer: Self-pay

## 2019-08-26 ENCOUNTER — Ambulatory Visit: Payer: Self-pay | Admitting: Nurse Practitioner

## 2019-08-26 VITALS — BP 131/84 | HR 65 | Temp 98.5°F | Resp 16 | Ht 67.0 in | Wt 202.0 lb

## 2019-08-26 DIAGNOSIS — B9789 Other viral agents as the cause of diseases classified elsewhere: Secondary | ICD-10-CM

## 2019-08-26 DIAGNOSIS — H6591 Unspecified nonsuppurative otitis media, right ear: Secondary | ICD-10-CM

## 2019-08-26 DIAGNOSIS — J302 Other seasonal allergic rhinitis: Secondary | ICD-10-CM

## 2019-08-26 MED ORDER — FEXOFENADINE HCL 180 MG PO TABS: 180.0000 mg | ORAL_TABLET | Freq: Every day | ORAL | 0 refills | Status: DC

## 2019-08-26 NOTE — Progress Notes (Signed)
   Subjective:    Patient ID: Barbara Kaiser, female    DOB: [birth year], 64 y.o.   MRN: [id number]  HPI Barbara Kaiser is here today with c/o left ear ringing x 2 weeks. She denies ear pain or decreased hearing. She reports she has a hx of this but also concerned of ear wax build up to her right ear. She denies any other respiratory symptoms or dizziness. Has used OTC ear wax gtts and reports she was able to get some wax out. Covid screening negative.    Review of Systems  Constitutional: Negative for fever.  HENT: Positive for tinnitus. Negative for congestion, postnasal drip, sinus pressure, sinus pain and sore throat.   Respiratory: Negative for shortness of breath.   Cardiovascular: Negative for chest pain.       Objective:   Physical Exam Constitutional:      Appearance: Normal appearance.  HENT:     Head: Normocephalic and atraumatic.     Comments: Left nonerythematous intact TM with clear serous bubbles. Right TM WNL with minimal cerumen to EAC    Right Ear: There is impacted cerumen.     Left Ear: There is no impacted cerumen.     Nose: Nose normal.     Mouth/Throat:     Mouth: Mucous membranes are moist.  Cardiovascular:     Rate and Rhythm: Normal rate and regular rhythm.  Pulmonary:     Effort: Pulmonary effort is normal.     Breath sounds: Normal breath sounds.  Musculoskeletal:     Cervical back: Neck supple.  Skin:    General: Skin is warm and dry.  Neurological:     Mental Status: She is alert and oriented to person, place, and time.  Psychiatric:        Mood and Affect: Mood normal.           Assessment & Plan:

## 2019-08-26 NOTE — Telephone Encounter (Signed)
Patient is calling to check on the status of PA. Can April call her and give her the below message? CB- [phone]

## 2019-08-26 NOTE — Telephone Encounter (Signed)
Correct CB-  [phone]

## 2019-08-26 NOTE — Patient Instructions (Signed)
Start Fexofenadine to see if it will help dry the fluid in your left ear to stop the ringing. Encouraged patient to call the office or primary care doctor for an appointment if no improvement in symptoms or if symptoms change or worsen after 72 hours of planned treatment. Patient verbalized understanding of all instructions given/reviewed and has no further questions or concerns at this time.

## 2019-08-30 NOTE — Telephone Encounter (Signed)
LMOVM for pt to cb. Patient has an appt scheduled for 09/02/2019.

## 2019-09-02 ENCOUNTER — Other Ambulatory Visit: Payer: Self-pay

## 2019-09-02 ENCOUNTER — Encounter: Payer: Self-pay | Admitting: Family Medicine

## 2019-09-02 ENCOUNTER — Ambulatory Visit (INDEPENDENT_AMBULATORY_CARE_PROVIDER_SITE_OTHER): Payer: BC Managed Care – PPO | Admitting: Family Medicine

## 2019-09-02 DIAGNOSIS — K5900 Constipation, unspecified: Secondary | ICD-10-CM | POA: Diagnosis not present

## 2019-09-02 MED ORDER — LINACLOTIDE 145 MCG PO CAPS: 145.0000 ug | ORAL_CAPSULE | Freq: Every day | ORAL | 11 refills | Status: DC

## 2019-09-02 NOTE — Progress Notes (Signed)
Patient: Barbara Kaiser Female    DOB: [birth year]   64 y.o.   MRN: [id number] Visit Date: 09/02/2019  Today's Provider: [name], MD   Chief Complaint  Patient presents with  . Follow-up  . Constipation   Subjective:     HPI  Patient works for [employer].  She has had her first Covid vaccine.  We gave her samples of Linzess on her last visit and she got good relief from her constipation.  She has failed over-the-counter laxatives, Colace, Metamucil, and GlycoLax.  Took each of them  for at least a week. Constipation, unspecified constipation type From 08/05/2019-Patient complains of chronic Constipation.  Try Linzess.  She has only been on Colace and GlycoLax for a couple of days.  May just need more time for these to work.  Please see work-up from ED from a few days ago.    Allergies  Allergen Reactions  . Codeine Nausea And Vomiting     Current Outpatient Medications:  .  amLODipine (NORVASC) 5 MG tablet, TAKE 1 TABLET BY MOUTH EVERY DAY, Disp: 90 tablet, Rfl: 1 .  azelastine (ASTELIN) 0.1 % nasal spray, 2 sprays in each nostril daily until symptoms resolve, Disp: 30 mL, Rfl: 12 .  cyclobenzaprine (FLEXERIL) 5 MG tablet, Take 1 tablet (5 mg total) by mouth 3 (three) times daily as needed for muscle spasms., Disp: 20 tablet, Rfl: 0 .  docusate sodium (COLACE) 100 MG capsule, Take 1 capsule (100 mg total) by mouth 2 (two) times daily., Disp: 60 capsule, Rfl: 2 .  fexofenadine (ALLEGRA) 180 MG tablet, Take 1 tablet (180 mg total) by mouth daily., Disp: 30 tablet, Rfl: 0 .  fluticasone (FLONASE) 50 MCG/ACT nasal spray, INHALE 2 SPRAYS IN EACH NOSTRIL ONCE DAILY AS NEEDED, Disp: 16 g, Rfl: 11 .  metoprolol tartrate (LOPRESSOR) 25 MG tablet, TAKE 1 TABLET BY MOUTH TWICE A DAY, Disp: 180 tablet, Rfl: 3 .  Multiple Vitamin (MULTIVITAMIN) tablet, Take 1 tablet by mouth daily., Disp: , Rfl:  .  simvastatin (ZOCOR) 20 MG tablet, TAKE 1 TABLET (20 MG TOTAL) BY MOUTH  DAILY., Disp: 90 tablet, Rfl: 2 .  linaclotide (LINZESS) 145 MCG CAPS capsule, Take 1 capsule (145 mcg total) by mouth daily before breakfast. (Patient not taking: Reported on 09/02/2019), Disp: 30 capsule, Rfl: 11 .  polyethylene glycol powder (GLYCOLAX/MIRALAX) 17 GM/SCOOP powder, Take 17 g by mouth daily as needed for moderate constipation. (Patient not taking: Reported on 09/02/2019), Disp: 255 g, Rfl: 0  Review of Systems  Constitutional: Negative for appetite change, chills, fatigue and fever.  Eyes: Negative.   Respiratory: Negative for chest tightness and shortness of breath.   Cardiovascular: Negative for chest pain and palpitations.  Gastrointestinal: Positive for constipation. Negative for abdominal pain, nausea and vomiting.  Endocrine: Negative.   Allergic/Immunologic: Negative.   Neurological: Negative for dizziness and weakness.  Psychiatric/Behavioral: Negative.     Social History   Tobacco Use  . Smoking status: Former Smoker    Packs/day: 0.25    Years: 24.00    Pack years: 6.00    Quit date: 07/01/2001    Years since quitting: 18.1  . Smokeless tobacco: Never Used  Substance Use Topics  . Alcohol use: No    Alcohol/week: 0.0 standard drinks      Objective:   BP 139/81 (BP Location: Left Arm, Patient Position: Sitting, Cuff Size: Large)   Pulse 74   Temp (!) 97.1 F (  36.2 C) (Other (Comment))   Resp 16   Ht 5\' 7"  (1.702 m)   Wt 205 lb (93 kg)   LMP 11/28/2011   SpO2 96%   BMI 32.11 kg/m  Vitals:   09/02/19 1540  BP: 139/81  Pulse: 74  Resp: 16  Temp: (!) 97.1 F (36.2 C)  TempSrc: Other (Comment)  SpO2: 96%  Weight: 205 lb (93 kg)  Height: 5\' 7"  (1.702 m)  Body mass index is 32.11 kg/m.   Physical Exam Vitals reviewed.  Constitutional:      Appearance: Normal appearance. She is well-developed. She is obese.  HENT:     Head: Normocephalic and atraumatic.     Comments: He has upper dentures and a partial lower denture.    Right Ear: External  ear normal.     Left Ear: External ear normal.     Nose: Nose normal.  Eyes:     General: No scleral icterus.    Conjunctiva/sclera: Conjunctivae normal.     Pupils: Pupils are equal, round, and reactive to light.  Neck:     Thyroid: No thyromegaly.     Trachea: No tracheal deviation.     Comments: Chronic pulsatile area  in the right lower neck. Cardiovascular:     Rate and Rhythm: Normal rate and regular rhythm.     Heart sounds: Normal heart sounds. No murmur. No gallop.   Pulmonary:     Effort: Pulmonary effort is normal. No respiratory distress.     Breath sounds: Normal breath sounds. No wheezing.  Chest:     Breasts:        Right: No inverted nipple, mass or nipple discharge.        Left: No inverted nipple, mass or nipple discharge.  Abdominal:     General: There is no distension.     Palpations: Abdomen is soft.     Tenderness: There is no abdominal tenderness.  Genitourinary:    Labia:        Right: No rash or lesion.        Left: No rash or lesion.      Vagina: Normal. No bleeding.     Rectum: Guaiac result negative.  Musculoskeletal:        General: No tenderness.     Cervical back: Normal range of motion and neck supple.  Skin:    General: Skin is warm and dry.     Findings: No erythema or rash.  Neurological:     Mental Status: She is alert and oriented to person, place, and time.  Psychiatric:        Mood and Affect: Mood normal.        Behavior: Behavior normal.        Thought Content: Thought content normal.        Judgment: Judgment normal.      No results found for any visits on 09/02/19.     Assessment & Plan    1. Constipation, unspecified constipation type Patient has failed over-the-counter Metamucil, Colace, GlycoLax.  She took each for over a week.  She got quick relief with Linzess samples.  Linzess not covered by insurance.  We will try for prior authorization. - linaclotide (LINZESS) 145 MCG CAPS capsule; Take 1 capsule (145 mcg  total) by mouth daily before breakfast.  Dispense: 30 capsule; Refill: 11    Follow up in February for CPE.    I,Lanaiya Lantry,acting as a scribe for [name], MD.,have documented  all relevant documentation on the behalf of [name], MD,as directed by  [name], MD while in the presence of [name], MD.     [name], MD  [facility]

## 2019-09-05 ENCOUNTER — Ambulatory Visit: Payer: BC Managed Care – PPO | Attending: Internal Medicine

## 2019-09-05 DIAGNOSIS — Z23 Encounter for immunization: Secondary | ICD-10-CM | POA: Insufficient documentation

## 2019-09-05 NOTE — Progress Notes (Signed)
   Covid-19 Vaccination Clinic  Name:  Chiana Macartney    MRN: [id number] DOB: [birth year]  09/05/2019  Ms. Pouncey was observed post Covid-19 immunization for 15 minutes without incident. She was provided with Vaccine Information Sheet and instruction to access the V-Safe system.   Ms. Dobbs was instructed to call 911 with any severe reactions post vaccine: Marland Kitchen Difficulty breathing  . Swelling of face and throat  . A fast heartbeat  . A bad rash all over body  . Dizziness and weakness   Immunizations Administered    Name Date Dose VIS Date Route   Pfizer COVID-19 Vaccine 09/05/2019  2:41 PM 0.3 mL 06/11/2019 Intramuscular   Manufacturer: [place]   Lot: [id number]   [place]: [id number]

## 2019-09-15 DIAGNOSIS — Z8371 Family history of colonic polyps: Secondary | ICD-10-CM | POA: Diagnosis not present

## 2019-09-15 DIAGNOSIS — M7061 Trochanteric bursitis, right hip: Secondary | ICD-10-CM | POA: Diagnosis not present

## 2019-09-15 DIAGNOSIS — R112 Nausea with vomiting, unspecified: Secondary | ICD-10-CM | POA: Diagnosis not present

## 2019-09-15 DIAGNOSIS — Z86010 Personal history of colonic polyps: Secondary | ICD-10-CM | POA: Diagnosis not present

## 2019-09-15 DIAGNOSIS — K5909 Other constipation: Secondary | ICD-10-CM | POA: Diagnosis not present

## 2019-09-28 ENCOUNTER — Ambulatory Visit: Payer: BC Managed Care – PPO | Attending: Internal Medicine

## 2019-09-28 DIAGNOSIS — Z23 Encounter for immunization: Secondary | ICD-10-CM

## 2019-09-28 NOTE — Progress Notes (Signed)
   Covid-19 Vaccination Clinic  Name:  Barbara Kaiser    MRN: [id number] DOB: 05/11/1956  09/28/2019  [name] was observed post Covid-19 immunization for 15 minutes without incident. She was provided with Vaccine Information Sheet and instruction to access the V-Safe system.   [name] was instructed to call 911 with any severe reactions post vaccine: Marland Kitchen Difficulty breathing  . Swelling of face and throat  . A fast heartbeat  . A bad rash all over body  . Dizziness and weakness   Immunizations Administered    Name Date Dose VIS Date Route   Pfizer COVID-19 Vaccine 09/28/2019  3:15 PM 0.3 mL 06/11/2019 Intramuscular   Manufacturer: [place]   Lot: [id number]   [place]: [id number]

## 2019-11-01 ENCOUNTER — Ambulatory Visit: Payer: Self-pay | Admitting: Medical

## 2019-11-10 ENCOUNTER — Other Ambulatory Visit: Payer: Self-pay | Admitting: Family Medicine

## 2019-11-10 MED ORDER — FLUTICASONE PROPIONATE 50 MCG/ACT NA SUSP: NASAL | 8 refills | Status: DC

## 2019-11-10 NOTE — Telephone Encounter (Signed)
Requested Prescriptions  Pending Prescriptions Disp Refills  . fluticasone (FLONASE) 50 MCG/ACT nasal spray 16 g 8    Sig: INHALE 2 SPRAYS IN EACH NOSTRIL ONCE DAILY AS NEEDED     Ear, Nose, and Throat: Nasal Preparations - Corticosteroids Passed - 11/10/2019  3:39 PM      Passed - Valid encounter within last 12 months    Recent Outpatient Visits          2 months ago Constipation, unspecified constipation type   [facility] Jerrol Banana., MD   3 months ago Annual physical exam   [facility] Jerrol Banana., MD   8 months ago Acute recurrent maxillary sinusitis   [facility] Jerrol Banana., MD   11 months ago HYPERTENSION, [place], Vickki Muff, [place]   1 year ago Annual physical exam   [facility] Jerrol Banana., MD

## 2019-11-10 NOTE — Telephone Encounter (Signed)
Pt request refill fluticasone (FLONASE) 50 MCG/ACT nasal spray   [id number] [place], [place] - [place] Phone:  [phone]  Fax:  [phone]

## 2019-11-24 DIAGNOSIS — Z01818 Encounter for other preprocedural examination: Secondary | ICD-10-CM | POA: Diagnosis not present

## 2019-11-26 DIAGNOSIS — Z86010 Personal history of colonic polyps: Secondary | ICD-10-CM | POA: Diagnosis not present

## 2019-11-26 DIAGNOSIS — K640 First degree hemorrhoids: Secondary | ICD-10-CM | POA: Diagnosis not present

## 2020-01-25 DIAGNOSIS — M47816 Spondylosis without myelopathy or radiculopathy, lumbar region: Secondary | ICD-10-CM | POA: Diagnosis not present

## 2020-01-25 DIAGNOSIS — M5136 Other intervertebral disc degeneration, lumbar region: Secondary | ICD-10-CM | POA: Diagnosis not present

## 2020-01-25 DIAGNOSIS — M4316 Spondylolisthesis, lumbar region: Secondary | ICD-10-CM | POA: Diagnosis not present

## 2020-01-25 DIAGNOSIS — M5416 Radiculopathy, lumbar region: Secondary | ICD-10-CM | POA: Diagnosis not present

## 2020-01-25 DIAGNOSIS — M2578 Osteophyte, vertebrae: Secondary | ICD-10-CM | POA: Diagnosis not present

## 2020-02-11 DIAGNOSIS — M48062 Spinal stenosis, lumbar region with neurogenic claudication: Secondary | ICD-10-CM | POA: Diagnosis not present

## 2020-02-11 DIAGNOSIS — M5416 Radiculopathy, lumbar region: Secondary | ICD-10-CM | POA: Diagnosis not present

## 2020-02-11 DIAGNOSIS — M5136 Other intervertebral disc degeneration, lumbar region: Secondary | ICD-10-CM | POA: Diagnosis not present

## 2020-03-17 ENCOUNTER — Other Ambulatory Visit: Payer: Self-pay | Admitting: Physical Medicine and Rehabilitation

## 2020-03-17 DIAGNOSIS — G8929 Other chronic pain: Secondary | ICD-10-CM

## 2020-03-17 DIAGNOSIS — M5441 Lumbago with sciatica, right side: Secondary | ICD-10-CM | POA: Diagnosis not present

## 2020-03-17 DIAGNOSIS — M5416 Radiculopathy, lumbar region: Secondary | ICD-10-CM | POA: Diagnosis not present

## 2020-03-17 DIAGNOSIS — M5136 Other intervertebral disc degeneration, lumbar region: Secondary | ICD-10-CM | POA: Diagnosis not present

## 2020-04-03 ENCOUNTER — Other Ambulatory Visit: Payer: Self-pay

## 2020-04-03 ENCOUNTER — Ambulatory Visit
Admission: RE | Admit: 2020-04-03 | Discharge: 2020-04-03 | Disposition: A | Discharge status: Home / Self Care | Payer: BC Managed Care – PPO | Source: Ambulatory Visit | Attending: Physical Medicine and Rehabilitation | Admitting: Physical Medicine and Rehabilitation

## 2020-04-03 DIAGNOSIS — G8929 Other chronic pain: Secondary | ICD-10-CM | POA: Diagnosis not present

## 2020-04-03 DIAGNOSIS — M5441 Lumbago with sciatica, right side: Secondary | ICD-10-CM | POA: Insufficient documentation

## 2020-04-03 DIAGNOSIS — M545 Low back pain, unspecified: Secondary | ICD-10-CM | POA: Diagnosis not present

## 2020-04-17 DIAGNOSIS — M48062 Spinal stenosis, lumbar region with neurogenic claudication: Secondary | ICD-10-CM | POA: Diagnosis not present

## 2020-04-17 DIAGNOSIS — M5136 Other intervertebral disc degeneration, lumbar region: Secondary | ICD-10-CM | POA: Diagnosis not present

## 2020-04-17 DIAGNOSIS — M5416 Radiculopathy, lumbar region: Secondary | ICD-10-CM | POA: Diagnosis not present

## 2020-04-17 DIAGNOSIS — M5441 Lumbago with sciatica, right side: Secondary | ICD-10-CM | POA: Diagnosis not present

## 2020-04-28 ENCOUNTER — Ambulatory Visit: Payer: BC Managed Care – PPO | Admitting: Adult Health

## 2020-04-28 ENCOUNTER — Ambulatory Visit
Admission: RE | Admit: 2020-04-28 | Discharge: 2020-04-28 | Disposition: A | Discharge status: Home / Self Care | Payer: BC Managed Care – PPO | Source: Ambulatory Visit | Attending: Adult Health | Admitting: Adult Health

## 2020-04-28 ENCOUNTER — Other Ambulatory Visit: Payer: Self-pay | Admitting: Adult Health

## 2020-04-28 ENCOUNTER — Other Ambulatory Visit: Payer: Self-pay

## 2020-04-28 ENCOUNTER — Encounter: Payer: Self-pay | Admitting: Adult Health

## 2020-04-28 ENCOUNTER — Ambulatory Visit: Payer: Self-pay

## 2020-04-28 VITALS — BP 153/70 | HR 71 | Temp 98.2°F | Resp 16 | Wt 202.0 lb

## 2020-04-28 DIAGNOSIS — M79605 Pain in left leg: Secondary | ICD-10-CM | POA: Diagnosis not present

## 2020-04-28 DIAGNOSIS — Z23 Encounter for immunization: Secondary | ICD-10-CM

## 2020-04-28 DIAGNOSIS — M79662 Pain in left lower leg: Secondary | ICD-10-CM | POA: Insufficient documentation

## 2020-04-28 DIAGNOSIS — M7989 Other specified soft tissue disorders: Secondary | ICD-10-CM | POA: Diagnosis not present

## 2020-04-28 DIAGNOSIS — L03116 Cellulitis of left lower limb: Secondary | ICD-10-CM

## 2020-04-28 MED ORDER — CEPHALEXIN 500 MG PO CAPS: 500.0000 mg | ORAL_CAPSULE | Freq: Three times a day (TID) | ORAL | 0 refills | Status: DC

## 2020-04-28 NOTE — Progress Notes (Addendum)
Established patient visit   Patient: Barbara Kaiser   DOB: September 10, 1955   64 y.o. Female  MRN: [id number] Visit Date: 04/28/2020  Today's healthcare provider: Marcille Buffy, FNP   Chief Complaint  Patient presents with  . Ankle Pain   Subjective    Leg Pain  The incident occurred 3 to 5 days ago. The incident occurred at home. There was no injury mechanism. The pain is present in the left leg. The pain has been constant since onset. Pertinent negatives include no inability to bear weight, loss of motion, loss of sensation, muscle weakness, numbness or tingling. She reports no foreign bodies present. Nothing (touching the area ) aggravates the symptoms. She has tried nothing for the symptoms. The treatment provided no relief.   She reports she had the Covid Booster in her left arm on Monday 04/24/20 and she  she reports incidentally that  evening she went home she noticed her left lower leg  getting warm on and off.  She has noticed warmness in lower leg in one area that seems to go and come. No tenderness with walking but does have with touching area or drainage.   She did have an area a few weeks ago near her left ankle that she scratched, thinking it was a tick, but must have been a scab she scraped it off.  No swelling or pain, or erythema in ankle .  Denies any injuries or trauma.  Denies any previous history of DVT. Former smoker. No recent surgeries. No recent flights.  Not on anticoagulants .  No new medications.  No injury.  [phone] is her cell.    Patient  denies any fever,chills, rash, chest pain, shortness of breath, nausea, vomiting, or diarrhea.  Denies any chest pain or tightness.   Patient Active Problem List   Diagnosis Date Noted  . Impacted cerumen 02/12/2017  . Encounter for routine gynecological examination 01/11/2015  . Acute sinusitis 07/26/2014  . Benign paroxysmal positional vertigo 07/26/2014  . Personal history of colonic polyps  12/06/2013  . Obesity 12/06/2013  . Routine general medical examination at a health care facility 08/16/2012  . CAD (coronary artery disease) 12/05/2011  . Preoperative cardiovascular examination 12/05/2011  . Hyperglycemia 08/20/2011  . Vitamin D deficiency 08/20/2011  . Hyperlipidemia 06/21/2009  . HYPERTENSION, MILD 02/13/2007  . ALLERGIC RHINITIS 02/13/2007  . ASTHMA 02/13/2007   Past Medical History:  Diagnosis Date  . Allergic rhinitis   . Anemia, iron deficiency   . Colon polyps 3/10  . Complication of anesthesia    hard to wake  . Hypertension   . PONV (postoperative nausea and vomiting)   . Uterine fibroid    with heavy menses and anemia (resolved by ablation)   Past Surgical History:  Procedure Laterality Date  . ABDOMINAL HYSTERECTOMY    . CARDIAC CATHETERIZATION  05/2005   mild CAD  . DILATION AND CURETTAGE OF UTERUS  12/08   hysteroscopy and endometrial ablation  . ETHMOIDECTOMY Bilateral 06/18/2019   Procedure: ETHMOIDECTOMY;  Surgeon: Leta Baptist, MD;  Location: [place];  Service: ENT;  Laterality: Bilateral;  . FRONTAL SINUS EXPLORATION Bilateral 06/18/2019   Procedure: FRONTAL SINUS EXPLORATION;  Surgeon: Leta Baptist, MD;  Location: [place];  Service: ENT;  Laterality: Bilateral;  . LAPAROSCOPIC SUPRACERVICAL HYSTERECTOMY  12/09/2011   Procedure: LAPAROSCOPIC SUPRACERVICAL HYSTERECTOMY;  Surgeon: Osborne Oman, MD;  Location: [facility];  Service: Gynecology;  Laterality: N/A;  . MAXILLARY  ANTROSTOMY Bilateral 06/18/2019   Procedure: MAXILLARY ANTROSTOMY;  Surgeon: Leta Baptist, MD;  Location: [place];  Service: ENT;  Laterality: Bilateral;  . NASAL SINUS SURGERY  11/07  . SALPINGOOPHORECTOMY  12/09/2011   Procedure: SALPINGO OOPHERECTOMY;  Surgeon: Osborne Oman, MD;  Location: [facility];  Service: Gynecology;  Laterality: Bilateral;  . SINUS ENDO WITH FUSION Bilateral 06/18/2019   Procedure: SINUS ENDO WITH FUSION;   Surgeon: Leta Baptist, MD;  Location: [place];  Service: ENT;  Laterality: Bilateral;  . SPHENOIDECTOMY Bilateral 06/18/2019   Procedure: SPHENOIDECTOMY WITH TISSUE REMOVAL;  Surgeon: Leta Baptist, MD;  Location: [place];  Service: ENT;  Laterality: Bilateral;  . SUPRACERVICAL ABDOMINAL HYSTERECTOMY  12/09/2011   Procedure: HYSTERECTOMY SUPRACERVICAL ABDOMINAL;  Surgeon: Osborne Oman, MD;  Location: [facility];  Service: Gynecology;  Laterality: N/A;  . TUBAL LIGATION    . TURBINATE REDUCTION Bilateral 06/18/2019   Procedure: TURBINATE REDUCTION;  Surgeon: Leta Baptist, MD;  Location: [place];  Service: ENT;  Laterality: Bilateral;   Social History   Tobacco Use  . Smoking status: Former Smoker    Packs/day: 0.25    Years: 24.00    Pack years: 6.00    Quit date: 07/01/2001    Years since quitting: 18.8  . Smokeless tobacco: Never Used  Substance Use Topics  . Alcohol use: No    Alcohol/week: 0.0 standard drinks  . Drug use: No   Social History   Socioeconomic History  . Marital status: Single    Spouse name: Not on file  . Number of children: 2  . Years of education: Not on file  . Highest education level: Not on file  Occupational History    Employer: [employer]  Tobacco Use  . Smoking status: Former Smoker    Packs/day: 0.25    Years: 24.00    Pack years: 6.00    Quit date: 07/01/2001    Years since quitting: 18.8  . Smokeless tobacco: Never Used  Substance and Sexual Activity  . Alcohol use: No    Alcohol/week: 0.0 standard drinks  . Drug use: No  . Sexual activity: Not Currently    Partners: Male    Birth control/protection: Surgical    Comment: tubalization.  Other Topics Concern  . Not on file  Social History Narrative   Treadmill for exercise   Social Determinants of Health   Financial Resource Strain:   . Difficulty of Paying Living Expenses: Not on file  Food Insecurity:   . Worried About Sales executive in the Last Year: Not on file  . Ran Out of Food in the Last Year: Not on file  Transportation Needs:   . Lack of Transportation (Medical): Not on file  . Lack of Transportation (Non-Medical): Not on file  Physical Activity:   . Days of Exercise per Week: Not on file  . Minutes of Exercise per Session: Not on file  Stress:   . Feeling of Stress : Not on file  Social Connections:   . Frequency of Communication with Friends and Family: Not on file  . Frequency of Social Gatherings with Friends and Family: Not on file  . Attends Religious Services: Not on file  . Active Member of Clubs or Organizations: Not on file  . Attends Archivist Meetings: Not on file  . Marital Status: Not on file  Intimate Partner Violence:   . Fear of Current or  Ex-Partner: Not on file  . Emotionally Abused: Not on file  . Physically Abused: Not on file  . Sexually Abused: Not on file   Allergies  Allergen Reactions  . Codeine Nausea And Vomiting       Medications: Outpatient Medications Prior to Visit  Medication Sig  . amLODipine (NORVASC) 5 MG tablet TAKE 1 TABLET BY MOUTH EVERY DAY  . azelastine (ASTELIN) 0.1 % nasal spray 2 sprays in each nostril daily until symptoms resolve  . cyclobenzaprine (FLEXERIL) 5 MG tablet Take 1 tablet (5 mg total) by mouth 3 (three) times daily as needed for muscle spasms.  Marland Kitchen docusate sodium (COLACE) 100 MG capsule Take 1 capsule (100 mg total) by mouth 2 (two) times daily.  . fexofenadine (ALLEGRA) 180 MG tablet Take 1 tablet (180 mg total) by mouth daily.  . fluticasone (FLONASE) 50 MCG/ACT nasal spray INHALE 2 SPRAYS IN EACH NOSTRIL ONCE DAILY AS NEEDED  . linaclotide (LINZESS) 145 MCG CAPS capsule Take 1 capsule (145 mcg total) by mouth daily before breakfast.  . metoprolol tartrate (LOPRESSOR) 25 MG tablet TAKE 1 TABLET BY MOUTH TWICE A DAY  . Multiple Vitamin (MULTIVITAMIN) tablet Take 1 tablet by mouth daily.  . simvastatin (ZOCOR) 20 MG tablet  TAKE 1 TABLET (20 MG TOTAL) BY MOUTH DAILY.  Marland Kitchen polyethylene glycol powder (GLYCOLAX/MIRALAX) 17 GM/SCOOP powder Take 17 g by mouth daily as needed for moderate constipation. (Patient not taking: Reported on 09/02/2019)   No facility-administered medications prior to visit.    Review of Systems  Constitutional: Negative.   HENT: Negative.   Respiratory: Negative.   Cardiovascular: Negative.   Genitourinary: Negative.   Musculoskeletal: Negative.   Skin: Positive for color change.  Neurological: Negative.  Negative for tingling and numbness.  Psychiatric/Behavioral: Negative.     Last CBC Lab Results  Component Value Date   WBC 6.3 08/17/2019   HGB 13.8 08/17/2019   HCT 43.2 08/17/2019   MCV 87 08/17/2019   MCH 27.9 08/17/2019   RDW 12.4 08/17/2019   PLT 253 09/73/5329   Last metabolic panel Lab Results  Component Value Date   GLUCOSE 105 (H) 08/17/2019   NA 144 08/17/2019   K 4.3 08/17/2019   CL 109 (H) 08/17/2019   CO2 21 08/17/2019   BUN 13 08/17/2019   CREATININE 0.80 08/17/2019   GFRNONAA 80 08/17/2019   GFRAA 92 08/17/2019   CALCIUM 9.1 08/17/2019   PROT 6.6 08/17/2019   ALBUMIN 4.1 08/17/2019   LABGLOB 2.5 08/17/2019   AGRATIO 1.6 08/17/2019   BILITOT 0.3 08/17/2019   ALKPHOS 115 08/17/2019   AST 15 08/17/2019   ALT 13 08/17/2019   ANIONGAP 8 07/20/2014      Objective    BP (!) 153/70   Pulse 71   Temp 98.2 F (36.8 C)   Resp 16   Wt 202 lb (91.6 kg)   LMP 11/28/2011   BMI 31.64 kg/m  BP Readings from Last 3 Encounters:  04/28/20 (!) 153/70  09/02/19 139/81  08/26/19 131/84      Physical Exam Constitutional:      General: She is not in acute distress.    Appearance: Normal appearance. She is obese. She is not ill-appearing, toxic-appearing or diaphoretic.     Comments: Patient is alert and oriented and responsive to questions Engages in eye contact with provider. Speaks in full sentences without any pauses without any shortness of breath  or distress.    HENT:  Head: Normocephalic and atraumatic.     Right Ear: External ear normal.     Left Ear: External ear normal.     Mouth/Throat:     Mouth: Mucous membranes are moist.  Eyes:     Extraocular Movements: Extraocular movements intact.     Conjunctiva/sclera: Conjunctivae normal.     Pupils: Pupils are equal, round, and reactive to light.  Cardiovascular:     Rate and Rhythm: Normal rate and regular rhythm.     Pulses: Normal pulses.     Heart sounds: Normal heart sounds. No murmur heard.  No friction rub. No gallop.   Pulmonary:     Effort: Pulmonary effort is normal. No respiratory distress.     Breath sounds: Normal breath sounds. No stridor. No wheezing, rhonchi or rales.  Chest:     Chest wall: No tenderness.  Abdominal:     Palpations: Abdomen is soft.  Musculoskeletal:     Cervical back: Normal range of motion and neck supple.     Right ankle: Normal.     Right Achilles Tendon: Normal.     Left ankle: Normal.     Left Achilles Tendon: Normal.       Legs:     Comments: 2 + DP pulse bilaterally.   Skin:    General: Skin is warm.     Findings: Erythema present.  Neurological:     General: No focal deficit present.     Mental Status: She is alert and oriented to person, place, and time.     Motor: No weakness.     Gait: Gait normal.  Psychiatric:        Mood and Affect: Mood normal.        Behavior: Behavior normal.        Thought Content: Thought content normal.        Judgment: Judgment normal.      No results found for any visits on 04/28/20.  Assessment & Plan     Pain in left lower leg - Plan: US Venous Img Lower Bilateral (DVT), US Venous Img Lower Unilateral Left (DVT)  COVID-19 vaccine administered- 04/24/20   Will send for [place] rule out DVT given history.  She will go to medical mall now, will hold patient in order and call provider - radiology.    If DVT US negative will cover for cellulitis.   Red Flags discussed. The  patient was given clear instructions to go to ER or return to medical center if any red flags develop, symptoms do not improve, worsen or new problems develop. They verbalized understanding.  Return in about 3 days (around 05/01/2020), or if symptoms worsen or fail to improve, for at any time for any worsening symptoms, Go to Emergency room/ urgent care if worse.        Marcille Buffy, [place] [phone] (phone) [phone] (fax)  [place]

## 2020-04-28 NOTE — Progress Notes (Signed)
Called patient  04/28/20 lower extremity  left lower ultrasound  is negative for DVT.  Will send in keflex for possible early cellulitis, left lower extremity.   Red Flags discussed. The patient was given clear instructions to go to ER or return to medical center if any red flags develop, symptoms do not improve, worsen or new problems develop. They verbalized understanding.

## 2020-04-28 NOTE — Patient Instructions (Addendum)
Ruling out a  Deep Vein Thrombosis with ultrasound now.   Deep vein thrombosis (DVT) is a condition in which a blood clot forms in a deep vein, such as a lower leg, thigh, or arm vein. A clot is blood that has thickened into a gel or solid. This condition is dangerous. It can lead to serious and even life-threatening complications if the clot travels to the lungs and causes a blockage (pulmonary embolism). It can also damage veins in the leg. This can result in leg pain, swelling, discoloration, and sores (post-thrombotic syndrome). What are the causes? This condition may be caused by:  A slowdown of blood flow.  Damage to a vein.  A condition that causes blood to clot more easily, such as an inherited clotting disorder. What increases the risk? The following factors may make you more likely to develop this condition:  Being overweight.  Being older, especially over age 70.  Sitting or lying down for more than four hours.  Being in the hospital.  Lack of physical activity (sedentary lifestyle).  Pregnancy, being in childbirth, or having recently given birth.  Taking medicines that contain estrogen, such as medicines to prevent pregnancy.  Smoking.  A history of any of the following: ? Blood clots or a blood clotting disease. ? Peripheral vascular disease. ? Inflammatory bowel disease. ? Cancer. ? Heart disease. ? Genetic conditions that affect how your blood clots, such as Factor V Leiden mutation. ? Neurological diseases that affect your legs (leg paresis). ? A recent injury, such as a car accident. ? Major or lengthy surgery. ? A central line placed inside a large vein. What are the signs or symptoms? Symptoms of this condition include:  Swelling, pain, or tenderness in an arm or leg.  Warmth, redness, or discoloration in an arm or leg. If the clot is in your leg, symptoms may be more noticeable or worse when you stand or walk. Some people may not develop any  symptoms. How is this diagnosed? This condition is diagnosed with:  A medical history and physical exam.  Tests, such as: ? Blood tests. These are done to check how well your blood clots. ? Ultrasound. This is done to check for clots. ? Venogram. For this test, contrast dye is injected into a vein and X-rays are taken to check for any clots. How is this treated? Treatment for this condition depends on:  The cause of your DVT.  Your risk for bleeding or developing more clots.  Any other medical conditions that you have. Treatment may include:  Taking a blood thinner (anticoagulant). This type of medicine prevents clots from forming. It may be taken by mouth, injected under the skin, or injected through an IV (catheter).  Injecting clot-dissolving medicines into the affected vein (catheter-directed thrombolysis).  Having surgery. Surgery may be done to: ? Remove the clot. ? Place a filter in a large vein to catch blood clots before they reach the lungs. Some treatments may be continued for up to six months. Follow these instructions at home: If you are taking blood thinners:  Take the medicine exactly as told by your health care provider. Some blood thinners need to be taken at the same time every day. Do not skip a dose.  Talk with your health care provider before you take any medicines that contain aspirin or NSAIDs. These medicines increase your risk for dangerous bleeding.  Ask your health care provider about foods and drugs that could change the way the medicine  works (may interact). Avoid those things if your health care provider tells you to do so.  Blood thinners can cause easy bruising and may make it difficult to stop bleeding. Because of this: ? Be very careful when using knives, scissors, or other sharp objects. ? Use an electric razor instead of a blade. ? Avoid activities that could cause injury or bruising, and follow instructions about how to prevent  falls.  Wear a medical alert bracelet or carry a card that lists what medicines you take. General instructions  Take over-the-counter and prescription medicines only as told by your health care provider.  Return to your normal activities as told by your health care provider. Ask your health care provider what activities are safe for you.  Wear compression stockings if recommended by your health care provider.  Keep all follow-up visits as told by your health care provider. This is important. How is this prevented? To lower your risk of developing this condition again:  For 30 or more minutes every day, do an activity that: ? Involves moving your arms and legs. ? Increases your heart rate.  When traveling for longer than four hours: ? Exercise your arms and legs every hour. ? Drink plenty of water. ? Avoid drinking alcohol.  Avoid sitting or lying for a long time without moving your legs.  If you have surgery or you are hospitalized, ask about ways to prevent blood clots. These may include taking frequent walks or using anticoagulants.  Stay at a healthy weight.  If you are a woman who is older than age 52, avoid unnecessary use of medicines that contain estrogen, such as some birth control pills.  Do not use any products that contain nicotine or tobacco, such as cigarettes and e-cigarettes. This is especially important if you take estrogen medicines. If you need help quitting, ask your health care provider. Contact a health care provider if:  You miss a dose of your blood thinner.  Your menstrual period is heavier than usual.  You have unusual bruising. Get help right away if:  You have: ? New or increased pain, swelling, or redness in an arm or leg. ? Numbness or tingling in an arm or leg. ? Shortness of breath. ? Chest pain. ? A rapid or irregular heartbeat. ? A severe headache or confusion. ? A cut that will not stop bleeding.  There is blood in your vomit,  stool, or urine.  You have a serious fall or accident, or you hit your head.  You feel light-headed or dizzy.  You cough up blood. These symptoms may represent a serious problem that is an emergency. Do not wait to see if the symptoms will go away. Get medical help right away. Call your local emergency services (911 in the [place] Do not drive yourself to the hospital. Summary  Deep vein thrombosis (DVT) is a condition in which a blood clot forms in a deep vein, such as a lower leg, thigh, or arm vein.  Symptoms can include swelling, warmth, pain, and redness in your leg or arm.  This condition may be treated with a blood thinner (anticoagulant medicine), medicine that is injected to dissolve blood clots,compression stockings, or surgery.  If you are prescribed blood thinners, take them exactly as told. This information is not intended to replace advice given to you by your health care provider. Make sure you discuss any questions you have with your health care provider. Document Revised: 05/30/2017 Document Reviewed: 11/15/2016 Elsevier Patient Education  [place]. Leg Cramps Leg cramps occur when one or more muscles tighten and you have no control over this tightening (involuntary muscle contraction). Muscle cramps can develop in any muscle, but the most common place is in the calf muscles of the leg. Those cramps can occur during exercise or when you are at rest. Leg cramps are painful, and they may last for a few seconds to a few minutes. Cramps may return several times before they finally stop. Usually, leg cramps are not caused by a serious medical problem. In many cases, the cause is not known. Some common causes include:  Excessive physical effort (overexertion), such as during intense exercise.  Overuse from repetitive motions, or doing the same thing over and over.  Staying in a certain position for a long period of time.  Improper preparation, form, or technique  while performing a sport or an activity.  Dehydration.  Injury.  Side effects of certain medicines.  Abnormally low levels of minerals in your blood (electrolytes), especially potassium and calcium. This could result from: ? Pregnancy. ? Taking diuretic medicines. Follow these instructions at home: Eating and drinking  Drink enough fluid to keep your urine pale yellow. Staying hydrated may help prevent cramps.  Eat a healthy diet that includes plenty of nutrients to help your muscles function. A healthy diet includes fruits and vegetables, lean protein, whole grains, and low-fat or nonfat dairy products. Managing pain, stiffness, and swelling      Try massaging, stretching, and relaxing the affected muscle. Do this for several minutes at a time.  If directed, put ice on areas that are sore or painful after a cramp: ? Put ice in a plastic bag. ? Place a towel between your skin and the bag. ? Leave the ice on for 20 minutes, 2-3 times a day.  If directed, apply heat to muscles that are tense or tight. Do this before you exercise, or as often as told by your health care provider. Use the heat source that your health care provider recommends, such as a moist heat pack or a heating pad. ? Place a towel between your skin and the heat source. ? Leave the heat on for 20-30 minutes. ? Remove the heat if your skin turns bright red. This is especially important if you are unable to feel pain, heat, or cold. You may have a greater risk of getting burned.  Try taking hot showers or baths to help relax tight muscles. General instructions  If you are having frequent leg cramps, avoid intense exercise for several days.  Take over-the-counter and prescription medicines only as told by your health care provider.  Keep all follow-up visits as told by your health care provider. This is important. Contact a health care provider if:  Your leg cramps get more severe or more frequent, or they do  not improve over time.  Your foot becomes cold, numb, or blue. Summary  Muscle cramps can develop in any muscle, but the most common place is in the calf muscles of the leg.  Leg cramps are painful, and they may last for a few seconds to a few minutes.  Usually, leg cramps are not caused by a serious medical problem. Often, the cause is not known.  Stay hydrated and take over-the-counter and prescription medicines only as told by your health care provider. This information is not intended to replace advice given to you by your health care provider. Make sure you discuss any questions you have  with your health care provider. Document Revised: 05/30/2017 Document Reviewed: 03/27/2017 Elsevier Patient Education  2020 [employer].

## 2020-04-28 NOTE — Progress Notes (Signed)
Meds ordered this encounter  Medications  . cephALEXin (KEFLEX) 500 MG capsule    Sig: Take 1 capsule (500 mg total) by mouth 3 (three) times daily.    Dispense:  30 capsule    Refill:  0    Please let patient know I meant to send in Keflex.

## 2020-04-28 NOTE — Telephone Encounter (Addendum)
Pt. Reports after COVID 19 booster shot she noticed a "warm sensation at the left ankle. I don't want to let it go incase something is wrong." "What if it's a blood clot?" No pain or swelling.Appointment made.  Reason for Disposition . Numbness in a leg or foot (i.e., loss of sensation)  Answer Assessment - Initial Assessment Questions 1. ONSET: "When did the pain start?"      Left ankle 2. LOCATION: "Where is the pain located?"      Ankle 3. PAIN: "How bad is the pain?"    (Scale 1-10; or mild, moderate, severe)   -  MILD (1-3): doesn't interfere with normal activities    -  MODERATE (4-7): interferes with normal activities (e.g., work or school) or awakens from sleep, limping    -  SEVERE (8-10): excruciating pain, unable to do any normal activities, unable to walk     No pain 4. WORK OR EXERCISE: "Has there been any recent work or exercise that involved this part of the body?"      No 5. CAUSE: "What do you think is causing the leg pain?"     Unsure 6. OTHER SYMPTOMS: "Do you have any other symptoms?" (e.g., chest pain, back pain, breathing difficulty, swelling, rash, fever, numbness, weakness)     Warm sensation 7. PREGNANCY: "Is there any chance you are pregnant?" "When was your last menstrual period?"     No  Protocols used: LEG PAIN-A-AH

## 2020-05-01 DIAGNOSIS — M545 Low back pain, unspecified: Secondary | ICD-10-CM | POA: Diagnosis not present

## 2020-05-06 IMAGING — CT CT MAXILLOFACIAL W/O CM
1 series · 15 of 30 positions shown, 19 images · non-contrast
Comparison: CTA of the head 07/20/2014.

CLINICAL DATA: Chronic sinusitis. Right-sided pressure. Known
left-sided polyp.

EXAM:
CT MAXILLOFACIAL WITHOUT CONTRAST
TECHNIQUE: Multidetector CT images of the paranasal sinuses were obtained using
the standard protocol without intravenous contrast.

[Series 4: soft tissue · axial · 0.37mm/px · z∈[-204,-70]mm · 15 of 146 slices shown, 19 images]
[im 6/146  brain]
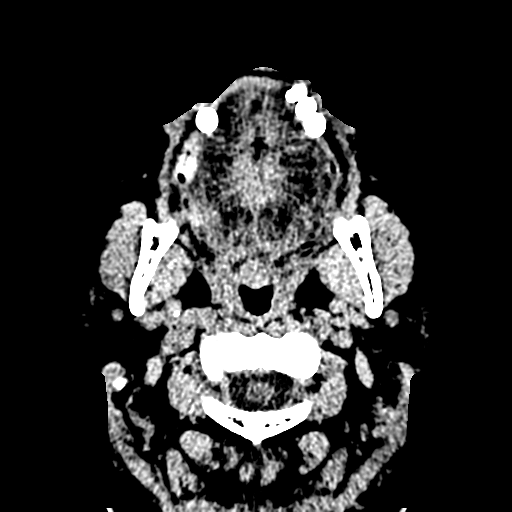
[im 6/146  bone]
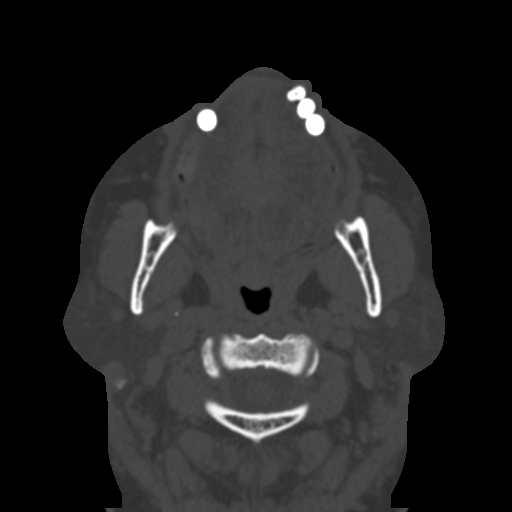
[im 16/146  bone]
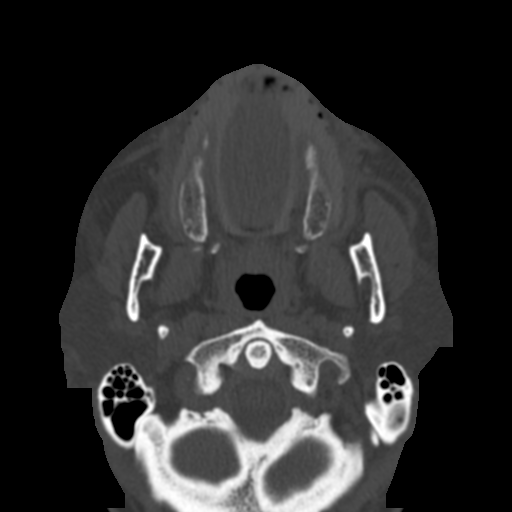
[im 26/146  bone]
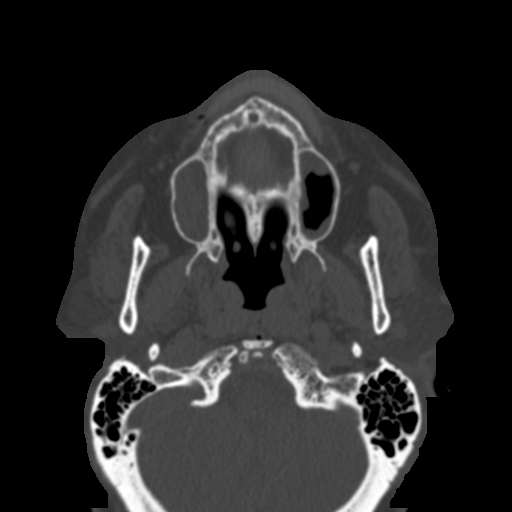
[im 36/146  bone]
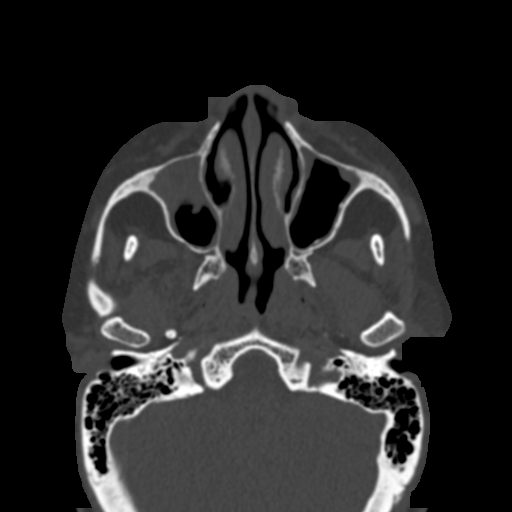
[im 46/146  brain]
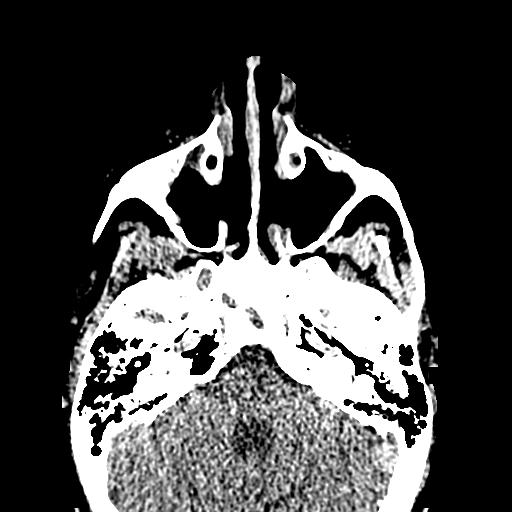
[im 46/146  bone]
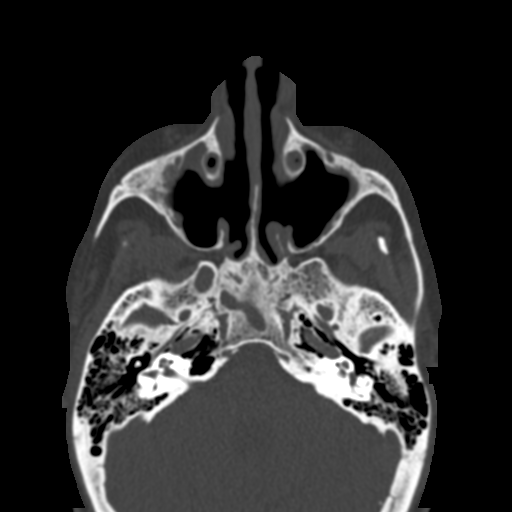
[im 56/146  bone]
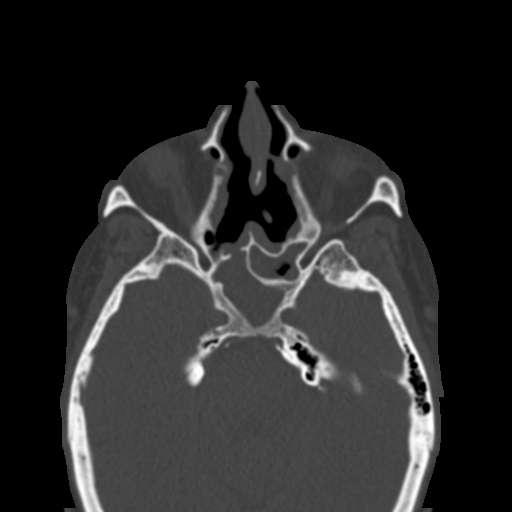
[im 66/146  bone]
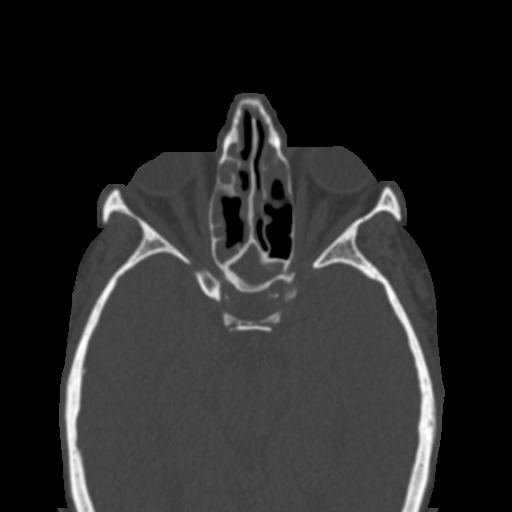
[im 76/146  bone]
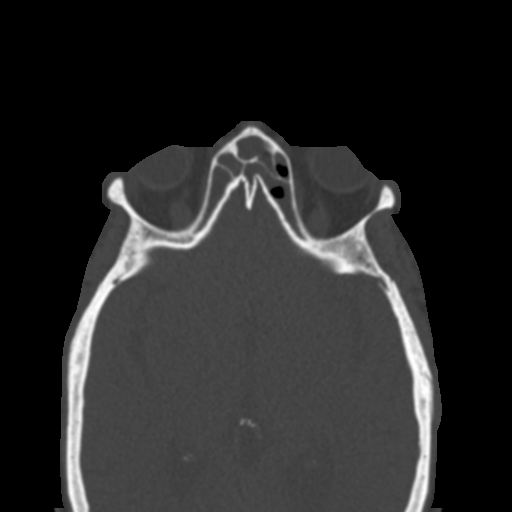
[im 81/146  brain]
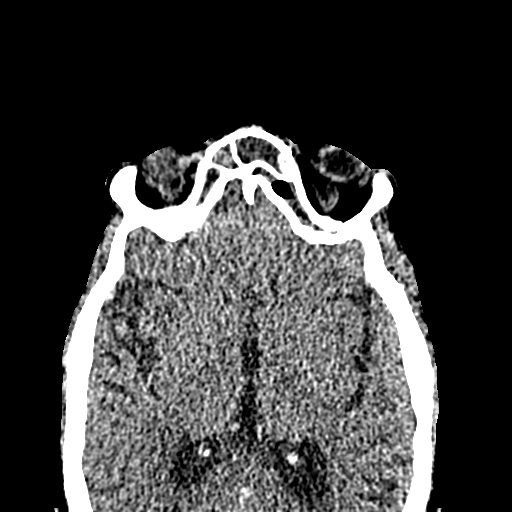
[im 81/146  bone]
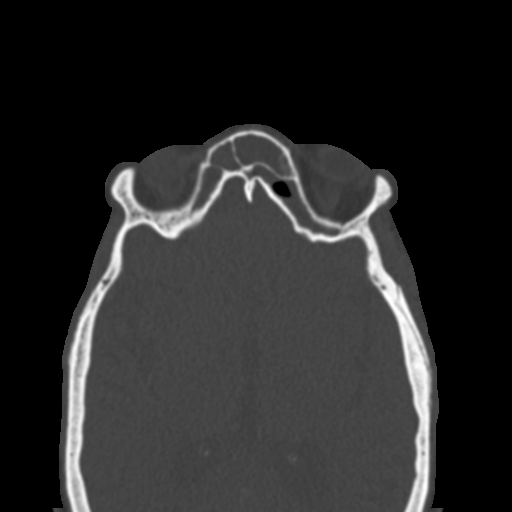
[im 91/146  bone]
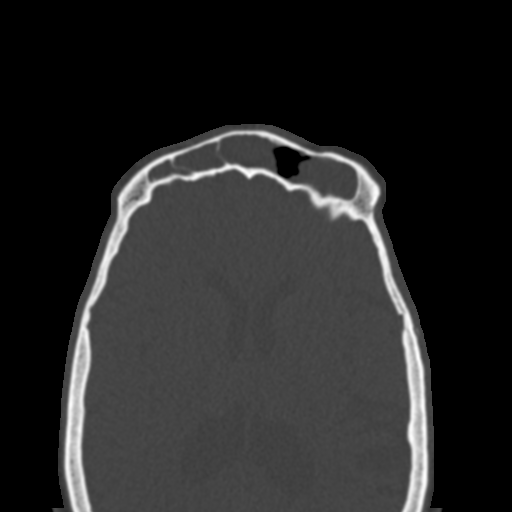
[im 101/146  bone]
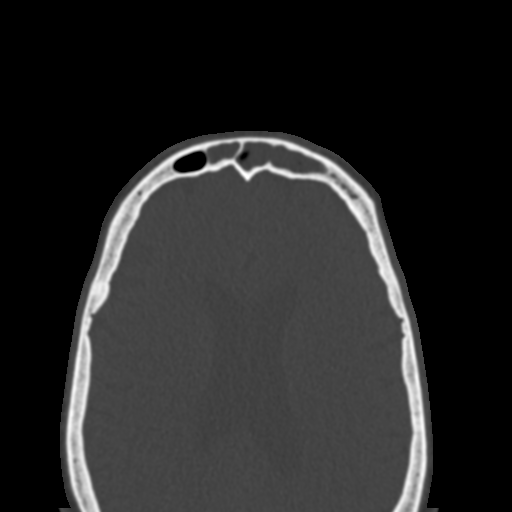
[im 111/146  bone]
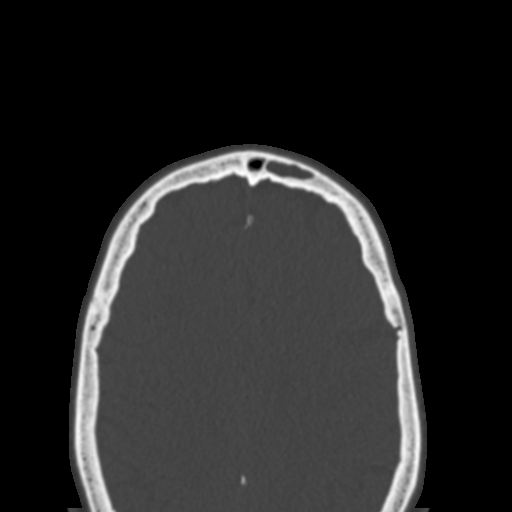
[im 121/146  brain]
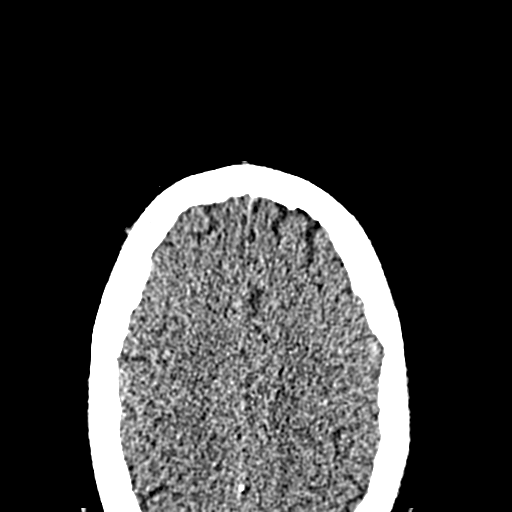
[im 121/146  bone]
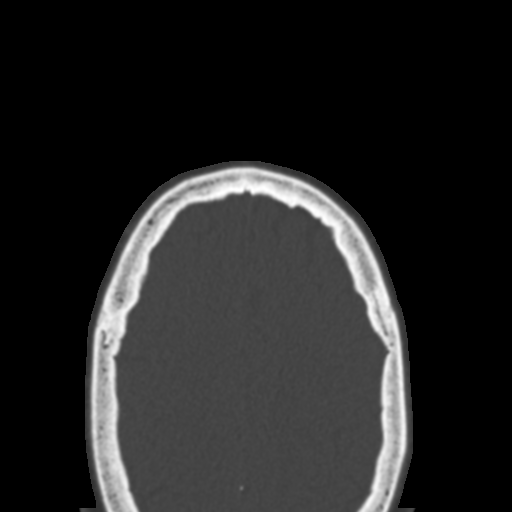
[im 131/146  bone]
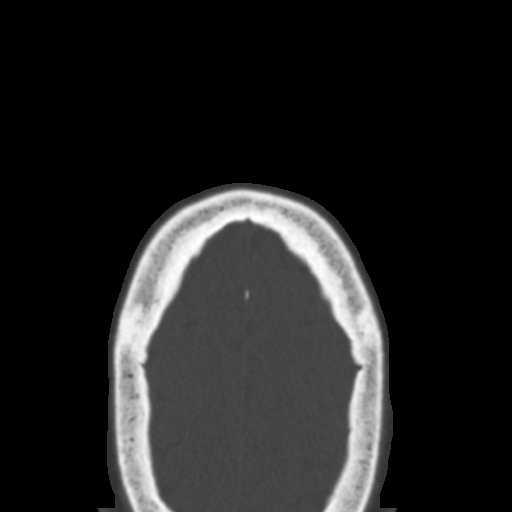
[im 141/146  bone]
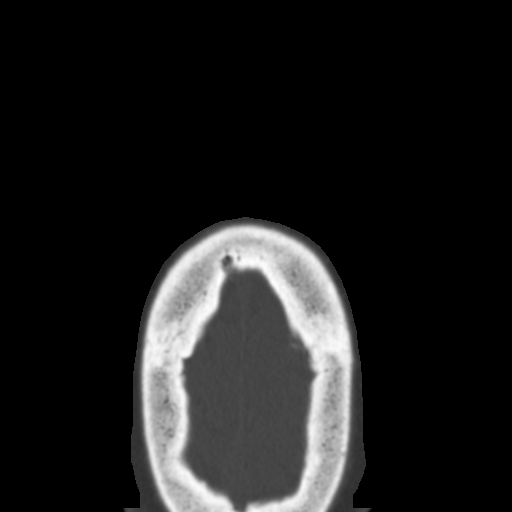

[15 of 30 positions shown; findings below may reference images not displayed]

FINDINGS: Paranasal sinuses:

Frontal: Extensive mucosal thickening is present in the maxillary
sinuses bilaterally. No fluid levels are present.

Ethmoid: Partial ethmoidectomies are present. Anterior ethmoid air
cells remain opacified.

Maxillary: Bilateral maxillary antrostomies are present. Middle
turbinates have been resected. Diffuse mucosal thickening remains in
the maxillary sinuses, right greater than left. There is no
obstruction.

Sphenoid: Sphenoid sinuses are opacified bilaterally. There is
marked wall thickening.

Right ostiomeatal unit: Maxillary antrostomy

Left ostiomeatal unit: Maxillary antrostomy

Nasal passages: A posterior nasal septal defect is present. Middle
and superior turbinates have been resected. Residual nasal septum is
midline.

Anatomy: Frontal sinus is pneumatized superior to the anterior
ethmoid notch bilaterally. Symmetric and intact olfactory grooves
and fovea ethmoidalis, Keros I (1-3mm). Sellar sphenoid
pneumatization pattern. No dehiscence of carotid or optic canals. No
onodi cell.

Other: Orbits and intracranial compartment are unremarkable. Visible
mastoid air cells are normally aerated.
IMPRESSION: 1. Bilateral maxillary antrostomies and partial ethmoidectomies.
2. Diffuse mucosal thickening throughout the remaining paranasal
sinuses without fluid levels.
3. Frontal sinuses, anterior ethmoid air cells, and sphenoid sinuses
are near completely opacified.
4. Posterior nasal septal defect. This is likely related to chronic
infection as it was present on the previous study.

## 2020-05-12 ENCOUNTER — Other Ambulatory Visit: Payer: Self-pay

## 2020-05-12 ENCOUNTER — Telehealth: Payer: Self-pay | Admitting: Medical

## 2020-05-12 NOTE — Progress Notes (Signed)
Patient called due to her swallowing a [employer] candy whole, by accident. She was sucking on the candy and it slid down into her throat. She can feel in on the left side. She is able to swallow her own saliva and has no problems breathing or talking. She wanted to know what to do. I recommended she continue to drink small amounts of water to help dissolve the candy. She is to call back in  2 hours to let me know how she is doing. If patient cannot breath/ has shortness of breath or trouble swallowing she is to seek out medical care immediately. Barbara Kaiser verbalizes understanding and has no questions at the end of our conversation.

## 2020-05-17 ENCOUNTER — Encounter: Payer: Self-pay | Admitting: Nurse Practitioner

## 2020-05-17 ENCOUNTER — Ambulatory Visit: Payer: BC Managed Care – PPO | Admitting: Nurse Practitioner

## 2020-05-17 ENCOUNTER — Other Ambulatory Visit: Payer: Self-pay

## 2020-05-17 VITALS — BP 148/78 | HR 74 | Temp 98.3°F | Resp 16

## 2020-05-17 DIAGNOSIS — H65111 Acute and subacute allergic otitis media (mucoid) (sanguinous) (serous), right ear: Secondary | ICD-10-CM

## 2020-05-17 DIAGNOSIS — J0100 Acute maxillary sinusitis, unspecified: Secondary | ICD-10-CM

## 2020-05-17 MED ORDER — SALINE SPRAY 0.65 % NA SOLN: 2.0000 | NASAL | 2 refills | Status: DC | PRN

## 2020-05-17 MED ORDER — AMOXICILLIN-POT CLAVULANATE 875-125 MG PO TABS: 1.0000 | ORAL_TABLET | Freq: Two times a day (BID) | ORAL | 0 refills | Status: AC

## 2020-05-17 NOTE — Progress Notes (Signed)
° °  Subjective:    Patient ID: Barbara Kaiser, female    DOB: 12/10/1955, 64 y.o.   MRN: [id number]  HPI  64 year old female here with complaints of right ear "fullness" and some sinus congestion. She gets a sinus infection typically each falll. Has allergies and does not keep up with her flonase daily.   Does not use OTC decongestants due to history of HTN.   Has had COVID-19 vaccinations and booster.   Denies any sick contacts, feels her symptoms are very similar to her allergies and sinuses she gets each fall. No fever or body aches.   Today's Vitals   05/17/20 1326  BP: (!) 148/78  Pulse: 74  Resp: 16  Temp: 98.3 F (36.8 C)  TempSrc: Oral  SpO2: 98%   There is no height or weight on file to calculate BMI.   Review of Systems  Constitutional: Negative.   HENT: Positive for congestion, ear pain and sinus pressure.   Eyes: Negative.   Respiratory: Negative.   Cardiovascular: Negative.   Gastrointestinal: Negative.   Neurological: Negative.        Objective:   Physical Exam Constitutional:      Appearance: Normal appearance.  HENT:     Head: Normocephalic.     Right Ear: Hearing normal. Swelling and tenderness present. A middle ear effusion is present. Tympanic membrane is erythematous.     Left Ear: Tympanic membrane, ear canal and external ear normal.     Nose: Mucosal edema, congestion and rhinorrhea present.     Right Turbinates: Enlarged.     Left Turbinates: Enlarged.     Right Sinus: Maxillary sinus tenderness present.  Eyes:     Pupils: Pupils are equal, round, and reactive to light.  Musculoskeletal:     Cervical back: Neck supple.  Neurological:     General: No focal deficit present.     Mental Status: She is alert and oriented to person, place, and time.  Psychiatric:        Mood and Affect: Mood normal.           Assessment & Plan:  Given history of recurrent sinusitis and inability to use aggressive decongestants will start on antibiotics  for AOM and sinusitis. Advised consistent use of flonase will also send script for ocean spray to help with drying of nasal passages from flonase.   RTC with new symptoms or with new concerns as discussed.   Meds ordered this encounter  Medications   amoxicillin-clavulanate (AUGMENTIN) 875-125 MG tablet    Sig: Take 1 tablet by mouth 2 (two) times daily for 7 days. Take with food    Dispense:  14 tablet    Refill:  0   sodium chloride (OCEAN) 0.65 % SOLN nasal spray    Sig: Place 2 sprays into both nostrils as needed for up to 14 days for congestion.    Dispense:  50 mL    Refill:  2

## 2020-06-26 ENCOUNTER — Other Ambulatory Visit: Payer: Self-pay | Admitting: Family Medicine

## 2020-06-26 DIAGNOSIS — E7849 Other hyperlipidemia: Secondary | ICD-10-CM

## 2020-08-11 ENCOUNTER — Other Ambulatory Visit: Payer: Self-pay | Admitting: Family Medicine

## 2020-08-11 ENCOUNTER — Ambulatory Visit: Payer: BC Managed Care – PPO | Admitting: Medical

## 2020-08-11 DIAGNOSIS — Z1231 Encounter for screening mammogram for malignant neoplasm of breast: Secondary | ICD-10-CM

## 2020-08-15 ENCOUNTER — Encounter: Payer: Self-pay | Admitting: Family Medicine

## 2020-08-15 ENCOUNTER — Ambulatory Visit (INDEPENDENT_AMBULATORY_CARE_PROVIDER_SITE_OTHER): Payer: BC Managed Care – PPO | Admitting: Family Medicine

## 2020-08-15 ENCOUNTER — Other Ambulatory Visit: Payer: Self-pay

## 2020-08-15 VITALS — BP 137/74 | HR 72 | Temp 98.4°F | Resp 16 | Ht 67.0 in | Wt 207.0 lb

## 2020-08-15 DIAGNOSIS — Z0000 Encounter for general adult medical examination without abnormal findings: Secondary | ICD-10-CM | POA: Diagnosis not present

## 2020-08-15 DIAGNOSIS — J309 Allergic rhinitis, unspecified: Secondary | ICD-10-CM

## 2020-08-15 DIAGNOSIS — I10 Essential (primary) hypertension: Secondary | ICD-10-CM | POA: Diagnosis not present

## 2020-08-15 DIAGNOSIS — Z1389 Encounter for screening for other disorder: Secondary | ICD-10-CM | POA: Diagnosis not present

## 2020-08-15 DIAGNOSIS — E7849 Other hyperlipidemia: Secondary | ICD-10-CM

## 2020-08-15 MED ORDER — CETIRIZINE HCL 10 MG PO TABS: 10.0000 mg | ORAL_TABLET | Freq: Every day | ORAL | 11 refills | Status: DC

## 2020-08-15 NOTE — Progress Notes (Signed)
I,Barbara Kaiser,acting as a scribe for Barbara Durie, MD.,have documented all relevant documentation on the behalf of Barbara Durie, MD,as directed by  Barbara Durie, MD while in the presence of Barbara Durie, MD.   Complete physical exam   Patient: Barbara Kaiser   DOB: [birth year]   65 y.o. Female  MRN: [id number] Visit Date: 08/15/2020  Today's healthcare provider: [name], MD   Chief Complaint  Patient presents with  . Annual Exam   Subjective    Barbara Kaiser is a 65 y.o. female who presents today for a complete physical exam.  She reports consuming a general diet. Home exercise routine includes walking. She generally feels well. She reports sleeping fairly well. She does not have additional problems to discuss today.  HPI  She has some mild sinus congestion and allergy symptoms recently. He is fully vaccinated against Covid.  Past Medical History:  Diagnosis Date  . Allergic rhinitis   . Anemia, iron deficiency   . Colon polyps 3/10  . Complication of anesthesia    hard to wake  . Hypertension   . PONV (postoperative nausea and vomiting)   . Uterine fibroid    with heavy menses and anemia (resolved by ablation)   Past Surgical History:  Procedure Laterality Date  . ABDOMINAL HYSTERECTOMY    . CARDIAC CATHETERIZATION  05/2005   mild CAD  . DILATION AND CURETTAGE OF UTERUS  12/08   hysteroscopy and endometrial ablation  . ETHMOIDECTOMY Bilateral 06/18/2019   Procedure: ETHMOIDECTOMY;  Surgeon: Leta Baptist, MD;  Location: [place];  Service: ENT;  Laterality: Bilateral;  . FRONTAL SINUS EXPLORATION Bilateral 06/18/2019   Procedure: FRONTAL SINUS EXPLORATION;  Surgeon: Leta Baptist, MD;  Location: [place];  Service: ENT;  Laterality: Bilateral;  . LAPAROSCOPIC SUPRACERVICAL HYSTERECTOMY  12/09/2011   Procedure: LAPAROSCOPIC SUPRACERVICAL HYSTERECTOMY;  Surgeon: Osborne Oman, MD;  Location: [facility];   Service: Gynecology;  Laterality: N/A;  . MAXILLARY ANTROSTOMY Bilateral 06/18/2019   Procedure: MAXILLARY ANTROSTOMY;  Surgeon: Leta Baptist, MD;  Location: [place];  Service: ENT;  Laterality: Bilateral;  . NASAL SINUS SURGERY  11/07  . SALPINGOOPHORECTOMY  12/09/2011   Procedure: SALPINGO OOPHERECTOMY;  Surgeon: Osborne Oman, MD;  Location: [facility];  Service: Gynecology;  Laterality: Bilateral;  . SINUS ENDO WITH FUSION Bilateral 06/18/2019   Procedure: SINUS ENDO WITH FUSION;  Surgeon: Leta Baptist, MD;  Location: [place];  Service: ENT;  Laterality: Bilateral;  . SPHENOIDECTOMY Bilateral 06/18/2019   Procedure: SPHENOIDECTOMY WITH TISSUE REMOVAL;  Surgeon: Leta Baptist, MD;  Location: [place];  Service: ENT;  Laterality: Bilateral;  . SUPRACERVICAL ABDOMINAL HYSTERECTOMY  12/09/2011   Procedure: HYSTERECTOMY SUPRACERVICAL ABDOMINAL;  Surgeon: Osborne Oman, MD;  Location: [facility];  Service: Gynecology;  Laterality: N/A;  . TUBAL LIGATION    . TURBINATE REDUCTION Bilateral 06/18/2019   Procedure: TURBINATE REDUCTION;  Surgeon: Leta Baptist, MD;  Location: [place];  Service: ENT;  Laterality: Bilateral;   Social History   Socioeconomic History  . Marital status: Single    Spouse name: Not on file  . Number of children: 2  . Years of education: Not on file  . Highest education level: Not on file  Occupational History    Employer: [employer]  Tobacco Use  . Smoking status: Former Smoker    Packs/day: 0.25    Years: 24.00  Pack years: 6.00    Quit date: 07/01/2001    Years since quitting: 19.1  . Smokeless tobacco: Never Used  Substance and Sexual Activity  . Alcohol use: No    Alcohol/week: 0.0 standard drinks  . Drug use: No  . Sexual activity: Not Currently    Partners: Male    Birth control/protection: Surgical    Comment: tubalization.  Other Topics Concern  . Not on file  Social History Narrative    Treadmill for exercise   Social Determinants of Health   Financial Resource Strain: Not on file  Food Insecurity: Not on file  Transportation Needs: Not on file  Physical Activity: Not on file  Stress: Not on file  Social Connections: Not on file  Intimate Partner Violence: Not on file   Family Status  Relation Name Status  . Mother  Alive  . Father  Deceased       died of cirrhosis  . Other Grandmother Alive  . Brother  Alive  . Other Grandfather Alive  . Neg Hx  (Not Specified)   Family History  Problem Relation Age of Onset  . Hypertension Mother   . Cancer Mother 20       uterine  . Hypertension Father   . Heart disease Other        MI  . Diabetes Other   . Stroke Other   . Heart disease Brother        age 45 and 33  . Cancer Other        lung  . Breast cancer Neg Hx    Allergies  Allergen Reactions  . Codeine Nausea And Vomiting    Patient Care Team: Jerrol Banana., MD as PCP - General (Family Medicine)   Medications: Outpatient Medications Prior to Visit  Medication Sig  . amLODipine (NORVASC) 5 MG tablet TAKE 1 TABLET BY MOUTH EVERY DAY  . azelastine (ASTELIN) 0.1 % nasal spray 2 sprays in each nostril daily until symptoms resolve  . fluticasone (FLONASE) 50 MCG/ACT nasal spray INHALE 2 SPRAYS IN EACH NOSTRIL ONCE DAILY AS NEEDED  . metoprolol tartrate (LOPRESSOR) 25 MG tablet TAKE 1 TABLET BY MOUTH TWICE A DAY  . Multiple Vitamin (MULTIVITAMIN) tablet Take 1 tablet by mouth daily.  . simvastatin (ZOCOR) 20 MG tablet TAKE 1 TABLET (20 MG TOTAL) BY MOUTH DAILY.  . fexofenadine (ALLEGRA) 180 MG tablet Take 1 tablet (180 mg total) by mouth daily. (Patient not taking: No sig reported)  . linaclotide (LINZESS) 145 MCG CAPS capsule Take 1 capsule (145 mcg total) by mouth daily before breakfast. (Patient not taking: No sig reported)  . sodium chloride (OCEAN) 0.65 % SOLN nasal spray Place 2 sprays into both nostrils as needed for up to 14 days for  congestion.   No facility-administered medications prior to visit.    Review of Systems  Musculoskeletal: Positive for back pain.  All other systems reviewed and are negative.      Objective    BP 137/74 (BP Location: Right Arm, Patient Position: Sitting, Cuff Size: Large)   Pulse 72   Temp 98.4 F (36.9 C) (Oral)   Resp 16   Ht 5\' 7"  (1.702 m)   Wt 207 lb (93.9 kg)   LMP 11/28/2011   SpO2 97%   BMI 32.42 kg/m  BP Readings from Last 3 Encounters:  08/15/20 137/74  05/17/20 (!) 148/78  04/28/20 (!) 153/70   Wt Readings from Last 3 Encounters:  08/15/20  207 lb (93.9 kg)  04/28/20 202 lb (91.6 kg)  09/02/19 205 lb (93 kg)      Physical Exam Vitals reviewed.  Constitutional:      Appearance: Normal appearance. She is well-developed. She is obese.  HENT:     Head: Normocephalic and atraumatic.     Comments: He has upper dentures and a partial lower denture.    Right Ear: External ear normal.     Left Ear: External ear normal.     Nose: Nose normal.  Eyes:     General: No scleral icterus.    Conjunctiva/sclera: Conjunctivae normal.     Pupils: Pupils are equal, round, and reactive to light.  Neck:     Thyroid: No thyromegaly.     Trachea: No tracheal deviation.     Comments: Chronic pulsatile area  in the right lower neck. Cardiovascular:     Rate and Rhythm: Normal rate and regular rhythm.     Heart sounds: Normal heart sounds. No murmur heard. No gallop.   Pulmonary:     Effort: Pulmonary effort is normal. No respiratory distress.     Breath sounds: Normal breath sounds. No wheezing.  Chest:  Breasts:     Right: No inverted nipple, mass or nipple discharge.     Left: No inverted nipple, mass or nipple discharge.    Abdominal:     General: There is no distension.     Palpations: Abdomen is soft.     Tenderness: There is no abdominal tenderness.  Genitourinary:    Labia:        Right: No rash or lesion.        Left: No rash or lesion.      Vagina:  Normal. No bleeding.     Rectum: Guaiac result negative.  Musculoskeletal:        General: No tenderness.     Cervical back: Normal range of motion and neck supple.  Skin:    General: Skin is warm and dry.     Findings: No erythema or rash.  Neurological:     General: No focal deficit present.     Mental Status: She is alert and oriented to person, place, and time. Mental status is at baseline.  Psychiatric:        Mood and Affect: Mood normal.        Behavior: Behavior normal.        Thought Content: Thought content normal.        Judgment: Judgment normal.       Last depression screening scores PHQ 2/9 Scores 08/15/2020 08/05/2019 08/04/2018  PHQ - 2 Score 0 0 0  PHQ- 9 Score 0 1 0   Last fall risk screening Fall Risk  08/04/2018  Falls in the past year? 0   Last Audit-C alcohol use screening Alcohol Use Disorder Test (AUDIT) 08/15/2020  1. How often do you have a drink containing alcohol? 0  2. How many drinks containing alcohol do you have on a typical day when you are drinking? 0  3. How often do you have six or more drinks on one occasion? 0  AUDIT-C Score 0  Alcohol Brief Interventions/Follow-up AUDIT Score <7 follow-up not indicated   A score of 3 or more in women, and 4 or more in men indicates increased risk for alcohol abuse, EXCEPT if all of the points are from question 1   No results found for any visits on 08/15/20.  Assessment & Plan  Routine Health Maintenance and Physical Exam  Exercise Activities and Dietary recommendations Goals   None     Immunization History  Administered Date(s) Administered  . Influenza,inj,Quad PF,6+ Mos 04/20/2019  . Influenza-Unspecified 03/31/2013, 03/31/2014, 03/22/2020  . PFIZER(Purple Top)SARS-COV-2 Vaccination 09/05/2019, 09/28/2019  . Td 03/21/2006  . Tdap 08/05/2019    Health Maintenance  Topic Date Due  . COLONOSCOPY (Pts 45-59yrs Insurance coverage will need to be confirmed)  01/29/2019  . COVID-19 Vaccine (3  - Booster for Pfizer series) 03/30/2020  . MAMMOGRAM  06/28/2020  . PAP SMEAR-Modifier  06/16/2020  . TETANUS/TDAP  08/04/2029  . INFLUENZA VACCINE  Completed  . Hepatitis C Screening  Completed  . HIV Screening  Completed    Discussed health benefits of physical activity, and encouraged her to engage in regular exercise appropriate for her age and condition.  1. Annual physical exam Defers pelvic exam till next year Follow-up mammogram and colonoscopy - Lipid panel - TSH - CBC w/Diff/Platelet - Comprehensive Metabolic Panel (CMET)  2. Essential hypertension, benign   3. Other hyperlipidemia   4. Screening for blood or protein in urine  - POCT urinalysis dipstick  5. Allergic rhinitis, unspecified seasonality, unspecified trigger Try Zyrtec 10 mg daily   No follow-ups on file.     I, Barbara Durie, MD, have reviewed all documentation for this visit. The documentation on 08/18/20 for the exam, diagnosis, procedures, and orders are all accurate and complete.    Traci Plemons Cranford Mon, MD  [facility] [phone] (phone) [phone] (fax)  [place]

## 2020-08-18 ENCOUNTER — Other Ambulatory Visit: Payer: Self-pay

## 2020-08-18 ENCOUNTER — Other Ambulatory Visit: Payer: Self-pay | Admitting: Family Medicine

## 2020-08-18 ENCOUNTER — Other Ambulatory Visit: Payer: BC Managed Care – PPO

## 2020-08-18 DIAGNOSIS — Z0000 Encounter for general adult medical examination without abnormal findings: Secondary | ICD-10-CM

## 2020-08-18 LAB — POCT URINALYSIS DIPSTICK
Bilirubin, UA: NEGATIVE
Blood, UA: NEGATIVE
Glucose, UA: NEGATIVE
Ketones, UA: NEGATIVE
Leukocytes, UA: NEGATIVE
Nitrite, UA: NEGATIVE
Protein, UA: NEGATIVE
Spec Grav, UA: 1.01 (ref 1.010–1.025)
Urobilinogen, UA: 0.2 E.U./dL
pH, UA: 5 (ref 5.0–8.0)

## 2020-08-19 LAB — CBC WITH DIFFERENTIAL/PLATELET
Basophils Absolute: 0 10*3/uL (ref 0.0–0.2)
Basos: 1 %
EOS (ABSOLUTE): 0.4 10*3/uL (ref 0.0–0.4)
Eos: 7 %
Hematocrit: 42.9 % (ref 34.0–46.6)
Hemoglobin: 13.9 g/dL (ref 11.1–15.9)
Immature Grans (Abs): 0 10*3/uL (ref 0.0–0.1)
Immature Granulocytes: 0 %
Lymphocytes Absolute: 2 10*3/uL (ref 0.7–3.1)
Lymphs: 34 %
MCH: 27.9 pg (ref 26.6–33.0)
MCHC: 32.4 g/dL (ref 31.5–35.7)
MCV: 86 fL (ref 79–97)
Monocytes Absolute: 0.4 10*3/uL (ref 0.1–0.9)
Monocytes: 7 %
Neutrophils Absolute: 3 10*3/uL (ref 1.4–7.0)
Neutrophils: 51 %
Platelets: 217 10*3/uL (ref 150–450)
RBC: 4.99 x10E6/uL (ref 3.77–5.28)
RDW: 12.6 % (ref 11.7–15.4)
WBC: 5.8 10*3/uL (ref 3.4–10.8)

## 2020-08-19 LAB — TSH: TSH: 1.88 u[IU]/mL (ref 0.450–4.500)

## 2020-08-19 LAB — COMPREHENSIVE METABOLIC PANEL
ALT: 13 IU/L (ref 0–32)
AST: 13 IU/L (ref 0–40)
Albumin/Globulin Ratio: 1.4 (ref 1.2–2.2)
Albumin: 4.2 g/dL (ref 3.8–4.8)
Alkaline Phosphatase: 104 IU/L (ref 44–121)
BUN/Creatinine Ratio: 20 (ref 12–28)
BUN: 15 mg/dL (ref 8–27)
Bilirubin Total: 0.5 mg/dL (ref 0.0–1.2)
CO2: 21 mmol/L (ref 20–29)
Calcium: 9.9 mg/dL (ref 8.7–10.3)
Chloride: 106 mmol/L (ref 96–106)
Creatinine, Ser: 0.75 mg/dL (ref 0.57–1.00)
GFR calc Af Amer: 99 mL/min/{1.73_m2} (ref >59)
GFR calc non Af Amer: 86 mL/min/{1.73_m2} (ref >59)
Globulin, Total: 2.9 g/dL (ref 1.5–4.5)
Glucose: 106 mg/dL — ABNORMAL HIGH (ref 65–99)
Potassium: 4.6 mmol/L (ref 3.5–5.2)
Sodium: 145 mmol/L — ABNORMAL HIGH (ref 134–144)
Total Protein: 7.1 g/dL (ref 6.0–8.5)

## 2020-08-19 LAB — LIPID PANEL
Chol/HDL Ratio: 4.8 ratio — ABNORMAL HIGH (ref 0.0–4.4)
Cholesterol, Total: 183 mg/dL (ref 100–199)
HDL: 38 mg/dL — ABNORMAL LOW (ref >39)
LDL Chol Calc (NIH): 122 mg/dL — ABNORMAL HIGH (ref 0–99)
Triglycerides: 128 mg/dL (ref 0–149)
VLDL Cholesterol Cal: 23 mg/dL (ref 5–40)

## 2020-08-23 ENCOUNTER — Telehealth: Payer: Self-pay | Admitting: Family Medicine

## 2020-08-29 MED ORDER — FLUTICASONE PROPIONATE 50 MCG/ACT NA SUSP: NASAL | 3 refills | Status: DC

## 2020-08-29 NOTE — Telephone Encounter (Signed)
Resent

## 2020-08-29 NOTE — Addendum Note (Signed)
Addended by: Julieta Bellini on: 08/29/2020 05:15 PM   Modules accepted: Orders

## 2020-08-29 NOTE — Telephone Encounter (Signed)
Pt called stating that she spoke with the pharmacy and that they do not have any refills on file for this medication. Please advise.

## 2020-08-31 ENCOUNTER — Ambulatory Visit
Admission: RE | Admit: 2020-08-31 | Discharge: 2020-08-31 | Disposition: A | Discharge status: Home / Self Care | Payer: BC Managed Care – PPO | Source: Ambulatory Visit | Attending: Family Medicine | Admitting: Family Medicine

## 2020-08-31 ENCOUNTER — Other Ambulatory Visit: Payer: Self-pay

## 2020-08-31 DIAGNOSIS — Z1231 Encounter for screening mammogram for malignant neoplasm of breast: Secondary | ICD-10-CM | POA: Diagnosis not present

## 2020-09-04 ENCOUNTER — Other Ambulatory Visit: Payer: Self-pay | Admitting: Family Medicine

## 2020-09-04 NOTE — Telephone Encounter (Signed)
Requested Prescriptions  Pending Prescriptions Disp Refills  . metoprolol tartrate (LOPRESSOR) 25 MG tablet [Pharmacy Med Name: METOPROLOL TARTRATE 25 MG TAB] 180 tablet 2    Sig: TAKE 1 TABLET BY MOUTH TWICE A DAY     Cardiovascular:  Beta Blockers Passed - 09/04/2020  1:31 AM      Passed - Last BP in normal range    BP Readings from Last 1 Encounters:  08/15/20 137/74         Passed - Last Heart Rate in normal range    Pulse Readings from Last 1 Encounters:  08/15/20 72         Passed - Valid encounter within last 6 months    Recent Outpatient Visits          2 weeks ago Annual physical exam   [facility] Jerrol Banana., MD   4 months ago Pain in left lower leg   [facility] Flinchum, Kelby Aline, FNP   1 year ago Constipation, unspecified constipation type   [facility] Jerrol Banana., MD   1 year ago Annual physical exam   [facility] Jerrol Banana., MD   1 year ago Acute recurrent maxillary sinusitis   [facility] Jerrol Banana., MD      Future Appointments            In 11 months Jerrol Banana., MD [facility] [place]

## 2020-09-19 ENCOUNTER — Other Ambulatory Visit: Payer: Self-pay | Admitting: Family Medicine

## 2020-09-19 DIAGNOSIS — E7849 Other hyperlipidemia: Secondary | ICD-10-CM

## 2020-12-20 ENCOUNTER — Ambulatory Visit: Payer: Self-pay | Admitting: *Deleted

## 2020-12-20 ENCOUNTER — Telehealth: Payer: Self-pay

## 2020-12-20 NOTE — Telephone Encounter (Signed)
Pt called in c/o having pain under her left arm that radiates under her left breast when she leans over or pushes up with her left arm like to get up from a chair.    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   About 2-3 weeks ago this pain started.    The pain is not in her breast but under it and only lasts a few seconds when it occurs.  There are no appts available within the timeframe indicated on the protocol so I have sent a note to [facility] to see if they can work her in.  Protocol is to be worked in within 3 days.  She can be reached at [phone].  I let her know if the pain becomes worse to go to an urgent care or ED.   She was agreeable to this plan of being called back and/or going to the urgent care if need be.

## 2020-12-20 NOTE — Telephone Encounter (Signed)
Reason for Disposition  [1] MODERATE pain (e.g., interferes with normal activities) AND [2] present > 3 days  Answer Assessment - Initial Assessment Questions 1. 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   2-3 weeks ago I noticed I'm having pain under my left arm and breast when I lean over sometimes.   Not all the time.   I have to move furniture to clean.   After moving furniture 2 weeks ago I noticed it started hurting.     When you press to get up I feel it in my arm and when I lean over the pain goes under my breast, not in my breast. 2. LOCATION: "Where is the pain located?"     See above.   No pain in left shoulder. I don't feel knots in my left breast or anything usual. 3. PAIN: "How bad is the pain?" (Scale 1-10; or mild, moderate, severe)   - MILD (1-3): doesn't interfere with normal activities   - MODERATE (4-7): interferes with normal activities (e.g., work or school) or awakens from sleep   - SEVERE (8-10): excruciating pain, unable to do any normal activities, unable to hold a cup of water     When I lean over the pain 7-8 going under my breast not in my breast.   4. 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 5. 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   6. OTHER SYMPTOMS: "Do you have any other symptoms?" (e.g., neck pain, swelling, rash, fever, numbness, weakness)     No 7. PREGNANCY: "Is there any chance you are pregnant?" "When was your last menstrual period?"     N/A due to age  Protocols used: Arm Pain-A-AH

## 2020-12-20 NOTE — Telephone Encounter (Signed)
Please review. Thanks!  

## 2020-12-20 NOTE — Telephone Encounter (Signed)
Another message was sent to provider regarding this.

## 2020-12-20 NOTE — Telephone Encounter (Signed)
Copied from [facility] [id number]. Topic: Appointment Scheduling - Scheduling Inquiry for Clinic >> Dec 20, 2020  8:07 AM Barbara Kaiser wrote: Reason for CRM: patient pulled left muscle and noticed pain when she bends down, she's experiencing the pain for 3 weeks, patient works for [employer] and has to [employer]. Patient unsure if PCP would require an appointment

## 2020-12-22 ENCOUNTER — Other Ambulatory Visit: Payer: Self-pay

## 2020-12-22 ENCOUNTER — Encounter: Payer: Self-pay | Admitting: Family Medicine

## 2020-12-22 ENCOUNTER — Ambulatory Visit: Payer: BC Managed Care – PPO | Admitting: Family Medicine

## 2020-12-22 VITALS — BP 151/75 | HR 64 | Temp 98.5°F | Resp 16 | Ht 67.0 in | Wt 201.0 lb

## 2020-12-22 DIAGNOSIS — S29011A Strain of muscle and tendon of front wall of thorax, initial encounter: Secondary | ICD-10-CM | POA: Diagnosis not present

## 2020-12-22 DIAGNOSIS — R221 Localized swelling, mass and lump, neck: Secondary | ICD-10-CM

## 2020-12-22 NOTE — Progress Notes (Signed)
Established patient visit   Patient: Barbara Kaiser   DOB: 1955-09-11   65 y.o. Female  MRN: [id number] Visit Date: 12/22/2020  Today's healthcare provider: Vernie Murders, PA-C   Chief Complaint  Patient presents with   Breast Pain   Subjective    HPI  Patient presents today c/o pain in her right breast. She reports that she was moving furniture about 2 weeks ago, and feels that she may have pulled a muscle.   Past Medical History:  Diagnosis Date   Allergic rhinitis    Anemia, iron deficiency    Colon polyps 8/14   Complication of anesthesia    hard to wake   Hypertension    PONV (postoperative nausea and vomiting)    Uterine fibroid    with heavy menses and anemia (resolved by ablation)   Past Surgical History:  Procedure Laterality Date   ABDOMINAL HYSTERECTOMY     CARDIAC CATHETERIZATION  05/2005   mild CAD   DILATION AND CURETTAGE OF UTERUS  12/08   hysteroscopy and endometrial ablation   ETHMOIDECTOMY Bilateral 06/18/2019   Procedure: ETHMOIDECTOMY;  Surgeon: Leta Baptist, MD;  Location: [place];  Service: ENT;  Laterality: Bilateral;   FRONTAL SINUS EXPLORATION Bilateral 06/18/2019   Procedure: FRONTAL SINUS EXPLORATION;  Surgeon: Leta Baptist, MD;  Location: [place];  Service: ENT;  Laterality: Bilateral;   LAPAROSCOPIC SUPRACERVICAL HYSTERECTOMY  12/09/2011   Procedure: LAPAROSCOPIC SUPRACERVICAL HYSTERECTOMY;  Surgeon: Osborne Oman, MD;  Location: [facility];  Service: Gynecology;  Laterality: N/A;   MAXILLARY ANTROSTOMY Bilateral 06/18/2019   Procedure: MAXILLARY ANTROSTOMY;  Surgeon: Leta Baptist, MD;  Location: [place];  Service: ENT;  Laterality: Bilateral;   NASAL SINUS SURGERY  11/07   SALPINGOOPHORECTOMY  12/09/2011   Procedure: SALPINGO OOPHERECTOMY;  Surgeon: Osborne Oman, MD;  Location: [facility];  Service: Gynecology;  Laterality: Bilateral;   SINUS ENDO WITH FUSION Bilateral 06/18/2019   Procedure:  SINUS ENDO WITH FUSION;  Surgeon: Leta Baptist, MD;  Location: [place];  Service: ENT;  Laterality: Bilateral;   SPHENOIDECTOMY Bilateral 06/18/2019   Procedure: SPHENOIDECTOMY WITH TISSUE REMOVAL;  Surgeon: Leta Baptist, MD;  Location: [place];  Service: ENT;  Laterality: Bilateral;   SUPRACERVICAL ABDOMINAL HYSTERECTOMY  12/09/2011   Procedure: HYSTERECTOMY SUPRACERVICAL ABDOMINAL;  Surgeon: Osborne Oman, MD;  Location: [facility];  Service: Gynecology;  Laterality: N/A;   TUBAL LIGATION     TURBINATE REDUCTION Bilateral 06/18/2019   Procedure: TURBINATE REDUCTION;  Surgeon: Leta Baptist, MD;  Location: [place];  Service: ENT;  Laterality: Bilateral;   Social History   Tobacco Use   Smoking status: Former    Packs/day: 0.25    Years: 24.00    Pack years: 6.00    Types: Cigarettes    Quit date: 07/01/2001    Years since quitting: 19.4   Smokeless tobacco: Never  Substance Use Topics   Alcohol use: No    Alcohol/week: 0.0 standard drinks   Drug use: No   Family Status  Relation Name Status   Mother  Alive   Father  Deceased       died of cirrhosis   Other Grandmother Alive   Brother  Alive   Other Grandfather Alive   Neg Hx  (Not Specified)   Allergies  Allergen Reactions   Codeine Nausea And Vomiting       Medications: Outpatient Medications Prior  to Visit  Medication Sig   amLODipine (NORVASC) 5 MG tablet TAKE 1 TABLET BY MOUTH EVERY DAY   azelastine (ASTELIN) 0.1 % nasal spray 2 sprays in each nostril daily until symptoms resolve   cetirizine (ZYRTEC) 10 MG tablet Take 1 tablet (10 mg total) by mouth daily.   fluticasone (FLONASE) 50 MCG/ACT nasal spray USE 2 SPRAY INTO EACH NOSTRIL DAILY   metoprolol tartrate (LOPRESSOR) 25 MG tablet TAKE 1 TABLET BY MOUTH TWICE A DAY   Multiple Vitamin (MULTIVITAMIN) tablet Take 1 tablet by mouth daily.   simvastatin (ZOCOR) 20 MG tablet TAKE 1 TABLET BY MOUTH EVERY DAY   fexofenadine  (ALLEGRA) 180 MG tablet Take 1 tablet (180 mg total) by mouth daily. (Patient not taking: No sig reported)   linaclotide (LINZESS) 145 MCG CAPS capsule Take 1 capsule (145 mcg total) by mouth daily before breakfast. (Patient not taking: No sig reported)   sodium chloride (OCEAN) 0.65 % SOLN nasal spray Place 2 sprays into both nostrils as needed for up to 14 days for congestion.   No facility-administered medications prior to visit.    Review of Systems  Constitutional:  Negative for activity change.  Cardiovascular:  Negative for chest pain.  Musculoskeletal:  Positive for myalgias. Negative for arthralgias and back pain.  Skin:  Negative for color change, pallor, rash and wound.       Objective    BP (!) 151/75   Pulse 64   Temp 98.5 F (36.9 C)   Resp 16   Ht 5\' 7"  (1.702 m)   Wt 201 lb (91.2 kg)   LMP 11/28/2011   BMI 31.48 kg/m  BP Readings from Last 3 Encounters:  04/09/21 (!) 169/84  03/26/21 (!) 173/82  01/29/21 128/74   Wt Readings from Last 3 Encounters:  04/09/21 199 lb (90.3 kg)  03/26/21 198 lb (89.8 kg)  01/29/21 198 lb (89.8 kg)       Physical Exam Constitutional:      General: She is not in acute distress.    Appearance: She is well-developed.  HENT:     Head: Normocephalic and atraumatic.     Right Ear: Hearing normal.     Left Ear: Hearing normal.     Nose: Nose normal.  Eyes:     General: Lids are normal. No scleral icterus.       Right eye: No discharge.        Left eye: No discharge.     Conjunctiva/sclera: Conjunctivae normal.  Neck:     Vascular: No carotid bruit.     Comments: Pulsatile mass right carotid at the base of neck. No thyroid enlargement or nodules. No bruits. Cardiovascular:     Rate and Rhythm: Normal rate and regular rhythm.     Pulses: Normal pulses.     Heart sounds: Normal heart sounds.  Pulmonary:     Effort: Pulmonary effort is normal. No respiratory distress.     Breath sounds: Normal breath sounds.      Comments: Tender left pectoralis muscle above and behind breast. No masses in breast or local lymphadenopathy. Musculoskeletal:        General: Normal range of motion.  Skin:    Findings: No lesion or rash.  Neurological:     Mental Status: She is alert and oriented to person, place, and time.  Psychiatric:        Speech: Speech normal.        Behavior: Behavior normal.  Thought Content: Thought content normal.      No results found for any visits on 12/22/20.  Assessment & Plan     1. Strain of left pectoralis muscle, initial encounter Onset after moving some furniture at work recently. Pain in chest behind breast when she pushes with her arms. Restrict strenuous activities and may take Advil prn.  2. Pulsatile neck mass Present for many years. Very concerned and requests referral for evaluation to rule out carotid mass or aneurysm. - Ambulatory referral to Vascular Surgery    No follow-ups on file.      I, Dennis Chrismon, PA-C, have reviewed all documentation for this visit. The documentation on 12/22/20 for the exam, diagnosis, procedures, and orders are all accurate and complete.  Am the supervising physician for Fannie Knee, PA and I am signing off on this unfinished note with no further clinical information [name]., MD   Vernie Murders, PA-C  [facility] [phone] (phone) [phone] (fax)  [place]

## 2020-12-27 ENCOUNTER — Ambulatory Visit: Payer: BC Managed Care – PPO | Admitting: Nurse Practitioner

## 2020-12-27 ENCOUNTER — Other Ambulatory Visit: Payer: Self-pay

## 2020-12-27 DIAGNOSIS — L237 Allergic contact dermatitis due to plants, except food: Secondary | ICD-10-CM

## 2020-12-27 MED ORDER — PREDNISONE 10 MG PO TABS: ORAL_TABLET | ORAL | 0 refills | Status: DC

## 2020-12-27 NOTE — Progress Notes (Signed)
   Subjective:    Patient ID: Barbara Kaiser, female    DOB: 06-Oct-1955, 65 y.o.   MRN: [id number]  HPI  65 year old female presenting to [facility] with complaints of poison ivy rash.   This started over the past weekend while at a family member's house. It is now spreading from neck to groin.   She has some spots on her left ear as well.   She has noticed that heat has been irritating the rash.   She has taken prednisone in the past without side effects.   She has also been suffering from chronic lower back pain for the past year, she has seen PCP about it, has also had injections without relief. She has had an MRI as well.     Review of Systems  Constitutional: Negative.   HENT: Negative.    Respiratory: Negative.    Cardiovascular: Negative.   Musculoskeletal:  Positive for back pain.  Skin:  Positive for rash.     Allergies  Allergen Reactions   Codeine Nausea And Vomiting       Objective:   Physical Exam Constitutional:      Appearance: Normal appearance.  HENT:     Head: Normocephalic.  Pulmonary:     Effort: Pulmonary effort is normal.  Skin:    Findings: Erythema present.          Comments: Rash to outlined areas above, raised erythematous, dry linear pattern on right neck   Neurological:     Mental Status: She is alert.          Assessment & Plan:   Discussed with patient to take cool showers, to use only non scented soaps without dye, may use topical calamine lotion avoid topical steroids while taking oral steroids.   Change bed sheets and pillow case often to avoid spreading oils to other parts of body.   Star oral steroid taper and take with food. RTC with new or worsening symptoms.   Encouraged follow up with PCP regarding ongoing back pain for evaluation and referral as appropriate to Ortho  Meds ordered this encounter  Medications   predniSONE (DELTASONE) 10 MG tablet    Sig: Take 6 tablets on day one, 5 tablets on day two, 4 tablets on  day three, 3 tablets on day four, 2 tablets on day five, 1 tablet on day six. Take with food.    Dispense:  21 tablet    Refill:  0

## 2020-12-29 DIAGNOSIS — M48062 Spinal stenosis, lumbar region with neurogenic claudication: Secondary | ICD-10-CM | POA: Diagnosis not present

## 2020-12-29 DIAGNOSIS — M5136 Other intervertebral disc degeneration, lumbar region: Secondary | ICD-10-CM | POA: Diagnosis not present

## 2020-12-29 DIAGNOSIS — M5416 Radiculopathy, lumbar region: Secondary | ICD-10-CM | POA: Diagnosis not present

## 2021-01-29 ENCOUNTER — Other Ambulatory Visit: Payer: Self-pay

## 2021-01-29 ENCOUNTER — Encounter: Payer: Self-pay | Admitting: Medical

## 2021-01-29 ENCOUNTER — Ambulatory Visit: Payer: BC Managed Care – PPO | Admitting: Medical

## 2021-01-29 VITALS — BP 128/74 | HR 79 | Temp 97.8°F | Resp 16 | Wt 198.0 lb

## 2021-01-29 DIAGNOSIS — Z20822 Contact with and (suspected) exposure to covid-19: Secondary | ICD-10-CM

## 2021-01-29 DIAGNOSIS — J0100 Acute maxillary sinusitis, unspecified: Secondary | ICD-10-CM

## 2021-01-29 LAB — POC COVID19 BINAXNOW: SARS Coronavirus 2 Ag: NEGATIVE

## 2021-01-29 MED ORDER — AMOXICILLIN-POT CLAVULANATE 875-125 MG PO TABS: 1.0000 | ORAL_TABLET | Freq: Two times a day (BID) | ORAL | 0 refills | Status: DC

## 2021-01-29 NOTE — Patient Instructions (Signed)
Sinusitis, Adult Sinusitis is soreness and swelling (inflammation) of your sinuses. Sinuses are hollow spaces in the bones around your face. They are located: Around your eyes. In the middle of your forehead. Behind your nose. In your cheekbones. Your sinuses and nasal passages are lined with a fluid called mucus. Mucus drains out of your sinuses. Swelling can trap mucus in your sinuses. This lets germs (bacteria, virus, or fungus) grow, which leads to infection. Most of the time, this condition is caused bya virus. What are the causes? This condition is caused by: Allergies. Asthma. Germs. Things that block your nose or sinuses. Growths in the nose (nasal polyps). Chemicals or irritants in the air. Fungus (rare). What increases the risk? You are more likely to develop this condition if: You have a weak body defense system (immune system). You do a lot of swimming or diving. You use nasal sprays too much. You smoke. What are the signs or symptoms? The main symptoms of this condition are pain and a feeling of pressure around the sinuses. Other symptoms include: Stuffy nose (congestion). Runny nose (drainage). Swelling and warmth in the sinuses. Headache. Toothache. A cough that may get worse at night. Mucus that collects in the throat or the back of the nose (postnasal drip). Being unable to smell and taste. Being very tired (fatigue). A fever. Sore throat. Bad breath. How is this diagnosed? This condition is diagnosed based on: Your symptoms. Your medical history. A physical exam. Tests to find out if your condition is short-term (acute) or long-term (chronic). Your doctor may: Check your nose for growths (polyps). Check your sinuses using a tool that has a light (endoscope). Check for allergies or germs. Do imaging tests, such as an MRI or CT scan. How is this treated? Treatment for this condition depends on the cause and whether it is short-term or long-term. If  caused by a virus, your symptoms should go away on their own within 10 days. You may be given medicines to relieve symptoms. They include: Medicines that shrink swollen tissue in the nose. Medicines that treat allergies (antihistamines). A spray that treats swelling of the nostrils.  Rinses that help get rid of thick mucus in your nose (nasal saline washes). If caused by bacteria, your doctor may wait to see if you will get better without treatment. You may be given antibiotic medicine if you have: A very bad infection. A weak body defense system. If caused by growths in the nose, you may need to have surgery. Follow these instructions at home: Medicines Take, use, or apply over-the-counter and prescription medicines only as told by your doctor. These may include nasal sprays. If you were prescribed an antibiotic medicine, take it as told by your doctor. Do not stop taking the antibiotic even if you start to feel better. Hydrate and humidify  Drink enough water to keep your pee (urine) pale yellow. Use a cool mist humidifier to keep the humidity level in your home above 50%. Breathe in steam for 10-15 minutes, 3-4 times a day, or as told by your doctor. You can do this in the bathroom while a hot shower is running. Try not to spend time in cool or dry air.  Rest Rest as much as you can. Sleep with your head raised (elevated). Make sure you get enough sleep each night. General instructions  Put a warm, moist washcloth on your face 3-4 times a day, or as often as told by your doctor. This will help with discomfort.  Wash your hands often with soap and water. If there is no soap and water, use hand sanitizer. Do not smoke. Avoid being around people who are smoking (secondhand smoke). Keep all follow-up visits as told by your doctor. This is important.  Contact a doctor if: You have a fever. Your symptoms get worse. Your symptoms do not get better within 10 days. Get help right away  if: You have a very bad headache. You cannot stop throwing up (vomiting). You have very bad pain or swelling around your face or eyes. You have trouble seeing. You feel confused. Your neck is stiff. You have trouble breathing. Summary Sinusitis is swelling of your sinuses. Sinuses are hollow spaces in the bones around your face. This condition is caused by tissues in your nose that become inflamed or swollen. This traps germs. These can lead to infection. If you were prescribed an antibiotic medicine, take it as told by your doctor. Do not stop taking it even if you start to feel better. Keep all follow-up visits as told by your doctor. This is important. This information is not intended to replace advice given to you by your health care provider. Make sure you discuss any questions you have with your healthcare provider. Document Revised: 11/17/2017 Document Reviewed: 11/17/2017 Elsevier Patient Education  2022 [employer]. Sinusitis, Adult Sinusitis is soreness and swelling (inflammation) of your sinuses. Sinuses are hollow spaces in the bones around your face. They are located: Around your eyes. In the middle of your forehead. Behind your nose. In your cheekbones. Your sinuses and nasal passages are lined with a fluid called mucus. Mucus drains out of your sinuses. Swelling can trap mucus in your sinuses. This lets germs (bacteria, virus, or fungus) grow, which leads to infection. Most of the time, this condition is caused bya virus. What are the causes? This condition is caused by: Allergies. Asthma. Germs. Things that block your nose or sinuses. Growths in the nose (nasal polyps). Chemicals or irritants in the air. Fungus (rare). What increases the risk? You are more likely to develop this condition if: You have a weak body defense system (immune system). You do a lot of swimming or diving. You use nasal sprays too much. You smoke. What are the signs or symptoms? The  main symptoms of this condition are pain and a feeling of pressure around the sinuses. Other symptoms include: Stuffy nose (congestion). Runny nose (drainage). Swelling and warmth in the sinuses. Headache. Toothache. A cough that may get worse at night. Mucus that collects in the throat or the back of the nose (postnasal drip). Being unable to smell and taste. Being very tired (fatigue). A fever. Sore throat. Bad breath. How is this diagnosed? This condition is diagnosed based on: Your symptoms. Your medical history. A physical exam. Tests to find out if your condition is short-term (acute) or long-term (chronic). Your doctor may: Check your nose for growths (polyps). Check your sinuses using a tool that has a light (endoscope). Check for allergies or germs. Do imaging tests, such as an MRI or CT scan. How is this treated? Treatment for this condition depends on the cause and whether it is short-term or long-term. If caused by a virus, your symptoms should go away on their own within 10 days. You may be given medicines to relieve symptoms. They include: Medicines that shrink swollen tissue in the nose. Medicines that treat allergies (antihistamines). A spray that treats swelling of the nostrils.  Rinses that help get rid  of thick mucus in your nose (nasal saline washes). If caused by bacteria, your doctor may wait to see if you will get better without treatment. You may be given antibiotic medicine if you have: A very bad infection. A weak body defense system. If caused by growths in the nose, you may need to have surgery. Follow these instructions at home: Medicines Take, use, or apply over-the-counter and prescription medicines only as told by your doctor. These may include nasal sprays. If you were prescribed an antibiotic medicine, take it as told by your doctor. Do not stop taking the antibiotic even if you start to feel better. Hydrate and humidify  Drink enough water to  keep your pee (urine) pale yellow. Use a cool mist humidifier to keep the humidity level in your home above 50%. Breathe in steam for 10-15 minutes, 3-4 times a day, or as told by your doctor. You can do this in the bathroom while a hot shower is running. Try not to spend time in cool or dry air.  Rest Rest as much as you can. Sleep with your head raised (elevated). Make sure you get enough sleep each night. General instructions  Put a warm, moist washcloth on your face 3-4 times a day, or as often as told by your doctor. This will help with discomfort. Wash your hands often with soap and water. If there is no soap and water, use hand sanitizer. Do not smoke. Avoid being around people who are smoking (secondhand smoke). Keep all follow-up visits as told by your doctor. This is important.  Contact a doctor if: You have a fever. Your symptoms get worse. Your symptoms do not get better within 10 days. Get help right away if: You have a very bad headache. You cannot stop throwing up (vomiting). You have very bad pain or swelling around your face or eyes. You have trouble seeing. You feel confused. Your neck is stiff. You have trouble breathing. Summary Sinusitis is swelling of your sinuses. Sinuses are hollow spaces in the bones around your face. This condition is caused by tissues in your nose that become inflamed or swollen. This traps germs. These can lead to infection. If you were prescribed an antibiotic medicine, take it as told by your doctor. Do not stop taking it even if you start to feel better. Keep all follow-up visits as told by your doctor. This is important. This information is not intended to replace advice given to you by your health care provider. Make sure you discuss any questions you have with your healthcare provider. Document Revised: 11/17/2017 Document Reviewed: 11/17/2017 Elsevier Patient Education  2022 [employer].

## 2021-01-29 NOTE — Progress Notes (Signed)
Subjective:    Patient ID: Barbara Kaiser, female    DOB: [birth year], 65 y.o.   MRN: [id number]  HPI 65 yo female in non acute distress. Presents to clinic to day for  nasal congetion green color coughing today productve green.  Feeling better today. Saturday  at home Covid  was negative    Blood pressure 128/74, pulse 79, temperature 97.8 F (36.6 C), temperature source Oral, resp. rate 16, weight 198 lb (89.8 kg), last menstrual period 11/28/2011, SpO2 98 %.  Allergies  Allergen Reactions   Codeine Nausea And Vomiting   Vaccinated and boosted Pfizer  Review of Systems  Constitutional:  Negative for chills and fever.  HENT:  Positive for congestion, postnasal drip, rhinorrhea, sneezing, sore throat (saturday, little sunday none today) and tinnitus (i have this all the time). Negative for ear pain, sinus pressure and sinus pain.   Respiratory:  Positive for cough. Negative for shortness of breath.   Cardiovascular:  Negative for chest pain.  Gastrointestinal:  Negative for abdominal pain and diarrhea.  Allergic/Immunologic: Positive for environmental allergies.  Neurological:  Negative for dizziness, syncope, light-headedness and headaches.     Not taking Zyrtec or Allegra or Flonase or Astelin Pending back injection this week Objective:   Physical Exam Vitals and nursing note reviewed.  Constitutional:      Appearance: Normal appearance. She is obese.  HENT:     Head: Normocephalic and atraumatic.     Jaw: There is normal jaw occlusion.     Right Ear: Ear canal and external ear normal. There is impacted cerumen.     Left Ear: Ear canal and external ear normal. A middle ear effusion is present.     Nose: Congestion and rhinorrhea present.     Mouth/Throat:     Lips: Pink.     Mouth: Mucous membranes are moist.     Pharynx: Oropharynx is clear. Uvula swelling (mild vascular) present. No oropharyngeal exudate or posterior oropharyngeal erythema.     Comments: Uvula  mild edema and vascular looking Eyes:     Extraocular Movements: Extraocular movements intact.     Conjunctiva/sclera: Conjunctivae normal.     Pupils: Pupils are equal, round, and reactive to light.  Cardiovascular:     Rate and Rhythm: Normal rate and regular rhythm.     Heart sounds: Normal heart sounds.  Pulmonary:     Effort: Pulmonary effort is normal.     Breath sounds: Normal breath sounds.  Musculoskeletal:        General: Normal range of motion.     Cervical back: Normal range of motion and neck supple.  Skin:    General: Skin is warm and dry.  Neurological:     General: No focal deficit present.     Mental Status: She is alert and oriented to person, place, and time. Mental status is at baseline.  Psychiatric:        Mood and Affect: Mood normal.        Behavior: Behavior normal.        Thought Content: Thought content normal.        Judgment: Judgment normal.         Covid-19 today Negative Assessment & Plan:  Sinusitis URI Hx of HTN reviewed what decongestant she can take  (coricidin hbp) Hx of Tinniitis Hx of chronic back pain pending injection this week. Meds ordered this encounter  Medications   amoxicillin-clavulanate (AUGMENTIN) 875-125 MG tablet    Sig: Take  1 tablet by mouth 2 (two) times daily.    Dispense:  20 tablet    Refill:  0   Rest, increase fluids , OTC Aleve per package instructions for back pain or  fever or  if not feeling well. Patient verbalizes understanding and has no questions at discharge. Marland Kitchen

## 2021-02-08 DIAGNOSIS — M48062 Spinal stenosis, lumbar region with neurogenic claudication: Secondary | ICD-10-CM | POA: Diagnosis not present

## 2021-02-08 DIAGNOSIS — M5416 Radiculopathy, lumbar region: Secondary | ICD-10-CM | POA: Diagnosis not present

## 2021-02-19 ENCOUNTER — Encounter (INDEPENDENT_AMBULATORY_CARE_PROVIDER_SITE_OTHER): Payer: BC Managed Care – PPO | Admitting: Vascular Surgery

## 2021-02-19 DIAGNOSIS — M2560 Stiffness of unspecified joint, not elsewhere classified: Secondary | ICD-10-CM | POA: Diagnosis not present

## 2021-02-19 DIAGNOSIS — M48062 Spinal stenosis, lumbar region with neurogenic claudication: Secondary | ICD-10-CM | POA: Diagnosis not present

## 2021-02-19 DIAGNOSIS — M545 Low back pain, unspecified: Secondary | ICD-10-CM | POA: Diagnosis not present

## 2021-03-19 ENCOUNTER — Encounter (INDEPENDENT_AMBULATORY_CARE_PROVIDER_SITE_OTHER): Payer: BC Managed Care – PPO | Admitting: Vascular Surgery

## 2021-03-23 DIAGNOSIS — M545 Low back pain, unspecified: Secondary | ICD-10-CM | POA: Diagnosis not present

## 2021-03-26 ENCOUNTER — Other Ambulatory Visit: Payer: Self-pay

## 2021-03-26 ENCOUNTER — Encounter (INDEPENDENT_AMBULATORY_CARE_PROVIDER_SITE_OTHER): Payer: Self-pay | Admitting: Vascular Surgery

## 2021-03-26 ENCOUNTER — Ambulatory Visit (INDEPENDENT_AMBULATORY_CARE_PROVIDER_SITE_OTHER): Payer: BC Managed Care – PPO | Admitting: Vascular Surgery

## 2021-03-26 VITALS — BP 173/82 | HR 69 | Resp 16 | Ht 67.0 in | Wt 198.0 lb

## 2021-03-26 DIAGNOSIS — I25118 Atherosclerotic heart disease of native coronary artery with other forms of angina pectoris: Secondary | ICD-10-CM | POA: Diagnosis not present

## 2021-03-26 DIAGNOSIS — I10 Essential (primary) hypertension: Secondary | ICD-10-CM | POA: Diagnosis not present

## 2021-03-26 DIAGNOSIS — E7849 Other hyperlipidemia: Secondary | ICD-10-CM | POA: Diagnosis not present

## 2021-03-26 DIAGNOSIS — J45909 Unspecified asthma, uncomplicated: Secondary | ICD-10-CM

## 2021-03-26 DIAGNOSIS — R221 Localized swelling, mass and lump, neck: Secondary | ICD-10-CM

## 2021-03-26 NOTE — Progress Notes (Signed)
MRN : [id number]  Barbara Kaiser is a 66 y.o. (1955-10-23) female who presents with chief complaint of neck lump that is pulsating.  History of Present Illness:   Location: Base of right neck Character/quality of the symptom: 2 cm x 2 cm nodule or lump that is pulsating Severity: Not severe Duration: Continuous Timing/onset: First noticed several months ago and seems to be slowly enlarging with time Aggravating/context: If her heart races she notices it gets somewhat more prominent Relieving/modifying: Nothing seems to change it.  The patient denies amaurosis fugax. There is no recent history of TIA symptoms or focal motor deficits. There is no prior documented CVA.  There is no history of migraine headaches. There is no history of seizures.  The patient is taking enteric-coated aspirin 81 mg daily.  The patient has a history of coronary artery disease, no recent episodes of angina or shortness of breath. The patient denies PAD or claudication symptoms. There is a history of hyperlipidemia which is being treated with a statin.    No outpatient medications have been marked as taking for the 03/26/21 encounter (Appointment) with Delana Meyer, Dolores Lory, MD.    Past Medical History:  Diagnosis Date   Allergic rhinitis    Anemia, iron deficiency    Colon polyps 5/46   Complication of anesthesia    hard to wake   Hypertension    PONV (postoperative nausea and vomiting)    Uterine fibroid    with heavy menses and anemia (resolved by ablation)    Past Surgical History:  Procedure Laterality Date   ABDOMINAL HYSTERECTOMY     CARDIAC CATHETERIZATION  05/2005   mild CAD   DILATION AND CURETTAGE OF UTERUS  12/08   hysteroscopy and endometrial ablation   ETHMOIDECTOMY Bilateral 06/18/2019   Procedure: ETHMOIDECTOMY;  Surgeon: Leta Baptist, MD;  Location: [facility];  Service: ENT;  Laterality: Bilateral;   FRONTAL SINUS EXPLORATION Bilateral 06/18/2019   Procedure:  FRONTAL SINUS EXPLORATION;  Surgeon: Leta Baptist, MD;  Location: [facility];  Service: ENT;  Laterality: Bilateral;   LAPAROSCOPIC SUPRACERVICAL HYSTERECTOMY  12/09/2011   Procedure: LAPAROSCOPIC SUPRACERVICAL HYSTERECTOMY;  Surgeon: Osborne Oman, MD;  Location: [facility];  Service: Gynecology;  Laterality: N/A;   MAXILLARY ANTROSTOMY Bilateral 06/18/2019   Procedure: MAXILLARY ANTROSTOMY;  Surgeon: Leta Baptist, MD;  Location: [facility];  Service: ENT;  Laterality: Bilateral;   NASAL SINUS SURGERY  11/07   SALPINGOOPHORECTOMY  12/09/2011   Procedure: SALPINGO OOPHERECTOMY;  Surgeon: Osborne Oman, MD;  Location: [facility];  Service: Gynecology;  Laterality: Bilateral;   SINUS ENDO WITH FUSION Bilateral 06/18/2019   Procedure: SINUS ENDO WITH FUSION;  Surgeon: Leta Baptist, MD;  Location: [facility];  Service: ENT;  Laterality: Bilateral;   SPHENOIDECTOMY Bilateral 06/18/2019   Procedure: SPHENOIDECTOMY WITH TISSUE REMOVAL;  Surgeon: Leta Baptist, MD;  Location: [place] [facility];  Service: ENT;  Laterality: Bilateral;   SUPRACERVICAL ABDOMINAL HYSTERECTOMY  12/09/2011   Procedure: HYSTERECTOMY SUPRACERVICAL ABDOMINAL;  Surgeon: Osborne Oman, MD;  Location: [facility];  Service: Gynecology;  Laterality: N/A;   TUBAL LIGATION     TURBINATE REDUCTION Bilateral 06/18/2019   Procedure: TURBINATE REDUCTION;  Surgeon: Leta Baptist, MD;  Location: [facility];  Service: ENT;  Laterality: Bilateral;    Social History Social History   Tobacco Use   Smoking status: Former    Packs/day: 0.25    Years: 24.00  Pack years: 6.00    Types: Cigarettes    Quit date: 07/01/2001    Years since quitting: 19.7   Smokeless tobacco: Never  Substance Use Topics   Alcohol use: No    Alcohol/week: 0.0 standard drinks   Drug use: No    Family History Family History  Problem Relation Age of Onset   Hypertension Mother    Cancer Mother 2       uterine    Hypertension Father    Heart disease Other        MI   Diabetes Other    Stroke Other    Heart disease Brother        age 71 and 23   Cancer Other        lung   Breast cancer Neg Hx     Allergies  Allergen Reactions   Codeine Nausea And Vomiting     REVIEW OF SYSTEMS (Negative unless checked)  Constitutional: [] Weight loss  [] Fever  [] Chills Cardiac: [] Chest pain   [] Chest pressure   [] Palpitations   [] Shortness of breath when laying flat   [] Shortness of breath with exertion. Vascular:  [] Pain in legs with walking   [] Pain in legs at rest  [] History of DVT   [] Phlebitis   [] Swelling in legs   [] Varicose veins   [] Non-healing ulcers Pulmonary:   [] Uses home oxygen   [] Productive cough   [] Hemoptysis   [] Wheeze  [] COPD   [] Asthma Neurologic:  [] Dizziness   [] Seizures   [] History of stroke   [] History of TIA  [] Aphasia   [] Vissual changes   [] Weakness or numbness in arm   [] Weakness or numbness in leg Musculoskeletal:   [] Joint swelling   [] Joint pain   [] Low back pain Hematologic:  [] Easy bruising  [] Easy bleeding   [] Hypercoagulable state   [] Anemic Gastrointestinal:  [] Diarrhea   [] Vomiting  [] Gastroesophageal reflux/heartburn   [] Difficulty swallowing. Genitourinary:  [] Chronic kidney disease   [] Difficult urination  [] Frequent urination   [] Blood in urine Skin:  [] Rashes   [] Ulcers  Psychological:  [] History of anxiety   []  History of major depression.  Physical Examination  There were no vitals filed for this visit. There is no height or weight on file to calculate BMI. Gen: WD/WN, NAD Head: [place]/AT, No temporalis wasting.  Ear/Nose/Throat: Hearing grossly intact, nares w/o erythema or drainage Eyes: PER, EOMI, sclera nonicteric.  Neck: Supple, no masses.  No bruit or JVD.  Pulmonary:  Good air movement, no audible wheezing, no use of accessory muscles.  Cardiac: RRR, normal S1, S2, no Murmurs. Vascular: 2 cm x 2 cm palpable pulsatile mass consistent with a carotid  artery.  I am able to palpate almost circumferentially and this appears more consistent with tortuosity than an aneurysmal change to the common carotid or innominate Vessel Right Left  Radial Palpable Palpable  Carotid Palpable Palpable  Gastrointestinal: soft, non-distended. No guarding/no peritoneal signs.  Musculoskeletal: M/S 5/5 throughout.  No visible deformity.  Neurologic: CN 2-12 intact. Pain and light touch intact in extremities.  Symmetrical.  Speech is fluent. Motor exam as listed above. Psychiatric: Judgment intact, Mood & affect appropriate for pt's clinical situation. Dermatologic: No rashes or ulcers noted.  No changes consistent with cellulitis.   CBC Lab Results  Component Value Date   WBC 5.8 08/18/2020   HGB 13.9 08/18/2020   HCT 42.9 08/18/2020   MCV 86 08/18/2020   PLT 217 08/18/2020    BMET    Component Value  Date/Time   NA 145 (H) 08/18/2020 0834   NA 142 07/20/2014 2216   K 4.6 08/18/2020 0834   K 3.3 (L) 07/20/2014 2216   CL 106 08/18/2020 0834   CL 107 07/20/2014 2216   CO2 21 08/18/2020 0834   CO2 27 07/20/2014 2216   GLUCOSE 106 (H) 08/18/2020 0834   GLUCOSE 139 (H) 01/09/2015 0832   GLUCOSE 137 (H) 07/20/2014 2216   BUN 15 08/18/2020 0834   BUN 12 07/20/2014 2216   CREATININE 0.75 08/18/2020 0834   CREATININE 0.90 07/20/2014 2216   CALCIUM 9.9 08/18/2020 0834   CALCIUM 8.8 07/20/2014 2216   GFRNONAA 86 08/18/2020 0834   GFRNONAA >60 07/20/2014 2216   GFRAA 99 08/18/2020 0834   GFRAA >60 07/20/2014 2216   CrCl cannot be calculated (Patient's most recent lab result is older than the maximum 21 days allowed.).  COAG No results found for: INR, PROTIME  Radiology No results found.   Assessment/Plan 1. Pulsatile neck mass Recommend:  Given the patient's asymptomatic pulsatile mass at the base of the right neck it is most likely a carotid process.  By my exam I suspect it is related more to tortuosity than aneurysmal deterioration.   Given this finding further evaluation with duplex ultrasound is warranted.  Duplex ultrasound of the carotid arteries will be obtained and the patient will follow up with me at that time.  Continue antiplatelet therapy as prescribed Continue management of CAD, HTN and Hyperlipidemia Healthy heart diet,  encouraged exercise at least 4 times per week    2. HYPERTENSION, MILD Continue antihypertensive medications as already ordered, these medications have been reviewed and there are no changes at this time.   3. Coronary artery disease of native artery of native heart with stable angina pectoris (HCC) Continue cardiac and antihypertensive medications as already ordered and reviewed, no changes at this time.  Continue statin as ordered and reviewed, no changes at this time  Nitrates PRN for chest pain   4. Other hyperlipidemia Continue statin as ordered and reviewed, no changes at this time   5. Chronic asthma without complication, unspecified asthma severity, unspecified whether persistent Continue pulmonary medications and aerosols as already ordered, these medications have been reviewed and there are no changes at this time.       Hortencia Pilar, MD  03/26/2021 8:34 AM

## 2021-03-28 ENCOUNTER — Other Ambulatory Visit: Payer: Self-pay

## 2021-03-28 ENCOUNTER — Ambulatory Visit: Payer: BC Managed Care – PPO

## 2021-03-28 ENCOUNTER — Encounter (INDEPENDENT_AMBULATORY_CARE_PROVIDER_SITE_OTHER): Payer: Self-pay | Admitting: Vascular Surgery

## 2021-03-28 DIAGNOSIS — R221 Localized swelling, mass and lump, neck: Secondary | ICD-10-CM | POA: Insufficient documentation

## 2021-03-28 DIAGNOSIS — Z23 Encounter for immunization: Secondary | ICD-10-CM

## 2021-04-06 ENCOUNTER — Other Ambulatory Visit (INDEPENDENT_AMBULATORY_CARE_PROVIDER_SITE_OTHER): Payer: Self-pay | Admitting: Vascular Surgery

## 2021-04-06 DIAGNOSIS — R221 Localized swelling, mass and lump, neck: Secondary | ICD-10-CM

## 2021-04-09 ENCOUNTER — Other Ambulatory Visit: Payer: Self-pay

## 2021-04-09 ENCOUNTER — Ambulatory Visit (INDEPENDENT_AMBULATORY_CARE_PROVIDER_SITE_OTHER): Payer: BC Managed Care – PPO | Admitting: Vascular Surgery

## 2021-04-09 ENCOUNTER — Encounter (INDEPENDENT_AMBULATORY_CARE_PROVIDER_SITE_OTHER): Payer: Self-pay | Admitting: Vascular Surgery

## 2021-04-09 ENCOUNTER — Ambulatory Visit (INDEPENDENT_AMBULATORY_CARE_PROVIDER_SITE_OTHER): Payer: BC Managed Care – PPO

## 2021-04-09 VITALS — BP 169/84 | HR 73 | Resp 16 | Wt 199.0 lb

## 2021-04-09 DIAGNOSIS — E7849 Other hyperlipidemia: Secondary | ICD-10-CM

## 2021-04-09 DIAGNOSIS — R221 Localized swelling, mass and lump, neck: Secondary | ICD-10-CM

## 2021-04-09 DIAGNOSIS — I10 Essential (primary) hypertension: Secondary | ICD-10-CM | POA: Diagnosis not present

## 2021-04-09 DIAGNOSIS — I6523 Occlusion and stenosis of bilateral carotid arteries: Secondary | ICD-10-CM | POA: Diagnosis not present

## 2021-04-09 DIAGNOSIS — I25118 Atherosclerotic heart disease of native coronary artery with other forms of angina pectoris: Secondary | ICD-10-CM | POA: Diagnosis not present

## 2021-04-17 ENCOUNTER — Encounter (INDEPENDENT_AMBULATORY_CARE_PROVIDER_SITE_OTHER): Payer: Self-pay | Admitting: Vascular Surgery

## 2021-04-17 DIAGNOSIS — I6529 Occlusion and stenosis of unspecified carotid artery: Secondary | ICD-10-CM | POA: Insufficient documentation

## 2021-04-17 NOTE — Progress Notes (Signed)
MRN : [id number]  Barbara Kaiser is a 65 y.o. (1956/05/02) female who presents with chief complaint of check my carotid arteries.  History of Present Illness:   The patient is seen for follow up evaluation of carotid stenosis. The carotid stenosis followed by ultrasound.   The patient denies amaurosis fugax. There is no recent history of TIA symptoms or focal motor deficits. There is no prior documented CVA.  The patient is taking enteric-coated aspirin 81 mg daily.  There is no history of migraine headaches. There is no history of seizures.  The patient has a history of coronary artery disease, no recent episodes of angina or shortness of breath. The patient denies PAD or claudication symptoms. There is a history of hyperlipidemia which is being treated with a statin.    Carotid Duplex done today shows 1-39% bilateral ICA stenosis.  No change compared to last study.   Current Meds  Medication Sig   amLODipine (NORVASC) 5 MG tablet TAKE 1 TABLET BY MOUTH EVERY DAY   azelastine (ASTELIN) 0.1 % nasal spray 2 sprays in each nostril daily until symptoms resolve   cetirizine (ZYRTEC) 10 MG tablet Take 1 tablet (10 mg total) by mouth daily.   fluticasone (FLONASE) 50 MCG/ACT nasal spray USE 2 SPRAY INTO EACH NOSTRIL DAILY   metoprolol tartrate (LOPRESSOR) 25 MG tablet TAKE 1 TABLET BY MOUTH TWICE A DAY   Multiple Vitamin (MULTIVITAMIN) tablet Take 1 tablet by mouth daily.   simvastatin (ZOCOR) 20 MG tablet TAKE 1 TABLET BY MOUTH EVERY DAY    Past Medical History:  Diagnosis Date   Allergic rhinitis    Anemia, iron deficiency    Colon polyps 9/74   Complication of anesthesia    hard to wake   Hypertension    PONV (postoperative nausea and vomiting)    Uterine fibroid    with heavy menses and anemia (resolved by ablation)    Past Surgical History:  Procedure Laterality Date   ABDOMINAL HYSTERECTOMY     CARDIAC CATHETERIZATION  05/2005   mild CAD   DILATION AND CURETTAGE  OF UTERUS  12/08   hysteroscopy and endometrial ablation   ETHMOIDECTOMY Bilateral 06/18/2019   Procedure: ETHMOIDECTOMY;  Surgeon: Leta Baptist, MD;  Location: [place];  Service: ENT;  Laterality: Bilateral;   FRONTAL SINUS EXPLORATION Bilateral 06/18/2019   Procedure: FRONTAL SINUS EXPLORATION;  Surgeon: Leta Baptist, MD;  Location: [place];  Service: ENT;  Laterality: Bilateral;   LAPAROSCOPIC SUPRACERVICAL HYSTERECTOMY  12/09/2011   Procedure: LAPAROSCOPIC SUPRACERVICAL HYSTERECTOMY;  Surgeon: Osborne Oman, MD;  Location: [facility];  Service: Gynecology;  Laterality: N/A;   MAXILLARY ANTROSTOMY Bilateral 06/18/2019   Procedure: MAXILLARY ANTROSTOMY;  Surgeon: Leta Baptist, MD;  Location: [place];  Service: ENT;  Laterality: Bilateral;   NASAL SINUS SURGERY  11/07   SALPINGOOPHORECTOMY  12/09/2011   Procedure: SALPINGO OOPHERECTOMY;  Surgeon: Osborne Oman, MD;  Location: [facility];  Service: Gynecology;  Laterality: Bilateral;   SINUS ENDO WITH FUSION Bilateral 06/18/2019   Procedure: SINUS ENDO WITH FUSION;  Surgeon: Leta Baptist, MD;  Location: [place];  Service: ENT;  Laterality: Bilateral;   SPHENOIDECTOMY Bilateral 06/18/2019   Procedure: SPHENOIDECTOMY WITH TISSUE REMOVAL;  Surgeon: Leta Baptist, MD;  Location: [place];  Service: ENT;  Laterality: Bilateral;   SUPRACERVICAL ABDOMINAL HYSTERECTOMY  12/09/2011   Procedure: HYSTERECTOMY SUPRACERVICAL ABDOMINAL;  Surgeon: Osborne Oman, MD;  Location: [facility];  Service: Gynecology;  Laterality: N/A;   TUBAL LIGATION     TURBINATE REDUCTION Bilateral 06/18/2019   Procedure: TURBINATE REDUCTION;  Surgeon: Leta Baptist, MD;  Location: [place];  Service: ENT;  Laterality: Bilateral;    Social History Social History   Tobacco Use   Smoking status: Former    Packs/day: 0.25    Years: 24.00    Pack years: 6.00    Types: Cigarettes    Quit date:  07/01/2001    Years since quitting: 19.8   Smokeless tobacco: Never  Substance Use Topics   Alcohol use: No    Alcohol/week: 0.0 standard drinks   Drug use: No    Family History Family History  Problem Relation Age of Onset   Hypertension Mother    Cancer Mother 56       uterine   Hypertension Father    Heart disease Other        MI   Diabetes Other    Stroke Other    Heart disease Brother        age 77 and 35   Cancer Other        lung   Breast cancer Neg Hx     Allergies  Allergen Reactions   Codeine Nausea And Vomiting     REVIEW OF SYSTEMS (Negative unless checked)  Constitutional: [] Weight loss  [] Fever  [] Chills Cardiac: [] Chest pain   [] Chest pressure   [] Palpitations   [] Shortness of breath when laying flat   [] Shortness of breath with exertion. Vascular:  [] Pain in legs with walking   [] Pain in legs at rest  [] History of DVT   [] Phlebitis   [] Swelling in legs   [] Varicose veins   [] Non-healing ulcers Pulmonary:   [] Uses home oxygen   [] Productive cough   [] Hemoptysis   [] Wheeze  [] COPD   [] Asthma Neurologic:  [] Dizziness   [] Seizures   [] History of stroke   [] History of TIA  [] Aphasia   [] Vissual changes   [] Weakness or numbness in arm   [] Weakness or numbness in leg Musculoskeletal:   [] Joint swelling   [] Joint pain   [] Low back pain Hematologic:  [] Easy bruising  [] Easy bleeding   [] Hypercoagulable state   [] Anemic Gastrointestinal:  [] Diarrhea   [] Vomiting  [] Gastroesophageal reflux/heartburn   [] Difficulty swallowing. Genitourinary:  [] Chronic kidney disease   [] Difficult urination  [] Frequent urination   [] Blood in urine Skin:  [] Rashes   [] Ulcers  Psychological:  [] History of anxiety   []  History of major depression.  Physical Examination  Vitals:   04/09/21 1448  BP: (!) 169/84  Pulse: 73  Resp: 16  Weight: 199 lb (90.3 kg)   Body mass index is 31.17 kg/m. Gen: WD/WN, NAD Head: [place]/AT, No temporalis wasting.  Ear/Nose/Throat: Hearing grossly  intact, nares w/o erythema or drainage Eyes: PER, EOMI, sclera nonicteric.  Neck: Supple, no masses.  No bruit or JVD.  Pulmonary:  Good air movement, no audible wheezing, no use of accessory muscles.  Cardiac: RRR, normal S1, S2, no Murmurs. Vascular:  no carotid bruits Vessel Right Left  Radial Palpable Palpable  Carotid Palpable Palpable  Gastrointestinal: soft, non-distended. No guarding/no peritoneal signs.  Musculoskeletal: M/S 5/5 throughout.  No visible deformity.  Neurologic: CN 2-12 intact. Pain and light touch intact in extremities.  Symmetrical.  Speech is fluent. Motor exam as listed above. Psychiatric: Judgment intact, Mood & affect appropriate for pt's clinical situation. Dermatologic: No rashes or ulcers noted.  No changes  consistent with cellulitis.   CBC Lab Results  Component Value Date   WBC 5.8 08/18/2020   HGB 13.9 08/18/2020   HCT 42.9 08/18/2020   MCV 86 08/18/2020   PLT 217 08/18/2020    BMET    Component Value Date/Time   NA 145 (H) 08/18/2020 0834   NA 142 07/20/2014 2216   K 4.6 08/18/2020 0834   K 3.3 (L) 07/20/2014 2216   CL 106 08/18/2020 0834   CL 107 07/20/2014 2216   CO2 21 08/18/2020 0834   CO2 27 07/20/2014 2216   GLUCOSE 106 (H) 08/18/2020 0834   GLUCOSE 139 (H) 01/09/2015 0832   GLUCOSE 137 (H) 07/20/2014 2216   BUN 15 08/18/2020 0834   BUN 12 07/20/2014 2216   CREATININE 0.75 08/18/2020 0834   CREATININE 0.90 07/20/2014 2216   CALCIUM 9.9 08/18/2020 0834   CALCIUM 8.8 07/20/2014 2216   GFRNONAA 86 08/18/2020 0834   GFRNONAA >60 07/20/2014 2216   GFRAA 99 08/18/2020 0834   GFRAA >60 07/20/2014 2216   CrCl cannot be calculated (Patient's most recent lab result is older than the maximum 21 days allowed.).  COAG No results found for: INR, PROTIME  Radiology VAS US CAROTID  Result Date: 04/09/2021 Carotid Arterial Duplex Study Patient Name:  SHAWNTEE MAINWARING  Date of Exam:   04/09/2021 Medical Rec #: [id number]       Accession  #:    [id number] Date of Birth: [birth year]       Patient Gender: F Patient Age:   14 years Exam Location:  [facility] Procedure:      VAS US CAROTID Referring Phys: Hortencia Pilar --------------------------------------------------------------------------------  Indications: Rt Pulsatile Neck Mass. Performing Technologist: Almira Coaster RVS  Examination Guidelines: A complete evaluation includes B-mode imaging, spectral Doppler, color Doppler, and power Doppler as needed of all accessible portions of each vessel. Bilateral testing is considered an integral part of a complete examination. Limited examinations for reoccurring indications may be performed as noted.  Right Carotid Findings: +----------+--------+--------+--------+------------------+--------+           PSV cm/sEDV cm/sStenosisPlaque DescriptionComments +----------+--------+--------+--------+------------------+--------+ CCA Prox  138     19                                         +----------+--------+--------+--------+------------------+--------+ CCA Mid   136     26                                         +----------+--------+--------+--------+------------------+--------+ CCA Distal87      20                                         +----------+--------+--------+--------+------------------+--------+ ICA Prox  80      20                                         +----------+--------+--------+--------+------------------+--------+ ICA Mid   93      21                                         +----------+--------+--------+--------+------------------+--------+  ICA Distal69      22                                         +----------+--------+--------+--------+------------------+--------+ ECA       136     16                                         +----------+--------+--------+--------+------------------+--------+ +----------+--------+-------+--------+-------------------+           PSV  cm/sEDV cmsDescribeArm Pressure (mmHG) +----------+--------+-------+--------+-------------------+ Subclavian150     0                                  +----------+--------+-------+--------+-------------------+ +---------+--------+--+--------+--+ VertebralPSV cm/s41EDV cm/s11 +---------+--------+--+--------+--+  Left Carotid Findings: +----------+--------+--------+--------+------------------+--------+           PSV cm/sEDV cm/sStenosisPlaque DescriptionComments +----------+--------+--------+--------+------------------+--------+ CCA Prox  105     20                                         +----------+--------+--------+--------+------------------+--------+ CCA Mid   109     22                                         +----------+--------+--------+--------+------------------+--------+ CCA Distal103     17                                         +----------+--------+--------+--------+------------------+--------+ ICA Prox  63      16                                         +----------+--------+--------+--------+------------------+--------+ ICA Mid   80      27                                         +----------+--------+--------+--------+------------------+--------+ ICA Distal79      25                                         +----------+--------+--------+--------+------------------+--------+ ECA       185     15                                         +----------+--------+--------+--------+------------------+--------+ +----------+--------+--------+--------+-------------------+           PSV cm/sEDV cm/sDescribeArm Pressure (mmHG) +----------+--------+--------+--------+-------------------+ [id number]     0                                   +----------+--------+--------+--------+-------------------+ +---------+--------+--+--------+--+  VertebralPSV cm/s74EDV cm/s14 +---------+--------+--+--------+--+   Summary: Right Carotid:  Velocities in the right ICA are consistent with a 1-39% stenosis.                The Brachiocephalic Trunk appears to be Normal with no evidence                of plaque seen. Left Carotid: Velocities in the left ICA are consistent with a 1-39% stenosis. Vertebrals:  Bilateral vertebral arteries demonstrate antegrade flow. Subclavians: Normal flow hemodynamics were seen in bilateral subclavian              arteries. *See table(s) above for measurements and observations.  Electronically signed by Hortencia Pilar MD on 04/09/2021 at 5:05:48 PM.    Final      Assessment/Plan 1. Bilateral carotid artery stenosis Recommend:  Given the patient's asymptomatic subcritical stenosis no further invasive testing or surgery at this time.  Duplex ultrasound shows <40% stenosis bilaterally.  Continue antiplatelet therapy as prescribed Continue management of CAD, HTN and Hyperlipidemia Healthy heart diet,  encouraged exercise at least 4 times per week Follow up PRN   2. Coronary artery disease of native artery of native heart with stable angina pectoris (HCC) Continue cardiac and antihypertensive medications as already ordered and reviewed, no changes at this time.  Continue statin as ordered and reviewed, no changes at this time  Nitrates PRN for chest pain   3. HYPERTENSION, MILD Continue antihypertensive medications as already ordered, these medications have been reviewed and there are no changes at this time.   4. Other hyperlipidemia Continue statin as ordered and reviewed, no changes at this time      Hortencia Pilar, MD  04/17/2021 3:55 PM

## 2021-04-19 DIAGNOSIS — M545 Low back pain, unspecified: Secondary | ICD-10-CM | POA: Diagnosis not present

## 2021-04-23 ENCOUNTER — Encounter (INDEPENDENT_AMBULATORY_CARE_PROVIDER_SITE_OTHER): Payer: BC Managed Care – PPO | Admitting: Vascular Surgery

## 2021-05-24 ENCOUNTER — Other Ambulatory Visit: Payer: Self-pay | Admitting: Family Medicine

## 2021-05-25 NOTE — Telephone Encounter (Signed)
Requested Prescriptions  Pending Prescriptions Disp Refills  . metoprolol tartrate (LOPRESSOR) 25 MG tablet [Pharmacy Med Name: METOPROLOL TARTRATE 25 MG TAB] 180 tablet 0    Sig: TAKE 1 TABLET BY MOUTH TWICE A DAY     Cardiovascular:  Beta Blockers Failed - 05/24/2021  9:31 AM      Failed - Last BP in normal range    BP Readings from Last 1 Encounters:  04/09/21 (!) 169/84         Passed - Last Heart Rate in normal range    Pulse Readings from Last 1 Encounters:  04/09/21 73         Passed - Valid encounter within last 6 months    Recent Outpatient Visits          5 months ago Strain of left pectoralis muscle, initial encounter   [facility], Vickki Muff, PA-C   9 months ago Annual physical exam   [facility] Jerrol Banana., MD   1 year ago Pain in left lower leg   [facility], Kelby Aline, FNP   1 year ago Constipation, unspecified constipation type   [facility] Jerrol Banana., MD   1 year ago Annual physical exam   [facility] Jerrol Banana., MD      Future Appointments            In 2 months Jerrol Banana., MD [facility], [place]

## 2021-07-24 ENCOUNTER — Other Ambulatory Visit: Payer: Self-pay | Admitting: Family Medicine

## 2021-08-06 DIAGNOSIS — M5126 Other intervertebral disc displacement, lumbar region: Secondary | ICD-10-CM | POA: Diagnosis not present

## 2021-08-06 DIAGNOSIS — M5416 Radiculopathy, lumbar region: Secondary | ICD-10-CM | POA: Diagnosis not present

## 2021-08-06 DIAGNOSIS — M5136 Other intervertebral disc degeneration, lumbar region: Secondary | ICD-10-CM | POA: Diagnosis not present

## 2021-08-06 DIAGNOSIS — M48062 Spinal stenosis, lumbar region with neurogenic claudication: Secondary | ICD-10-CM | POA: Diagnosis not present

## 2021-08-14 NOTE — Progress Notes (Signed)
Complete physical exam  I,[name],acting as a scribe for [name], MD.,have documented all relevant documentation on the behalf of [name], MD,as directed by  [name], MD while in the presence of [name], MD.   Patient: Barbara Kaiser   DOB: 02-Oct-1955   66 y.o. Female  MRN: [id number] Visit Date: 08/16/2021  Today's healthcare provider: [name], MD   Chief Complaint  Patient presents with   Annual Exam   Subjective    Barbara Kaiser is a 66 y.o. female who presents today for a complete physical exam.  She reports consuming a general diet. The patient does not participate in regular exercise at present. She generally feels fairly well.  She reports sleeping fairly well. She does not have additional problems to discuss today.  Overall patient feels well.  Past Medical History:  Diagnosis Date   Allergic rhinitis    Anemia, iron deficiency    Colon polyps 5/39   Complication of anesthesia    hard to wake   Hypertension    PONV (postoperative nausea and vomiting)    Uterine fibroid    with heavy menses and anemia (resolved by ablation)   Past Surgical History:  Procedure Laterality Date   ABDOMINAL HYSTERECTOMY     CARDIAC CATHETERIZATION  05/2005   mild CAD   DILATION AND CURETTAGE OF UTERUS  12/08   hysteroscopy and endometrial ablation   ETHMOIDECTOMY Bilateral 06/18/2019   Procedure: ETHMOIDECTOMY;  Surgeon: Leta Baptist, MD;  Location: [facility];  Service: ENT;  Laterality: Bilateral;   FRONTAL SINUS EXPLORATION Bilateral 06/18/2019   Procedure: FRONTAL SINUS EXPLORATION;  Surgeon: Leta Baptist, MD;  Location: [facility];  Service: ENT;  Laterality: Bilateral;   LAPAROSCOPIC SUPRACERVICAL HYSTERECTOMY  12/09/2011   Procedure: LAPAROSCOPIC SUPRACERVICAL HYSTERECTOMY;  Surgeon: Osborne Oman, MD;  Location: [facility];  Service: Gynecology;  Laterality: N/A;   MAXILLARY ANTROSTOMY  Bilateral 06/18/2019   Procedure: MAXILLARY ANTROSTOMY;  Surgeon: Leta Baptist, MD;  Location: [facility];  Service: ENT;  Laterality: Bilateral;   NASAL SINUS SURGERY  11/07   SALPINGOOPHORECTOMY  12/09/2011   Procedure: SALPINGO OOPHERECTOMY;  Surgeon: Osborne Oman, MD;  Location: [facility];  Service: Gynecology;  Laterality: Bilateral;   SINUS ENDO WITH FUSION Bilateral 06/18/2019   Procedure: SINUS ENDO WITH FUSION;  Surgeon: Leta Baptist, MD;  Location: [facility];  Service: ENT;  Laterality: Bilateral;   SPHENOIDECTOMY Bilateral 06/18/2019   Procedure: SPHENOIDECTOMY WITH TISSUE REMOVAL;  Surgeon: Leta Baptist, MD;  Location: [facility];  Service: ENT;  Laterality: Bilateral;   SUPRACERVICAL ABDOMINAL HYSTERECTOMY  12/09/2011   Procedure: HYSTERECTOMY SUPRACERVICAL ABDOMINAL;  Surgeon: Osborne Oman, MD;  Location: [facility];  Service: Gynecology;  Laterality: N/A;   TUBAL LIGATION     TURBINATE REDUCTION Bilateral 06/18/2019   Procedure: TURBINATE REDUCTION;  Surgeon: Leta Baptist, MD;  Location: [facility];  Service: ENT;  Laterality: Bilateral;   Social History   Socioeconomic History   Marital status: Single    Spouse name: Not on file   Number of children: 2   Years of education: Not on file   Highest education level: Not on file  Occupational History    Employer: [employer]  Tobacco Use   Smoking status: Former    Packs/day: 0.25    Years: 24.00    Pack years: 6.00    Types:  Cigarettes    Quit date: 07/01/2001    Years since quitting: 20.1   Smokeless tobacco: Never  Substance and Sexual Activity   Alcohol use: No    Alcohol/week: 0.0 standard drinks   Drug use: No   Sexual activity: Not Currently    Partners: Male    Birth control/protection: Surgical    Comment: tubalization.  Other Topics Concern   Not on file  Social History Narrative   Treadmill for exercise   Social Determinants of Health   Financial  Resource Strain: Not on file  Food Insecurity: Not on file  Transportation Needs: Not on file  Physical Activity: Not on file  Stress: Not on file  Social Connections: Not on file  Intimate Partner Violence: Not on file   Family Status  Relation Name Status   Mother  Alive   Father  Deceased       died of cirrhosis   Other Grandmother Alive   Brother  Alive   Other Grandfather Alive   Neg Hx  (Not Specified)   Family History  Problem Relation Age of Onset   Hypertension Mother    Cancer Mother 95       uterine   Hypertension Father    Heart disease Other        MI   Diabetes Other    Stroke Other    Heart disease Brother        age 83 and 67   Cancer Other        lung   Breast cancer Neg Hx    Allergies  Allergen Reactions   Codeine Nausea And Vomiting    Patient Care Team: Jerrol Banana., MD as PCP - General (Family Medicine)   Medications: Outpatient Medications Prior to Visit  Medication Sig   amLODipine (NORVASC) 5 MG tablet TAKE 1 TABLET BY MOUTH EVERY DAY   azelastine (ASTELIN) 0.1 % nasal spray 2 sprays in each nostril daily until symptoms resolve   cetirizine (ZYRTEC) 10 MG tablet Take 1 tablet (10 mg total) by mouth daily.   fluticasone (FLONASE) 50 MCG/ACT nasal spray USE 2 SPRAY INTO EACH NOSTRIL DAILY   linaclotide (LINZESS) 145 MCG CAPS capsule Take 1 capsule (145 mcg total) by mouth daily before breakfast.   metoprolol tartrate (LOPRESSOR) 25 MG tablet TAKE 1 TABLET BY MOUTH TWICE A DAY   Multiple Vitamin (MULTIVITAMIN) tablet Take 1 tablet by mouth daily.   simvastatin (ZOCOR) 20 MG tablet TAKE 1 TABLET BY MOUTH EVERY DAY   fexofenadine (ALLEGRA) 180 MG tablet Take 1 tablet (180 mg total) by mouth daily. (Patient not taking: Reported on 05/17/2020)   sodium chloride (OCEAN) 0.65 % SOLN nasal spray Place 2 sprays into both nostrils as needed for up to 14 days for congestion.   [DISCONTINUED] amoxicillin-clavulanate (AUGMENTIN) 875-125 MG  tablet Take 1 tablet by mouth 2 (two) times daily. (Patient not taking: Reported on 03/26/2021)   [DISCONTINUED] predniSONE (DELTASONE) 10 MG tablet Take 6 tablets on day one, 5 tablets on day two, 4 tablets on day three, 3 tablets on day four, 2 tablets on day five, 1 tablet on day six. Take with food. (Patient not taking: Reported on 03/26/2021)   No facility-administered medications prior to visit.    Review of Systems  Constitutional:  Positive for diaphoresis.  Gastrointestinal:  Positive for constipation.  Musculoskeletal:  Positive for back pain.  All other systems reviewed and are negative.     Objective  BP 139/70 (BP Location: Right Arm, Patient Position: Sitting, Cuff Size: Large)    Pulse 72    Temp 98 F (36.7 C) (Temporal)    Resp 16    Ht 5\' 7"  (1.702 m)    Wt 196 lb (88.9 kg)    LMP 11/28/2011    SpO2 95%    BMI 30.70 kg/m  BP Readings from Last 3 Encounters:  08/16/21 139/70  04/09/21 (!) 169/84  03/26/21 (!) 173/82   Wt Readings from Last 3 Encounters:  08/16/21 196 lb (88.9 kg)  04/09/21 199 lb (90.3 kg)  03/26/21 198 lb (89.8 kg)      Physical Exam Vitals reviewed.  Constitutional:      Appearance: Normal appearance. She is well-developed.  HENT:     Head: Normocephalic and atraumatic.     Comments: He has upper dentures and a partial lower denture.    Right Ear: External ear normal.     Left Ear: External ear normal.     Nose: Nose normal.  Eyes:     General: No scleral icterus.    Conjunctiva/sclera: Conjunctivae normal.     Pupils: Pupils are equal, round, and reactive to light.  Neck:     Thyroid: No thyromegaly.     Trachea: No tracheal deviation.     Comments: Chronic pulsatile area  in the right lower neck. Cardiovascular:     Rate and Rhythm: Normal rate and regular rhythm.     Heart sounds: Normal heart sounds. No murmur heard.   No gallop.  Pulmonary:     Effort: Pulmonary effort is normal. No respiratory distress.     Breath sounds:  Normal breath sounds. No wheezing.  Chest:  Breasts:    Right: No inverted nipple, mass or nipple discharge.     Left: No inverted nipple, mass or nipple discharge.  Abdominal:     General: There is no distension.     Palpations: Abdomen is soft.     Tenderness: There is no abdominal tenderness.  Genitourinary:    General: Normal vulva.     Labia:        Right: No rash or lesion.        Left: No rash or lesion.      Vagina: Normal. No bleeding.     Rectum: Guaiac result negative.  Musculoskeletal:        General: No tenderness.     Cervical back: Normal range of motion and neck supple.  Skin:    General: Skin is warm and dry.     Findings: No erythema or rash.  Neurological:     General: No focal deficit present.     Mental Status: She is alert and oriented to person, place, and time. Mental status is at baseline.  Psychiatric:        Mood and Affect: Mood normal.        Behavior: Behavior normal.        Thought Content: Thought content normal.        Judgment: Judgment normal.      Last depression screening scores PHQ 2/9 Scores 08/16/2021 08/15/2020 08/05/2019  PHQ - 2 Score 0 0 0  PHQ- 9 Score 0 0 1   Last fall risk screening Fall Risk  08/16/2021  Falls in the past year? 0  Number falls in past yr: 0  Injury with Fall? 0  Risk for fall due to : No Fall Risks  Follow up Falls evaluation completed  Last Audit-C alcohol use screening Alcohol Use Disorder Test (AUDIT) 08/16/2021  1. How often do you have a drink containing alcohol? 0  2. How many drinks containing alcohol do you have on a typical day when you are drinking? 0  3. How often do you have six or more drinks on one occasion? 0  AUDIT-C Score 0  Alcohol Brief Interventions/Follow-up -   A score of 3 or more in women, and 4 or more in men indicates increased risk for alcohol abuse, EXCEPT if all of the points are from question 1   No results found for any visits on 08/16/21.  Assessment & Plan    Routine  Health Maintenance and Physical Exam  Exercise Activities and Dietary recommendations  Goals   None     Immunization History  Administered Date(s) Administered   Influenza,inj,Quad PF,6+ Mos 04/20/2019, 03/28/2021   Influenza-Unspecified 03/31/2013, 03/31/2014, 03/22/2020   PFIZER(Purple Top)SARS-COV-2 Vaccination 09/05/2019, 09/28/2019   Td 03/21/2006   Tdap 08/05/2019    Health Maintenance  Topic Date Due   Zoster Vaccines- Shingrix (1 of 2) Never done   COLONOSCOPY (Pts 45-81yrs Insurance coverage will need to be confirmed)  01/29/2019   COVID-19 Vaccine (3 - Booster for Pfizer series) 11/23/2019   PAP SMEAR-Modifier  06/16/2020   MAMMOGRAM  08/31/2021   TETANUS/TDAP  08/04/2029   INFLUENZA VACCINE  Completed   Hepatitis C Screening  Completed   HIV Screening  Completed   HPV VACCINES  Aged Out    Discussed health benefits of physical activity, and encouraged her to engage in regular exercise appropriate for her age and condition.  1. Annual physical exam  - Lipid panel - TSH - CBC w/Diff/Platelet - Comprehensive Metabolic Panel (CMET)  2. Essential hypertension, benign  - Lipid panel - TSH - CBC w/Diff/Platelet - Comprehensive Metabolic Panel (CMET)  3. Other hyperlipidemia  - Lipid panel - TSH - CBC w/Diff/Platelet - Comprehensive Metabolic Panel (CMET)  4. Coronary artery disease of native artery of native heart with stable angina pectoris [facility]) Patient also followed by vascular for stable carotid stenosis less than 40% bilaterally - Lipid panel - TSH - CBC w/Diff/Platelet - Comprehensive Metabolic Panel (CMET)  5. Screening for cervical cancer Last screening Pap smear. - Cytology - PAP  6. Screening mammogram for breast cancer  - MM 3D SCREEN BREAST BILATERAL   Return in about 1 year (around 08/16/2022).     I, [name], MD, have reviewed all documentation for this visit. The documentation on 08/21/21 for the exam, diagnosis,  procedures, and orders are all accurate and complete.    Barbara Ernandez Cranford Mon, MD  [facility] [phone] (phone) [phone] (fax)  [place]

## 2021-08-16 ENCOUNTER — Other Ambulatory Visit: Payer: Self-pay

## 2021-08-16 ENCOUNTER — Ambulatory Visit (INDEPENDENT_AMBULATORY_CARE_PROVIDER_SITE_OTHER): Payer: BC Managed Care – PPO | Admitting: Family Medicine

## 2021-08-16 ENCOUNTER — Encounter: Payer: Self-pay | Admitting: Family Medicine

## 2021-08-16 ENCOUNTER — Other Ambulatory Visit (HOSPITAL_COMMUNITY)
Admission: RE | Admit: 2021-08-16 | Discharge: 2021-08-16 | Disposition: A | Discharge status: Home / Self Care | Payer: BC Managed Care – PPO | Source: Ambulatory Visit | Attending: Family Medicine | Admitting: Family Medicine

## 2021-08-16 VITALS — BP 139/70 | HR 72 | Temp 98.0°F | Resp 16 | Ht 67.0 in | Wt 196.0 lb

## 2021-08-16 DIAGNOSIS — I25118 Atherosclerotic heart disease of native coronary artery with other forms of angina pectoris: Secondary | ICD-10-CM

## 2021-08-16 DIAGNOSIS — E7849 Other hyperlipidemia: Secondary | ICD-10-CM | POA: Diagnosis not present

## 2021-08-16 DIAGNOSIS — Z1231 Encounter for screening mammogram for malignant neoplasm of breast: Secondary | ICD-10-CM

## 2021-08-16 DIAGNOSIS — Z124 Encounter for screening for malignant neoplasm of cervix: Secondary | ICD-10-CM

## 2021-08-16 DIAGNOSIS — I10 Essential (primary) hypertension: Secondary | ICD-10-CM

## 2021-08-16 DIAGNOSIS — Z0000 Encounter for general adult medical examination without abnormal findings: Secondary | ICD-10-CM | POA: Diagnosis not present

## 2021-08-21 ENCOUNTER — Other Ambulatory Visit: Payer: Self-pay

## 2021-08-21 ENCOUNTER — Ambulatory Visit: Payer: BC Managed Care – PPO

## 2021-08-21 DIAGNOSIS — E7849 Other hyperlipidemia: Secondary | ICD-10-CM

## 2021-08-21 DIAGNOSIS — I25118 Atherosclerotic heart disease of native coronary artery with other forms of angina pectoris: Secondary | ICD-10-CM

## 2021-08-21 DIAGNOSIS — Z0000 Encounter for general adult medical examination without abnormal findings: Secondary | ICD-10-CM

## 2021-08-21 NOTE — Progress Notes (Deleted)
Lab

## 2021-08-21 NOTE — Progress Notes (Deleted)
Lab work

## 2021-08-21 NOTE — Progress Notes (Unsigned)
Lab

## 2021-08-22 LAB — CBC WITH DIFFERENTIAL/PLATELET
Basophils Absolute: 0 10*3/uL (ref 0.0–0.2)
Basos: 1 %
EOS (ABSOLUTE): 0.2 10*3/uL (ref 0.0–0.4)
Eos: 4 %
Hematocrit: 45 % (ref 34.0–46.6)
Hemoglobin: 14.5 g/dL (ref 11.1–15.9)
Immature Grans (Abs): 0 10*3/uL (ref 0.0–0.1)
Immature Granulocytes: 0 %
Lymphocytes Absolute: 1.7 10*3/uL (ref 0.7–3.1)
Lymphs: 34 %
MCH: 27.8 pg (ref 26.6–33.0)
MCHC: 32.2 g/dL (ref 31.5–35.7)
MCV: 86 fL (ref 79–97)
Monocytes Absolute: 0.3 10*3/uL (ref 0.1–0.9)
Monocytes: 6 %
Neutrophils Absolute: 2.8 10*3/uL (ref 1.4–7.0)
Neutrophils: 55 %
Platelets: 264 10*3/uL (ref 150–450)
RBC: 5.21 x10E6/uL (ref 3.77–5.28)
RDW: 12.2 % (ref 11.7–15.4)
WBC: 5.1 10*3/uL (ref 3.4–10.8)

## 2021-08-22 LAB — COMPREHENSIVE METABOLIC PANEL
ALT: 15 IU/L (ref 0–32)
AST: 13 IU/L (ref 0–40)
Albumin/Globulin Ratio: 1.6 (ref 1.2–2.2)
Albumin: 4.5 g/dL (ref 3.8–4.8)
Alkaline Phosphatase: 101 IU/L (ref 44–121)
BUN/Creatinine Ratio: 24 (ref 12–28)
BUN: 17 mg/dL (ref 8–27)
Bilirubin Total: 0.4 mg/dL (ref 0.0–1.2)
CO2: 23 mmol/L (ref 20–29)
Calcium: 9.8 mg/dL (ref 8.7–10.3)
Chloride: 105 mmol/L (ref 96–106)
Creatinine, Ser: 0.72 mg/dL (ref 0.57–1.00)
Globulin, Total: 2.8 g/dL (ref 1.5–4.5)
Glucose: 109 mg/dL — ABNORMAL HIGH (ref 70–99)
Potassium: 4.4 mmol/L (ref 3.5–5.2)
Sodium: 142 mmol/L (ref 134–144)
Total Protein: 7.3 g/dL (ref 6.0–8.5)
eGFR: 94 mL/min/{1.73_m2} (ref >59)

## 2021-08-22 LAB — LIPID PANEL
Chol/HDL Ratio: 5 ratio — ABNORMAL HIGH (ref 0.0–4.4)
Cholesterol, Total: 210 mg/dL — ABNORMAL HIGH (ref 100–199)
HDL: 42 mg/dL (ref >39)
LDL Chol Calc (NIH): 147 mg/dL — ABNORMAL HIGH (ref 0–99)
Triglycerides: 118 mg/dL (ref 0–149)
VLDL Cholesterol Cal: 21 mg/dL (ref 5–40)

## 2021-08-22 LAB — TSH: TSH: 2.69 u[IU]/mL (ref 0.450–4.500)

## 2021-08-24 LAB — CYTOLOGY - PAP
Comment: NEGATIVE
Diagnosis: UNDETERMINED — AB
High risk HPV: NEGATIVE

## 2021-08-27 ENCOUNTER — Other Ambulatory Visit: Payer: Self-pay | Admitting: Family Medicine

## 2021-08-28 ENCOUNTER — Other Ambulatory Visit: Payer: Self-pay

## 2021-08-28 DIAGNOSIS — R87610 Atypical squamous cells of undetermined significance on cytologic smear of cervix (ASC-US): Secondary | ICD-10-CM

## 2021-08-28 NOTE — Telephone Encounter (Signed)
Requested Prescriptions  Pending Prescriptions Disp Refills   metoprolol tartrate (LOPRESSOR) 25 MG tablet [Pharmacy Med Name: METOPROLOL TARTRATE 25 MG TAB] 180 tablet 0    Sig: TAKE 1 TABLET BY MOUTH TWICE A DAY     Cardiovascular:  Beta Blockers Passed - 08/27/2021  1:50 AM      Passed - Last BP in normal range    BP Readings from Last 1 Encounters:  08/16/21 139/70         Passed - Last Heart Rate in normal range    Pulse Readings from Last 1 Encounters:  08/16/21 72         Passed - Valid encounter within last 6 months    Recent Outpatient Visits          1 week ago Annual physical exam   [facility] Jerrol Banana., MD   8 months ago Strain of left pectoralis muscle, initial encounter   [facility] [place]ism[place], Vickki Muff, PA-C   1 year ago Annual physical exam   [facility] Jerrol Banana., MD   1 year ago Pain in left lower leg   [facility] Flinch[place], Kelby Aline, FNP   1 year ago Constipation, unspecified constipation type   [facility] Jerrol Banana., MD      Future Appointments            In 11 months Jerrol Banana., MD [facility], [place]

## 2021-08-31 DIAGNOSIS — M48062 Spinal stenosis, lumbar region with neurogenic claudication: Secondary | ICD-10-CM | POA: Diagnosis not present

## 2021-08-31 DIAGNOSIS — M5416 Radiculopathy, lumbar region: Secondary | ICD-10-CM | POA: Diagnosis not present

## 2021-08-31 DIAGNOSIS — M5126 Other intervertebral disc displacement, lumbar region: Secondary | ICD-10-CM | POA: Diagnosis not present

## 2021-09-21 ENCOUNTER — Other Ambulatory Visit: Payer: Self-pay | Admitting: Family Medicine

## 2021-09-21 DIAGNOSIS — E7849 Other hyperlipidemia: Secondary | ICD-10-CM

## 2021-09-21 NOTE — Telephone Encounter (Incomplete)
Medication Refill - Medication: Simvastatin 20 mg ? ?Has the patient contacted their pharmacy? No.She said she didn't know to call the pharmacy  I let her know. ?(Agent: If no, request that the patient contact the pharmacy for the refill. If patient does not wish to contact the pharmacy document the reason why and proceed with request.) ?(Agent: If yes, when and what did the pharmacy advise?) ? ?Preferred Pharmacy (with phone number or street name): [place] ?Has the patient been seen for an appointment in the last year OR does the patient have an upcoming appointment? Yes.   ? ?Agent: Please be advised that RX refills may take up to 3 business days. We ask that you follow-up with your pharmacy.  ?

## 2021-09-24 NOTE — Telephone Encounter (Signed)
Medication sent to pharmacy on 09/19/21. ?

## 2021-09-25 MED ORDER — SIMVASTATIN 20 MG PO TABS: 20.0000 mg | ORAL_TABLET | Freq: Every day | ORAL | 3 refills | Status: DC

## 2021-09-25 NOTE — Telephone Encounter (Signed)
Requested Prescriptions  ?Pending Prescriptions Disp Refills  ?? simvastatin (ZOCOR) 20 MG tablet 90 tablet 3  ?  Sig: Take 1 tablet (20 mg total) by mouth daily.  ?  ? Cardiovascular:  Antilipid - Statins Failed - 09/25/2021 10:01 AM  ?  ?  Failed - Lipid Panel in normal range within the last 12 months  ?  Cholesterol, Total  ?Date Value Ref Range Status  ?08/21/2021 210 (H) 100 - 199 mg/dL Final  ? ?LDL Chol Calc (NIH)  ?Date Value Ref Range Status  ?08/21/2021 147 (H) 0 - 99 mg/dL Final  ? ?Direct LDL  ?Date Value Ref Range Status  ?01/09/2015 136.0 mg/dL Final  ?  Comment:  ?  Optimal:  <100 mg/dLNear or Above Optimal:  100-129 mg/dLBorderline High:  130-159 mg/dLHigh:  160-189 mg/dLVery High:  >190 mg/dL  ? ?HDL  ?Date Value Ref Range Status  ?08/21/2021 42 >39 mg/dL Final  ? ?Triglycerides  ?Date Value Ref Range Status  ?08/21/2021 118 0 - 149 mg/dL Final  ? ?  ?  ?  Passed - Patient is not pregnant  ?  ?  Passed - Valid encounter within last 12 months  ?  Recent Outpatient Visits   ?      ? 1 month ago Annual physical exam  ? [facility] Jerrol Banana., MD  ? 9 months ago Strain of left pectoralis muscle, initial encounter  ? [place], PA-C  ? 1 year ago Annual physical exam  ? [facility] Jerrol Banana., MD  ? 1 year ago Pain in left lower leg  ? [place], FNP  ? 2 years ago Constipation, unspecified constipation type  ? [facility] Jerrol Banana., MD  ?  ?  ?Future Appointments   ?        ? In 10 months Jerrol Banana., MD [facility]  ?  ? ?  ?  ?  ? ?

## 2021-09-25 NOTE — Telephone Encounter (Signed)
[facility] called and spoke to [place], [facility] about the refill(s) Simvastatin requested. Advised it was sent on 09/19/20 #90/3 refill(s). He says it has expired and will need a new Rx sent. ? ?

## 2021-10-01 ENCOUNTER — Encounter: Payer: Self-pay | Admitting: Obstetrics and Gynecology

## 2021-10-01 DIAGNOSIS — R87610 Atypical squamous cells of undetermined significance on cytologic smear of cervix (ASC-US): Secondary | ICD-10-CM | POA: Insufficient documentation

## 2021-10-02 ENCOUNTER — Ambulatory Visit: Payer: BC Managed Care – PPO | Admitting: Obstetrics and Gynecology

## 2021-10-08 DIAGNOSIS — M48062 Spinal stenosis, lumbar region with neurogenic claudication: Secondary | ICD-10-CM | POA: Diagnosis not present

## 2021-10-08 DIAGNOSIS — M5416 Radiculopathy, lumbar region: Secondary | ICD-10-CM | POA: Diagnosis not present

## 2021-10-08 DIAGNOSIS — M5126 Other intervertebral disc displacement, lumbar region: Secondary | ICD-10-CM | POA: Diagnosis not present

## 2021-10-08 DIAGNOSIS — M5136 Other intervertebral disc degeneration, lumbar region: Secondary | ICD-10-CM | POA: Diagnosis not present

## 2021-10-15 ENCOUNTER — Ambulatory Visit
Admission: RE | Admit: 2021-10-15 | Discharge: 2021-10-15 | Disposition: A | Discharge status: Home / Self Care | Payer: BC Managed Care – PPO | Source: Ambulatory Visit | Attending: Family Medicine | Admitting: Family Medicine

## 2021-10-15 DIAGNOSIS — Z1231 Encounter for screening mammogram for malignant neoplasm of breast: Secondary | ICD-10-CM | POA: Diagnosis not present

## 2021-11-13 DIAGNOSIS — M48062 Spinal stenosis, lumbar region with neurogenic claudication: Secondary | ICD-10-CM | POA: Diagnosis not present

## 2021-11-13 DIAGNOSIS — M5416 Radiculopathy, lumbar region: Secondary | ICD-10-CM | POA: Diagnosis not present

## 2021-11-13 DIAGNOSIS — M5126 Other intervertebral disc displacement, lumbar region: Secondary | ICD-10-CM | POA: Diagnosis not present

## 2021-11-13 DIAGNOSIS — M5136 Other intervertebral disc degeneration, lumbar region: Secondary | ICD-10-CM | POA: Diagnosis not present

## 2021-11-30 ENCOUNTER — Other Ambulatory Visit: Payer: Self-pay | Admitting: Family Medicine

## 2021-12-10 ENCOUNTER — Other Ambulatory Visit: Payer: Self-pay | Admitting: Family Medicine

## 2021-12-10 DIAGNOSIS — M5416 Radiculopathy, lumbar region: Secondary | ICD-10-CM

## 2021-12-12 ENCOUNTER — Ambulatory Visit
Admission: RE | Admit: 2021-12-12 | Discharge: 2021-12-12 | Disposition: A | Discharge status: Home / Self Care | Payer: BC Managed Care – PPO | Source: Ambulatory Visit | Attending: Family Medicine | Admitting: Family Medicine

## 2021-12-12 DIAGNOSIS — M48061 Spinal stenosis, lumbar region without neurogenic claudication: Secondary | ICD-10-CM | POA: Diagnosis not present

## 2021-12-12 DIAGNOSIS — M5416 Radiculopathy, lumbar region: Secondary | ICD-10-CM | POA: Diagnosis not present

## 2021-12-12 DIAGNOSIS — M4316 Spondylolisthesis, lumbar region: Secondary | ICD-10-CM | POA: Diagnosis not present

## 2021-12-12 DIAGNOSIS — R200 Anesthesia of skin: Secondary | ICD-10-CM | POA: Diagnosis not present

## 2021-12-12 DIAGNOSIS — M545 Low back pain, unspecified: Secondary | ICD-10-CM | POA: Diagnosis not present

## 2021-12-25 ENCOUNTER — Other Ambulatory Visit: Payer: Self-pay | Admitting: Family Medicine

## 2022-01-08 DIAGNOSIS — M48062 Spinal stenosis, lumbar region with neurogenic claudication: Secondary | ICD-10-CM | POA: Diagnosis not present

## 2022-01-08 DIAGNOSIS — Z6831 Body mass index (BMI) 31.0-31.9, adult: Secondary | ICD-10-CM | POA: Diagnosis not present

## 2022-01-08 DIAGNOSIS — M4316 Spondylolisthesis, lumbar region: Secondary | ICD-10-CM | POA: Diagnosis not present

## 2022-01-15 ENCOUNTER — Other Ambulatory Visit: Payer: Self-pay | Admitting: Neurosurgery

## 2022-01-31 ENCOUNTER — Other Ambulatory Visit: Payer: Self-pay | Admitting: Neurosurgery

## 2022-02-11 NOTE — Pre-Procedure Instructions (Signed)
Surgical Instructions    Your procedure is scheduled on February 21, 2022.  Report to [facility] Main Entrance "A" at 8:30 A.M., then check in with the Admitting office.  Call this number if you have problems the morning of surgery:  [phone]   If you have any questions prior to your surgery date call [phone]: Open Monday-Friday 8am-4pm    Remember:  Do not eat or drink after midnight the night before your surgery     Take these medicines the morning of surgery with A SIP OF WATER:  fluticasone (FLONASE)   metoprolol tartrate (LOPRESSOR)   simvastatin (ZOCOR)  acetaminophen (TYLENOL) - may take as needed   As of today, STOP taking any Aspirin (unless otherwise instructed by your surgeon) Aleve, Naproxen, Ibuprofen, Motrin, Advil, Goody's, BC's, all herbal medications, fish oil, and all vitamins.                     Do NOT Smoke (Tobacco/Vaping) for 24 hours prior to your procedure.  If you use a CPAP at night, you may bring your mask/headgear for your overnight stay.   Contacts, glasses, piercing's, hearing aid's, dentures or partials may not be worn into surgery, please bring cases for these belongings.    For patients admitted to the hospital, discharge time will be determined by your treatment team.   Patients discharged the day of surgery will not be allowed to drive home, and someone needs to stay with them for 24 hours.  SURGICAL WAITING ROOM VISITATION Patients having surgery or a procedure may have no more than 2 support people in the waiting area - these visitors may rotate.   Children under the age of 4 must have an adult with them who is not the patient. If the patient needs to stay at the hospital during part of their recovery, the visitor guidelines for inpatient rooms apply. Pre-op nurse will coordinate an appropriate time for 1 support person to accompany patient in pre-op.  This support person may not rotate.   Please refer to the [facility] website  for the visitor guidelines for Inpatients (after your surgery is over and you are in a regular room).    Special instructions:   [place]- Preparing For Surgery  Before surgery, you can play an important role. Because skin is not sterile, your skin needs to be as free of germs as possible. You can reduce the number of germs on your skin by washing with CHG (chlorahexidine gluconate) Soap before surgery.  CHG is an antiseptic cleaner which kills germs and bonds with the skin to continue killing germs even after washing.    Oral Hygiene is also important to reduce your risk of infection.  Remember - BRUSH YOUR TEETH THE MORNING OF SURGERY WITH YOUR REGULAR TOOTHPASTE  Please do not use if you have an allergy to CHG or antibacterial soaps. If your skin becomes reddened/irritated stop using the CHG.  Do not shave (including legs and underarms) for at least 48 hours prior to first CHG shower. It is OK to shave your face.  Please follow these instructions carefully.   Shower the NIGHT BEFORE SURGERY and the MORNING OF SURGERY  If you chose to wash your hair, wash your hair first as usual with your normal shampoo.  After you shampoo, rinse your hair and body thoroughly to remove the shampoo.  Use CHG Soap as you would any other liquid soap. You can apply CHG directly to the skin and  wash gently with a scrungie or a clean washcloth.   Apply the CHG Soap to your body ONLY FROM THE NECK DOWN.  Do not use on open wounds or open sores. Avoid contact with your eyes, ears, mouth and genitals (private parts). Wash Face and genitals (private parts)  with your normal soap.   Wash thoroughly, paying special attention to the area where your surgery will be performed.  Thoroughly rinse your body with warm water from the neck down.  DO NOT shower/wash with your normal soap after using and rinsing off the CHG Soap.  Pat yourself dry with a CLEAN TOWEL.  Wear CLEAN PAJAMAS to bed the night before  surgery  Place CLEAN SHEETS on your bed the night before your surgery  DO NOT SLEEP WITH PETS.   Day of Surgery: Take a shower with CHG soap. Do not wear jewelry or makeup Do not wear lotions, powders, perfumes/colognes, or deodorant. Do not shave 48 hours prior to surgery.   Do not bring valuables to the hospital.  [facility] is not responsible for any belongings or valuables. Do not wear nail polish, gel polish, artificial nails, or any other type of covering on natural nails (fingers and toes) If you have artificial nails or gel coating that need to be removed by a nail salon, please have this removed prior to surgery. Artificial nails or gel coating may interfere with anesthesia's ability to adequately monitor your vital signs.  Wear Clean/Comfortable clothing the morning of surgery Remember to brush your teeth WITH YOUR REGULAR TOOTHPASTE.   Please read over the following fact sheets that you were given.    If you received a COVID test during your pre-op visit  it is requested that you wear a mask when out in public, stay away from anyone that may not be feeling well and notify your surgeon if you develop symptoms. If you have been in contact with anyone that has tested positive in the last 10 days please notify you surgeon.

## 2022-02-12 ENCOUNTER — Other Ambulatory Visit: Payer: Self-pay

## 2022-02-12 ENCOUNTER — Encounter (HOSPITAL_COMMUNITY): Payer: Self-pay

## 2022-02-12 ENCOUNTER — Encounter (HOSPITAL_COMMUNITY)
Admission: RE | Admit: 2022-02-12 | Discharge: 2022-02-12 | Disposition: A | Discharge status: Home / Self Care | Payer: BC Managed Care – PPO | Source: Ambulatory Visit | Attending: Neurosurgery | Admitting: Neurosurgery

## 2022-02-12 VITALS — BP 167/76 | HR 66 | Temp 98.1°F | Resp 17 | Ht 67.0 in | Wt 199.6 lb

## 2022-02-12 DIAGNOSIS — Z01818 Encounter for other preprocedural examination: Secondary | ICD-10-CM | POA: Insufficient documentation

## 2022-02-12 LAB — CBC
HCT: 44.6 % (ref 36.0–46.0)
Hemoglobin: 14.3 g/dL (ref 12.0–15.0)
MCH: 28.7 pg (ref 26.0–34.0)
MCHC: 32.1 g/dL (ref 30.0–36.0)
MCV: 89.6 fL (ref 80.0–100.0)
Platelets: 261 10*3/uL (ref 150–400)
RBC: 4.98 MIL/uL (ref 3.87–5.11)
RDW: 12.6 % (ref 11.5–15.5)
WBC: 7.6 10*3/uL (ref 4.0–10.5)
nRBC: 0 % (ref 0.0–0.2)

## 2022-02-12 LAB — BASIC METABOLIC PANEL
Anion gap: 7 (ref 5–15)
BUN: 14 mg/dL (ref 8–23)
CO2: 27 mmol/L (ref 22–32)
Calcium: 9.6 mg/dL (ref 8.9–10.3)
Chloride: 108 mmol/L (ref 98–111)
Creatinine, Ser: 0.73 mg/dL (ref 0.44–1.00)
GFR, Estimated: >60 mL/min (ref >60)
Glucose, Bld: 98 mg/dL (ref 70–99)
Potassium: 3.6 mmol/L (ref 3.5–5.1)
Sodium: 142 mmol/L (ref 135–145)

## 2022-02-12 LAB — TYPE AND SCREEN
ABO/RH(D): O POS
Antibody Screen: NEGATIVE

## 2022-02-12 LAB — SURGICAL PCR SCREEN
MRSA, PCR: NEGATIVE
Staphylococcus aureus: NEGATIVE

## 2022-02-12 NOTE — Progress Notes (Signed)
PCP - Raynald Kemp Cardiologist - none  PPM/ICD - Denies Device Orders -  Rep Notified -   Chest x-ray - na EKG - 02/12/22 Stress Test - none ECHO - none Cardiac Cath - 05/2005  Sleep Study - none CPAP - no  Fasting Blood Sugar - na Checks Blood Sugar _____ times a day  Blood Thinner Instructions:na Aspirin Instructions:na  ERAS Protcol -no PRE-SURGERY Ensure or G2-   COVID TEST- na   Anesthesia review: no  Patient denies shortness of breath, fever, cough and chest pain at PAT appointment   All instructions explained to the patient, with a verbal understanding of the material. Patient agrees to go over the instructions while at home for a better understanding. Patient also instructed to wear a mask when out in public prior to surgery. The opportunity to ask questions was provided.

## 2022-02-15 ENCOUNTER — Other Ambulatory Visit: Payer: Self-pay | Admitting: Family Medicine

## 2022-02-15 NOTE — Telephone Encounter (Signed)
Requested Prescriptions  Pending Prescriptions Disp Refills  . fluticasone (FLONASE) 50 MCG/ACT nasal spray [Pharmacy Med Name: FLUTICASONE PROP 50 MCG SPRAY] 48 mL 3    Sig: USE 2 SPRAY INTO EACH NOSTRIL DAILY     Ear, Nose, and Throat: Nasal Preparations - Corticosteroids Passed - 02/15/2022  2:29 PM      Passed - Valid encounter within last 12 months    Recent Outpatient Visits          6 months ago Annual physical exam   [facility] Jerrol Banana., MD   1 year ago Strain of left pectoralis muscle, initial encounter   [place], Vickki Muff, PA-C   1 year ago Annual physical exam   [facility] Jerrol Banana., MD   1 year ago Pain in left lower leg   [facility], Kelby Aline, FNP   2 years ago Constipation, unspecified constipation type   [facility] Jerrol Banana., MD      Future Appointments            In 6 months Jerrol Banana., MD [facility], [place]

## 2022-02-21 ENCOUNTER — Ambulatory Visit (HOSPITAL_COMMUNITY): Payer: BC Managed Care – PPO | Admitting: Certified Registered"

## 2022-02-21 ENCOUNTER — Ambulatory Visit (HOSPITAL_COMMUNITY): Payer: BC Managed Care – PPO

## 2022-02-21 ENCOUNTER — Ambulatory Visit (HOSPITAL_COMMUNITY)
Admission: RE | Admit: 2022-02-21 | Discharge: 2022-02-22 | Disposition: A | Discharge status: Home / Self Care | Payer: BC Managed Care – PPO | Attending: Neurosurgery | Admitting: Neurosurgery

## 2022-02-21 ENCOUNTER — Other Ambulatory Visit: Payer: Self-pay

## 2022-02-21 ENCOUNTER — Encounter (HOSPITAL_COMMUNITY): Payer: Self-pay | Admitting: Neurosurgery

## 2022-02-21 ENCOUNTER — Encounter (HOSPITAL_COMMUNITY)
Admission: RE | Disposition: A | Discharge status: Home / Self Care | Payer: Self-pay | Source: Home / Self Care | Attending: Neurosurgery

## 2022-02-21 DIAGNOSIS — Z87891 Personal history of nicotine dependence: Secondary | ICD-10-CM | POA: Diagnosis not present

## 2022-02-21 DIAGNOSIS — I10 Essential (primary) hypertension: Secondary | ICD-10-CM | POA: Diagnosis not present

## 2022-02-21 DIAGNOSIS — M48062 Spinal stenosis, lumbar region with neurogenic claudication: Secondary | ICD-10-CM | POA: Insufficient documentation

## 2022-02-21 DIAGNOSIS — M4326 Fusion of spine, lumbar region: Secondary | ICD-10-CM | POA: Diagnosis not present

## 2022-02-21 DIAGNOSIS — M5116 Intervertebral disc disorders with radiculopathy, lumbar region: Secondary | ICD-10-CM | POA: Insufficient documentation

## 2022-02-21 DIAGNOSIS — I2510 Atherosclerotic heart disease of native coronary artery without angina pectoris: Secondary | ICD-10-CM | POA: Insufficient documentation

## 2022-02-21 DIAGNOSIS — M5416 Radiculopathy, lumbar region: Secondary | ICD-10-CM | POA: Diagnosis not present

## 2022-02-21 DIAGNOSIS — M4316 Spondylolisthesis, lumbar region: Secondary | ICD-10-CM | POA: Diagnosis not present

## 2022-02-21 SURGERY — POSTERIOR LUMBAR FUSION 1 LEVEL
Anesthesia: General | Site: Spine Lumbar

## 2022-02-21 MED ORDER — LIDOCAINE 2% (20 MG/ML) 5 ML SYRINGE
INTRAMUSCULAR | Status: AC
  Filled 2022-02-21: qty 5

## 2022-02-21 MED ORDER — HYDROMORPHONE HCL 1 MG/ML IJ SOLN
INTRAMUSCULAR | Status: AC
  Filled 2022-02-21: qty 1

## 2022-02-21 MED ORDER — ACETAMINOPHEN 325 MG PO TABS: 650.0000 mg | ORAL_TABLET | ORAL | Status: DC | PRN

## 2022-02-21 MED ORDER — SUFENTANIL CITRATE 50 MCG/ML IV SOLN
INTRAVENOUS | Status: DC | PRN
  Administered 2022-02-21 (×2): 10 ug via INTRAVENOUS

## 2022-02-21 MED ORDER — LACTATED RINGERS IV SOLN: INTRAVENOUS | Status: DC | PRN

## 2022-02-21 MED ORDER — ROCURONIUM 10MG/ML (10ML) SYRINGE FOR MEDFUSION PUMP - OPTIME
INTRAVENOUS | Status: DC | PRN
  Administered 2022-02-21: 100 mg via INTRAVENOUS

## 2022-02-21 MED ORDER — SIMVASTATIN 20 MG PO TABS
20.0000 mg | ORAL_TABLET | Freq: Every day | ORAL | Status: DC
  Administered 2022-02-21 – 2022-02-22 (×2): 20 mg via ORAL
  Filled 2022-02-21 (×2): qty 1

## 2022-02-21 MED ORDER — LIDOCAINE 2% (20 MG/ML) 5 ML SYRINGE
INTRAMUSCULAR | Status: DC | PRN
  Administered 2022-02-21: 100 mg via INTRAVENOUS

## 2022-02-21 MED ORDER — MIDAZOLAM HCL 2 MG/2ML IJ SOLN
INTRAMUSCULAR | Status: DC | PRN
  Administered 2022-02-21: 2 mg via INTRAVENOUS

## 2022-02-21 MED ORDER — ONDANSETRON HCL 4 MG/2ML IJ SOLN
INTRAMUSCULAR | Status: AC
  Filled 2022-02-21: qty 2

## 2022-02-21 MED ORDER — CEFAZOLIN SODIUM-DEXTROSE 2-4 GM/100ML-% IV SOLN
INTRAVENOUS | Status: AC
  Filled 2022-02-21: qty 100

## 2022-02-21 MED ORDER — LACTATED RINGERS IV SOLN: INTRAVENOUS | Status: DC

## 2022-02-21 MED ORDER — CHLORHEXIDINE GLUCONATE CLOTH 2 % EX PADS: 6.0000 | MEDICATED_PAD | Freq: Once | CUTANEOUS | Status: DC

## 2022-02-21 MED ORDER — CYCLOBENZAPRINE HCL 10 MG PO TABS: 10.0000 mg | ORAL_TABLET | Freq: Three times a day (TID) | ORAL | Status: DC | PRN

## 2022-02-21 MED ORDER — PHENOL 1.4 % MT LIQD: 1.0000 | OROMUCOSAL | Status: DC | PRN

## 2022-02-21 MED ORDER — DEXAMETHASONE SODIUM PHOSPHATE 10 MG/ML IJ SOLN
INTRAMUSCULAR | Status: DC | PRN
  Administered 2022-02-21: 10 mg via INTRAVENOUS

## 2022-02-21 MED ORDER — GLYCOPYRROLATE PF 0.2 MG/ML IJ SOSY
PREFILLED_SYRINGE | INTRAMUSCULAR | Status: AC
  Filled 2022-02-21: qty 1

## 2022-02-21 MED ORDER — SODIUM CHLORIDE (PF) 0.9 % IJ SOLN
INTRAMUSCULAR | Status: AC
  Filled 2022-02-21: qty 10

## 2022-02-21 MED ORDER — GLYCOPYRROLATE 0.2 MG/ML IJ SOLN
INTRAMUSCULAR | Status: DC | PRN
  Administered 2022-02-21: .2 mg via INTRAVENOUS

## 2022-02-21 MED ORDER — ACETAMINOPHEN 500 MG PO TABS
1000.0000 mg | ORAL_TABLET | Freq: Four times a day (QID) | ORAL | Status: AC
  Administered 2022-02-21 – 2022-02-22 (×4): 1000 mg via ORAL
  Filled 2022-02-21 (×4): qty 2

## 2022-02-21 MED ORDER — THROMBIN 5000 UNITS EX SOLR: OROMUCOSAL | Status: DC | PRN

## 2022-02-21 MED ORDER — CEFAZOLIN SODIUM-DEXTROSE 2-4 GM/100ML-% IV SOLN
2.0000 g | Freq: Three times a day (TID) | INTRAVENOUS | Status: AC
  Administered 2022-02-21 (×2): 2 g via INTRAVENOUS
  Filled 2022-02-21 (×2): qty 100

## 2022-02-21 MED ORDER — ZOLPIDEM TARTRATE 5 MG PO TABS: 5.0000 mg | ORAL_TABLET | Freq: Every evening | ORAL | Status: DC | PRN

## 2022-02-21 MED ORDER — HYDROMORPHONE HCL 1 MG/ML IJ SOLN
0.2500 mg | INTRAMUSCULAR | Status: DC | PRN
  Administered 2022-02-21 (×2): 0.5 mg via INTRAVENOUS

## 2022-02-21 MED ORDER — DOCUSATE SODIUM 100 MG PO CAPS
100.0000 mg | ORAL_CAPSULE | Freq: Two times a day (BID) | ORAL | Status: DC
  Administered 2022-02-21 – 2022-02-22 (×3): 100 mg via ORAL
  Filled 2022-02-21 (×3): qty 1

## 2022-02-21 MED ORDER — PHENYLEPHRINE 80 MCG/ML (10ML) SYRINGE FOR IV PUSH (FOR BLOOD PRESSURE SUPPORT)
PREFILLED_SYRINGE | INTRAVENOUS | Status: AC
  Filled 2022-02-21: qty 10

## 2022-02-21 MED ORDER — SODIUM CHLORIDE 0.9% FLUSH: 3.0000 mL | INTRAVENOUS | Status: DC | PRN

## 2022-02-21 MED ORDER — DEXAMETHASONE SODIUM PHOSPHATE 10 MG/ML IJ SOLN
INTRAMUSCULAR | Status: AC
  Filled 2022-02-21: qty 1

## 2022-02-21 MED ORDER — CEFAZOLIN SODIUM-DEXTROSE 2-4 GM/100ML-% IV SOLN
2.0000 g | INTRAVENOUS | Status: AC
  Administered 2022-02-21: 2 g via INTRAVENOUS
  Filled 2022-02-21: qty 100

## 2022-02-21 MED ORDER — ORAL CARE MOUTH RINSE: 15.0000 mL | Freq: Once | OROMUCOSAL | Status: AC

## 2022-02-21 MED ORDER — MORPHINE SULFATE (PF) 4 MG/ML IV SOLN: 4.0000 mg | INTRAVENOUS | Status: DC | PRN

## 2022-02-21 MED ORDER — ONDANSETRON HCL 4 MG/2ML IJ SOLN
INTRAMUSCULAR | Status: DC | PRN
  Administered 2022-02-21: 4 mg via INTRAVENOUS

## 2022-02-21 MED ORDER — SODIUM CHLORIDE 0.9% FLUSH
3.0000 mL | Freq: Two times a day (BID) | INTRAVENOUS | Status: DC
  Administered 2022-02-21: 3 mL via INTRAVENOUS

## 2022-02-21 MED ORDER — OXYCODONE HCL 5 MG PO TABS: 5.0000 mg | ORAL_TABLET | ORAL | Status: DC | PRN

## 2022-02-21 MED ORDER — SUFENTANIL CITRATE 50 MCG/ML IV SOLN
INTRAVENOUS | Status: AC
  Filled 2022-02-21: qty 1

## 2022-02-21 MED ORDER — FLUTICASONE PROPIONATE 50 MCG/ACT NA SUSP
1.0000 | Freq: Every day | NASAL | Status: DC
  Filled 2022-02-21: qty 16

## 2022-02-21 MED ORDER — BACITRACIN ZINC 500 UNIT/GM EX OINT
TOPICAL_OINTMENT | CUTANEOUS | Status: AC
  Filled 2022-02-21: qty 28.35

## 2022-02-21 MED ORDER — MIDAZOLAM HCL 2 MG/2ML IJ SOLN
INTRAMUSCULAR | Status: AC
Start: 2022-02-21 — End: ?
  Filled 2022-02-21: qty 2

## 2022-02-21 MED ORDER — HYDROXYZINE HCL 50 MG/ML IM SOLN
50.0000 mg | Freq: Four times a day (QID) | INTRAMUSCULAR | Status: DC | PRN
  Administered 2022-02-21: 50 mg via INTRAMUSCULAR
  Filled 2022-02-21: qty 1

## 2022-02-21 MED ORDER — LINACLOTIDE 145 MCG PO CAPS
145.0000 ug | ORAL_CAPSULE | Freq: Every day | ORAL | Status: DC
  Administered 2022-02-22: 145 ug via ORAL
  Filled 2022-02-21: qty 1

## 2022-02-21 MED ORDER — SODIUM CHLORIDE 0.9 % IV SOLN
250.0000 mL | INTRAVENOUS | Status: DC
Start: 2022-02-21 — End: 2022-02-22
  Administered 2022-02-21: 250 mL via INTRAVENOUS

## 2022-02-21 MED ORDER — AMLODIPINE BESYLATE 5 MG PO TABS
5.0000 mg | ORAL_TABLET | Freq: Every day | ORAL | Status: DC
  Administered 2022-02-21 – 2022-02-22 (×2): 5 mg via ORAL
  Filled 2022-02-21 (×2): qty 1

## 2022-02-21 MED ORDER — ONDANSETRON HCL 4 MG/2ML IJ SOLN: 4.0000 mg | Freq: Four times a day (QID) | INTRAMUSCULAR | Status: DC | PRN

## 2022-02-21 MED ORDER — PROPOFOL 10 MG/ML IV BOLUS
INTRAVENOUS | Status: AC
  Filled 2022-02-21: qty 20

## 2022-02-21 MED ORDER — BUPIVACAINE-EPINEPHRINE (PF) 0.5% -1:200000 IJ SOLN
INTRAMUSCULAR | Status: DC | PRN
  Administered 2022-02-21: 10 mL

## 2022-02-21 MED ORDER — PROPOFOL 10 MG/ML IV BOLUS
INTRAVENOUS | Status: DC | PRN
  Administered 2022-02-21: 160 mg via INTRAVENOUS

## 2022-02-21 MED ORDER — PHENYLEPHRINE HCL-NACL 20-0.9 MG/250ML-% IV SOLN
INTRAVENOUS | Status: DC | PRN
  Administered 2022-02-21: 25 ug/min via INTRAVENOUS

## 2022-02-21 MED ORDER — ROCURONIUM BROMIDE 10 MG/ML (PF) SYRINGE
PREFILLED_SYRINGE | INTRAVENOUS | Status: AC
  Filled 2022-02-21: qty 10

## 2022-02-21 MED ORDER — BACITRACIN ZINC 500 UNIT/GM EX OINT
TOPICAL_OINTMENT | CUTANEOUS | Status: DC | PRN
  Administered 2022-02-21: 1 via TOPICAL

## 2022-02-21 MED ORDER — BUPIVACAINE LIPOSOME 1.3 % IJ SUSP
INTRAMUSCULAR | Status: DC | PRN
  Administered 2022-02-21: 20 mL

## 2022-02-21 MED ORDER — ACETAMINOPHEN 650 MG RE SUPP: 650.0000 mg | RECTAL | Status: DC | PRN

## 2022-02-21 MED ORDER — METOPROLOL TARTRATE 25 MG PO TABS
25.0000 mg | ORAL_TABLET | Freq: Two times a day (BID) | ORAL | Status: DC
  Administered 2022-02-21: 25 mg via ORAL
  Filled 2022-02-21: qty 1

## 2022-02-21 MED ORDER — OXYCODONE HCL 5 MG PO TABS
10.0000 mg | ORAL_TABLET | ORAL | Status: DC | PRN
  Administered 2022-02-21 – 2022-02-22 (×4): 10 mg via ORAL
  Filled 2022-02-21 (×4): qty 2

## 2022-02-21 MED ORDER — 0.9 % SODIUM CHLORIDE (POUR BTL) OPTIME
TOPICAL | Status: DC | PRN
  Administered 2022-02-21: 1000 mL

## 2022-02-21 MED ORDER — CHLORHEXIDINE GLUCONATE 0.12 % MT SOLN
OROMUCOSAL | Status: AC
  Administered 2022-02-21: 15 mL via OROMUCOSAL
  Filled 2022-02-21: qty 15

## 2022-02-21 MED ORDER — MENTHOL 3 MG MT LOZG
1.0000 | LOZENGE | OROMUCOSAL | Status: DC | PRN
Start: 2022-02-21 — End: 2022-02-22

## 2022-02-21 MED ORDER — BISACODYL 10 MG RE SUPP: 10.0000 mg | Freq: Every day | RECTAL | Status: DC | PRN

## 2022-02-21 MED ORDER — BUPIVACAINE LIPOSOME 1.3 % IJ SUSP
INTRAMUSCULAR | Status: AC
  Filled 2022-02-21: qty 20

## 2022-02-21 MED ORDER — ONDANSETRON HCL 4 MG PO TABS: 4.0000 mg | ORAL_TABLET | Freq: Four times a day (QID) | ORAL | Status: DC | PRN

## 2022-02-21 MED ORDER — THROMBIN 5000 UNITS EX SOLR
CUTANEOUS | Status: AC
  Filled 2022-02-21: qty 5000

## 2022-02-21 MED ORDER — SUGAMMADEX SODIUM 200 MG/2ML IV SOLN
INTRAVENOUS | Status: DC | PRN
  Administered 2022-02-21: 200 mg via INTRAVENOUS

## 2022-02-21 MED ORDER — CHLORHEXIDINE GLUCONATE 0.12 % MT SOLN
15.0000 mL | Freq: Once | OROMUCOSAL | Status: AC
  Filled 2022-02-21: qty 15

## 2022-02-21 MED ORDER — BUPIVACAINE-EPINEPHRINE (PF) 0.5% -1:200000 IJ SOLN
INTRAMUSCULAR | Status: AC
Start: 2022-02-21 — End: ?
  Filled 2022-02-21: qty 30

## 2022-02-21 MED ORDER — PHENYLEPHRINE HCL (PRESSORS) 10 MG/ML IV SOLN
INTRAVENOUS | Status: DC | PRN
  Administered 2022-02-21: 80 ug via INTRAVENOUS

## 2022-02-21 SURGICAL SUPPLY — 59 items
BAG COUNTER SPONGE SURGICOUNT (BAG) ×1 IMPLANT
BASKET BONE COLLECTION (BASKET) ×1 IMPLANT
BENZOIN TINCTURE PRP APPL 2/3 (GAUZE/BANDAGES/DRESSINGS) ×1 IMPLANT
BUR MATCHSTICK NEURO 3.0 LAGG (BURR) ×1 IMPLANT
BUR PRECISION FLUTE 6.0 (BURR) ×1 IMPLANT
CANISTER SUCT 3000ML PPV (MISCELLANEOUS) ×1 IMPLANT
CAP LOCK DLX THRD (Cap) IMPLANT
CARTRIDGE OIL MAESTRO DRILL (MISCELLANEOUS) ×1 IMPLANT
CNTNR URN SCR LID CUP LEK RST (MISCELLANEOUS) ×1 IMPLANT
CONT SPEC 4OZ STRL OR WHT (MISCELLANEOUS) ×1
COVER BACK TABLE 60X90IN (DRAPES) ×1 IMPLANT
DIFFUSER DRILL AIR PNEUMATIC (MISCELLANEOUS) ×1 IMPLANT
DRAPE C-ARM 42X72 X-RAY (DRAPES) ×2 IMPLANT
DRAPE HALF SHEET 40X57 (DRAPES) ×1 IMPLANT
DRAPE LAPAROTOMY 100X72X124 (DRAPES) ×1 IMPLANT
DRAPE SURG 17X23 STRL (DRAPES) ×4 IMPLANT
DRSG OPSITE POSTOP 4X6 (GAUZE/BANDAGES/DRESSINGS) ×1 IMPLANT
ELECT BLADE 4.0 EZ CLEAN MEGAD (MISCELLANEOUS) ×1
ELECT REM PT RETURN 9FT ADLT (ELECTROSURGICAL) ×1
ELECTRODE BLDE 4.0 EZ CLN MEGD (MISCELLANEOUS) ×1 IMPLANT
ELECTRODE REM PT RTRN 9FT ADLT (ELECTROSURGICAL) ×1 IMPLANT
EVACUATOR 1/8 PVC DRAIN (DRAIN) IMPLANT
GLOVE BIO SURGEON STRL SZ 6 (GLOVE) ×1 IMPLANT
GLOVE BIO SURGEON STRL SZ8 (GLOVE) ×2 IMPLANT
GLOVE BIO SURGEON STRL SZ8.5 (GLOVE) ×2 IMPLANT
GLOVE BIOGEL PI IND STRL 6.5 (GLOVE) ×1 IMPLANT
GLOVE BIOGEL PI INDICATOR 6.5 (GLOVE) ×2
GOWN STRL REUS W/ TWL LRG LVL3 (GOWN DISPOSABLE) ×1 IMPLANT
GOWN STRL REUS W/ TWL XL LVL3 (GOWN DISPOSABLE) ×2 IMPLANT
GOWN STRL REUS W/TWL 2XL LVL3 (GOWN DISPOSABLE) IMPLANT
GOWN STRL REUS W/TWL LRG LVL3 (GOWN DISPOSABLE) ×3
GOWN STRL REUS W/TWL XL LVL3 (GOWN DISPOSABLE) ×2
HEMOSTAT POWDER KIT SURGIFOAM (HEMOSTASIS) ×1 IMPLANT
KIT BASIN OR (CUSTOM PROCEDURE TRAY) ×1 IMPLANT
KIT GRAFTMAG DEL NEURO DISP (NEUROSURGERY SUPPLIES) IMPLANT
KIT TURNOVER KIT B (KITS) ×1 IMPLANT
NDL HYPO 21X1.5 SAFETY (NEEDLE) IMPLANT
NEEDLE HYPO 21X1.5 SAFETY (NEEDLE) IMPLANT
NEEDLE HYPO 22GX1.5 SAFETY (NEEDLE) ×1 IMPLANT
NS IRRIG 1000ML POUR BTL (IV SOLUTION) ×1 IMPLANT
OIL CARTRIDGE MAESTRO DRILL (MISCELLANEOUS) ×1
PACK LAMINECTOMY NEURO (CUSTOM PROCEDURE TRAY) ×1 IMPLANT
PAD ARMBOARD 7.5X6 YLW CONV (MISCELLANEOUS) ×3 IMPLANT
PATTIES SURGICAL .5 X1 (DISPOSABLE) IMPLANT
PUTTY DBM 5CC CALC GRAN (Putty) IMPLANT
ROD CREO DLX CVD 6.35X40 (Rod) IMPLANT
ROD CURVED TI 6.35X40 (Rod) ×2 IMPLANT
SCREW PA DLX CREO 7.5X50 (Screw) IMPLANT
SPACER ALTERA 10X31 8-12MM-8 (Spacer) IMPLANT
STRIP CLOSURE SKIN 1/2X4 (GAUZE/BANDAGES/DRESSINGS) ×1 IMPLANT
SUT VIC AB 1 CT1 18XBRD ANBCTR (SUTURE) ×2 IMPLANT
SUT VIC AB 1 CT1 8-18 (SUTURE) ×2
SUT VIC AB 2-0 CP2 18 (SUTURE) ×2 IMPLANT
SYR 20ML LL LF (SYRINGE) IMPLANT
TAP SURG GLBU 6.5X40 (ORTHOPEDIC DISPOSABLE SUPPLIES) IMPLANT
TOWEL GREEN STERILE (TOWEL DISPOSABLE) ×1 IMPLANT
TOWEL GREEN STERILE FF (TOWEL DISPOSABLE) ×1 IMPLANT
TRAY FOLEY MTR SLVR 16FR STAT (SET/KITS/TRAYS/PACK) ×1 IMPLANT
WATER STERILE IRR 1000ML POUR (IV SOLUTION) ×1 IMPLANT

## 2022-02-21 NOTE — H&P (Signed)
Subjective: The patient is a 66 year old black female who has complained of back and right greater left leg pain consistent with neurogenic claudication.  She has failed medical management and was worked up with lumbar x-rays and lumbar MRI which demonstrated L4-5 spinal listhesis with spinal stenosis.  I discussed the various treatment options with her.  She has decided proceed with surgery.  Past Medical History:  Diagnosis Date   Allergic rhinitis    Anemia, iron deficiency    Colon polyps 7/51   Complication of anesthesia    hard to wake   Hypertension    PONV (postoperative nausea and vomiting)    Uterine fibroid    with heavy menses and anemia (resolved by ablation)    Past Surgical History:  Procedure Laterality Date   ABDOMINAL HYSTERECTOMY     CARDIAC CATHETERIZATION  05/2005   mild CAD   DILATION AND CURETTAGE OF UTERUS  12/08   hysteroscopy and endometrial ablation   ETHMOIDECTOMY Bilateral 06/18/2019   Procedure: ETHMOIDECTOMY;  Surgeon: Leta Baptist, MD;  Location: [place];  Service: ENT;  Laterality: Bilateral;   FRONTAL SINUS EXPLORATION Bilateral 06/18/2019   Procedure: FRONTAL SINUS EXPLORATION;  Surgeon: Leta Baptist, MD;  Location: [place];  Service: ENT;  Laterality: Bilateral;   LAPAROSCOPIC SUPRACERVICAL HYSTERECTOMY  12/09/2011   Procedure: LAPAROSCOPIC SUPRACERVICAL HYSTERECTOMY;  Surgeon: Osborne Oman, MD;  Location: [facility];  Service: Gynecology;  Laterality: N/A;   MAXILLARY ANTROSTOMY Bilateral 06/18/2019   Procedure: MAXILLARY ANTROSTOMY;  Surgeon: Leta Baptist, MD;  Location: [place];  Service: ENT;  Laterality: Bilateral;   NASAL SINUS SURGERY  11/07   SALPINGOOPHORECTOMY  12/09/2011   Procedure: SALPINGO OOPHERECTOMY;  Surgeon: Osborne Oman, MD;  Location: [facility];  Service: Gynecology;  Laterality: Bilateral;   SINUS ENDO WITH FUSION Bilateral 06/18/2019   Procedure: SINUS ENDO WITH FUSION;  Surgeon:  Leta Baptist, MD;  Location: [place];  Service: ENT;  Laterality: Bilateral;   SPHENOIDECTOMY Bilateral 06/18/2019   Procedure: SPHENOIDECTOMY WITH TISSUE REMOVAL;  Surgeon: Leta Baptist, MD;  Location: [place];  Service: ENT;  Laterality: Bilateral;   SUPRACERVICAL ABDOMINAL HYSTERECTOMY  12/09/2011   Procedure: HYSTERECTOMY SUPRACERVICAL ABDOMINAL;  Surgeon: Osborne Oman, MD;  Location: [facility];  Service: Gynecology;  Laterality: N/A;   TUBAL LIGATION     TURBINATE REDUCTION Bilateral 06/18/2019   Procedure: TURBINATE REDUCTION;  Surgeon: Leta Baptist, MD;  Location: [place];  Service: ENT;  Laterality: Bilateral;    Allergies  Allergen Reactions   Codeine Nausea And Vomiting    Tolerates with food    Social History   Tobacco Use   Smoking status: Former    Packs/day: 0.25    Years: 24.00    Total pack years: 6.00    Types: Cigarettes    Quit date: 07/01/2001    Years since quitting: 20.6   Smokeless tobacco: Never  Substance Use Topics   Alcohol use: No    Alcohol/week: 0.0 standard drinks of alcohol    Family History  Problem Relation Age of Onset   Hypertension Mother    Cancer Mother 71       uterine   Hypertension Father    Heart disease Other        MI   Diabetes Other    Stroke Other    Heart disease Brother        age 87 and 25  Cancer Other        lung   Breast cancer Neg Hx    Prior to Admission medications   Medication Sig Start Date End Date Taking? Authorizing Provider  acetaminophen (TYLENOL) 500 MG tablet Take 1,000 mg by mouth every 6 (six) hours as needed for moderate pain.   Yes [provider]  fluticasone (FLONASE) 50 MCG/ACT nasal spray USE 2 SPRAY INTO EACH NOSTRIL DAILY 02/15/22  Yes Jerrol Banana., MD  metoprolol tartrate (LOPRESSOR) 25 MG tablet TAKE 1 TABLET BY MOUTH TWICE A DAY 11/30/21  Yes Jerrol Banana., MD  simvastatin (ZOCOR) 20 MG tablet Take 1 tablet (20 mg total) by  mouth daily. 09/25/21  Yes Jerrol Banana., MD  amLODipine (NORVASC) 5 MG tablet TAKE 1 TABLET BY MOUTH EVERY DAY Patient not taking: Reported on 02/08/2022 05/05/19   Jerrol Banana., MD  azelastine (ASTELIN) 0.1 % nasal spray 2 sprays in each nostril daily until symptoms resolve Patient not taking: Reported on 02/08/2022 08/27/18   Maury Dus, NP  cetirizine (ZYRTEC) 10 MG tablet Take 1 tablet (10 mg total) by mouth daily. Patient not taking: Reported on 02/08/2022 08/15/20   Jerrol Banana., MD  fexofenadine (ALLEGRA) 180 MG tablet Take 1 tablet (180 mg total) by mouth daily. Patient not taking: Reported on 05/17/2020 08/26/19   Maury Dus, NP  linaclotide [facility]) 145 MCG CAPS capsule Take 1 capsule (145 mcg total) by mouth daily before breakfast. Patient not taking: Reported on 02/08/2022 09/02/19   Jerrol Banana., MD     Review of Systems  Positive ROS: As above  All other systems have been reviewed and were otherwise negative with the exception of those mentioned in the HPI and as above.  Objective: Vital signs in last 24 hours: Temp:  [97.6 F (36.4 C)] 97.6 F (36.4 C) (08/24 0838) Pulse Rate:  [61] 61 (08/24 0838) Resp:  [20] 20 (08/24 0838) BP: (185)/(86) 185/86 (08/24 0838) SpO2:  [96 %] 96 % (08/24 0838) Weight:  [88.9 kg] 88.9 kg (08/24 0838) Estimated body mass index is 30.7 kg/m as calculated from the following:   Height as of this encounter: '5\' 7"'$  (1.702 m).   Weight as of this encounter: 88.9 kg.   General Appearance: Alert Head: Normocephalic, without obvious abnormality, atraumatic Eyes: PERRL, conjunctiva/corneas clear, EOM's intact,    Ears: Normal  Throat: Normal  Neck: Supple, Back: unremarkable Lungs: Clear to auscultation bilaterally, respirations unlabored Heart: Regular rate and rhythm, no murmur, rub or gallop Abdomen: Soft, non-tender Extremities: Extremities normal, atraumatic, no cyanosis or edema Skin:  unremarkable  NEUROLOGIC:   Mental status: alert and oriented,Motor Exam - grossly normal Sensory Exam - grossly normal Reflexes:  Coordination - grossly normal Gait - grossly normal Balance - grossly normal Cranial Nerves: I: smell Not tested  II: visual acuity  OS: Normal  OD: Normal   II: visual fields Full to confrontation  II: pupils Equal, round, reactive to light  III,VII: ptosis None  III,IV,VI: extraocular muscles  Full ROM  V: mastication Normal  V: facial light touch sensation  Normal  V,VII: corneal reflex  Present  VII: facial muscle function - upper  Normal  VII: facial muscle function - lower Normal  VIII: hearing Not tested  IX: soft palate elevation  Normal  IX,X: gag reflex Present  XI: trapezius strength  5/5  XI: sternocleidomastoid strength 5/5  XI: neck flexion strength  5/5  XII: tongue strength  Normal    Data Review Lab Results  Component Value Date   WBC 7.6 02/12/2022   HGB 14.3 02/12/2022   HCT 44.6 02/12/2022   MCV 89.6 02/12/2022   PLT 261 02/12/2022   Lab Results  Component Value Date   NA 142 02/12/2022   K 3.6 02/12/2022   CL 108 02/12/2022   CO2 27 02/12/2022   BUN 14 02/12/2022   CREATININE 0.73 02/12/2022   GLUCOSE 98 02/12/2022   No results found for: "INR", "PROTIME"  Assessment/Plan: Lumbar spinal stenosis, lumbar facet arthropathy, lumbar spinal stenosis, neurogenic claudication, lumbago, lumbar radiculopathy: I have discussed the situation with the patient.  I reviewed her imaging studies with her and pointed out the abnormalities.  We have discussed the various treatment options including surgery.  I have described the surgical treatment option of an L4-5 decompression, instrumentation and fusion.  I have shown her surgical models.  I have given her a surgical pamphlet.  We have discussed the risk, benefits, alternatives, expected postop course, and likelihood of achieving our goals with surgery.  I have answered all her  questions.  She has decided proceed with surgery.   Ophelia Charter 02/21/2022 9:19 AM

## 2022-02-21 NOTE — Anesthesia Postprocedure Evaluation (Signed)
Anesthesia Post Note  Patient: Barbara Kaiser  Procedure(s) Performed: LUMBAR FOUR-FIVE POSTERIOR LUMBAR INTERBODY FUSION (Spine Lumbar)     Patient location during evaluation: Short Stay Anesthesia Type: General Level of consciousness: awake Pain management: pain level controlled Vital Signs Assessment: post-procedure vital signs reviewed and stable Respiratory status: spontaneous breathing Cardiovascular status: stable Postop Assessment: no apparent nausea or vomiting Anesthetic complications: no   No notable events documented.  Last Vitals:  Vitals:   02/21/22 1330 02/21/22 1345  BP: (!) 142/72 134/67  Pulse: 61 64  Resp: 13 16  Temp:    SpO2: 93% 96%    Last Pain:  Vitals:   02/21/22 1345  TempSrc:   PainSc: Asleep                 Naria Abbey

## 2022-02-21 NOTE — Anesthesia Procedure Notes (Signed)
Procedure Name: Intubation Date/Time: 02/21/2022 10:01 AM  Performed by: Claris Che, CRNAPre-anesthesia Checklist: Patient identified, Emergency Drugs available, Suction available, Patient being monitored and Timeout performed Patient Re-evaluated:Patient Re-evaluated prior to induction Oxygen Delivery Method: Circle system utilized Preoxygenation: Pre-oxygenation with 100% oxygen Induction Type: IV induction and Cricoid Pressure applied Ventilation: Mask ventilation without difficulty Laryngoscope Size: Mac and 4 Grade View: Grade I Tube type: Oral Number of attempts: 1 Airway Equipment and Method: Stylet Placement Confirmation: ETT inserted through vocal cords under direct vision, positive ETCO2 and breath sounds checked- equal and bilateral Secured at: 23 cm Tube secured with: Tape Dental Injury: Teeth and Oropharynx as per pre-operative assessment

## 2022-02-21 NOTE — Op Note (Signed)
Brief history: The patient is a 66 year old black female who is complained of back and right greater left leg pain consistent with neurogenic location.  She has failed medical management.  She was worked up with lumbar x-rays and lumbar MRI which demonstrated an L4-5 spondylolisthesis and spinal stenosis.  I discussed the various treatment options with her.  She has decided proceed with surgery.  Preoperative diagnosis: L4-5 spondylolisthesis, degenerative disc disease, spinal stenosis compressing both the L4 and the L5 nerve roots; lumbago; lumbar radiculopathy; neurogenic claudication  Postoperative diagnosis: The same  Procedure: Bilateral L4-5 laminotomy/foraminotomies/medial facetectomy to decompress the bilateral L4 and L5 nerve roots(the work required to do this was in addition to the work required to do the posterior lumbar interbody fusion because of the patient's spinal stenosis, facet arthropathy. Etc. requiring a wide decompression of the nerve roots.);  Right L4-5 transforaminal lumbar interbody fusion with local morselized autograft bone and Zimmer DBM; insertion of interbody prosthesis at L4-5 (globus peek expandable interbody prosthesis); posterior nonsegmental instrumentation from L4 to L5 with globus titanium pedicle screws and rods; posterior lateral arthrodesis at L4-5 with local morselized autograft bone and Zimmer DBM.  Surgeon: Dr. Earle Gell  Asst.: Arnetha Massy, NP  Anesthesia: Gen. endotracheal  Estimated blood loss: 200 cc  Drains: None  Complications: None  Description of procedure: The patient was brought to the operating room by the anesthesia team. General endotracheal anesthesia was induced. The patient was turned to the prone position on the Wilson frame. The patient's lumbosacral region was then prepared with Betadine scrub and Betadine solution. Sterile drapes were applied.  I then injected the area to be incised with Marcaine with epinephrine solution. I  then used the scalpel to make a linear midline incision over the L4-5 interspace. I then used electrocautery to perform a bilateral subperiosteal dissection exposing the spinous process and lamina of L4-5. We then obtained intraoperative radiograph to confirm our location. We then inserted the Verstrac retractor to provide exposure.  I began the decompression by using the high speed drill to perform laminotomies at L4-5 bilaterally. We then used the Kerrison punches to widen the laminotomy and removed the ligamentum flavum at L4-5 bilaterally. We used the Kerrison punches to remove the medial facets at L4-5 bilaterally, we removed the right L4-5 facet. We performed wide foraminotomies about the bilateral L4-5 nerve roots completing the decompression.  We now turned our attention to the posterior lumbar interbody fusion. I used a scalpel to incise the intervertebral disc at L4-5 bilaterally. I then performed a partial intervertebral discectomy at L1-5 bilaterally using the pituitary forceps. We prepared the vertebral endplates at J1-9 bilaterally for the fusion by removing the soft tissues with the curettes. We then used the trial spacers to pick the appropriate sized interbody prosthesis. We prefilled his prosthesis with a combination of local morselized autograft bone that we obtained during the decompression as well as Zimmer DBM. We inserted the prefilled prosthesis into the interspace at L4-5 from the right, we then turned and expanded the prosthesis. There was a good snug fit of the prosthesis in the interspace. We then filled and the remainder of the intervertebral disc space with local morselized autograft bone and Zimmer DBM. This completed the posterior lumbar interbody arthrodesis.  During the decompression and insertion of the prosthesis the assistant protected the thecal sac and nerve roots with the D'Errico retractor.  We now turned attention to the instrumentation. Under fluoroscopic guidance we  cannulated the bilateral L4 and L5 pedicles  with the bone probe. We then removed the bone probe. We then tapped the pedicle with a 6.5 millimeter tap. We then removed the tap. We probed inside the tapped pedicle with a ball probe to rule out cortical breaches. We then inserted a 7.5 x 50 millimeter pedicle screw into the L4 and L5 pedicles bilaterally under fluoroscopic guidance. We then palpated along the medial aspect of the pedicles to rule out cortical breaches. There were none. The nerve roots were not injured. We then connected the unilateral pedicle screws with a lordotic rod. We compressed the construct and secured the rod in place with the caps. We then tightened the caps appropriately. This completed the instrumentation from L4-5 bilaterally.  We now turned our attention to the posterior lateral arthrodesis at L4-5. We used the high-speed drill to decorticate the remainder of the facets, pars, transverse process at L4-5. We then applied a combination of local morselized autograft bone and Zimmer DBM over these decorticated posterior lateral structures. This completed the posterior lateral arthrodesis.  We then obtained hemostasis using bipolar electrocautery. We irrigated the wound out with bacitracin solution. We inspected the thecal sac and nerve roots and noted they were well decompressed. We then removed the retractor.  We injected Exparel . We reapproximated patient's thoracolumbar fascia with interrupted #1 Vicryl suture. We reapproximated patient's subcutaneous tissue with interrupted 2-0 Vicryl suture. The reapproximated patient's skin with Steri-Strips and benzoin. The wound was then coated with bacitracin ointment. A sterile dressing was applied. The drapes were removed. The patient was subsequently returned to the supine position where they were extubated by the anesthesia team. He was then transported to the post anesthesia care unit in stable condition. All sponge instrument and needle  counts were reportedly correct at the end of this case.

## 2022-02-21 NOTE — Anesthesia Preprocedure Evaluation (Signed)
Anesthesia Evaluation  Patient identified by MRN, date of birth, ID band  Reviewed: Allergy & Precautions, NPO status , Patient's Chart, lab work & pertinent test results  History of Anesthesia Complications (+) PONV and history of anesthetic complications  Airway Mallampati: II  TM Distance: >3 FB     Dental   Pulmonary former smoker,    breath sounds clear to auscultation       Cardiovascular hypertension, + CAD   Rhythm:Regular Rate:Normal     Neuro/Psych    GI/Hepatic negative GI ROS, Neg liver ROS,   Endo/Other  negative endocrine ROS  Renal/GU negative Renal ROS     Musculoskeletal   Abdominal   Peds  Hematology   Anesthesia Other Findings   Reproductive/Obstetrics                             Anesthesia Physical Anesthesia Plan  ASA: 3  Anesthesia Plan: General   Post-op Pain Management:    Induction:   PONV Risk Score and Plan: 4 or greater and Ondansetron and Dexamethasone  Airway Management Planned: Oral ETT  Additional Equipment:   Intra-op Plan:   Post-operative Plan: Possible Post-op intubation/ventilation  Informed Consent: I have reviewed the patients History and Physical, chart, labs and discussed the procedure including the risks, benefits and alternatives for the proposed anesthesia with the patient or authorized representative who has indicated his/her understanding and acceptance.       Plan Discussed with: CRNA and Anesthesiologist  Anesthesia Plan Comments:         Anesthesia Quick Evaluation

## 2022-02-21 NOTE — Transfer of Care (Signed)
Immediate Anesthesia Transfer of Care Note  Patient: Barbara Kaiser  Procedure(s) Performed: LUMBAR FOUR-FIVE POSTERIOR LUMBAR INTERBODY FUSION (Spine Lumbar)  Patient Location: PACU  Anesthesia Type:General  Level of Consciousness: drowsy, patient cooperative and responds to stimulation  Airway & Oxygen Therapy: Patient Spontanous Breathing and Patient connected to nasal cannula oxygen  Post-op Assessment: Report given to RN, Post -op Vital signs reviewed and stable and Patient moving all extremities X 4  Post vital signs: Reviewed and stable  Last Vitals:  Vitals Value Taken Time  BP 132/60 02/21/22 1300  Temp    Pulse 69 02/21/22 1303  Resp 15 02/21/22 1303  SpO2 97 % 02/21/22 1303  Vitals shown include unvalidated device data.  Last Pain:  Vitals:   02/21/22 0851  TempSrc:   PainSc: 3       Patients Stated Pain Goal: 2 (35/45/62 5638)  Complications: No notable events documented.

## 2022-02-22 DIAGNOSIS — I2510 Atherosclerotic heart disease of native coronary artery without angina pectoris: Secondary | ICD-10-CM | POA: Diagnosis not present

## 2022-02-22 DIAGNOSIS — Z87891 Personal history of nicotine dependence: Secondary | ICD-10-CM | POA: Diagnosis not present

## 2022-02-22 DIAGNOSIS — I10 Essential (primary) hypertension: Secondary | ICD-10-CM | POA: Diagnosis not present

## 2022-02-22 DIAGNOSIS — M5116 Intervertebral disc disorders with radiculopathy, lumbar region: Secondary | ICD-10-CM | POA: Diagnosis not present

## 2022-02-22 DIAGNOSIS — M4316 Spondylolisthesis, lumbar region: Secondary | ICD-10-CM | POA: Diagnosis not present

## 2022-02-22 DIAGNOSIS — M48062 Spinal stenosis, lumbar region with neurogenic claudication: Secondary | ICD-10-CM | POA: Diagnosis not present

## 2022-02-22 MED ORDER — OXYCODONE-ACETAMINOPHEN 5-325 MG PO TABS: 1.0000 | ORAL_TABLET | ORAL | 0 refills | Status: DC | PRN

## 2022-02-22 MED ORDER — DOCUSATE SODIUM 100 MG PO CAPS: 100.0000 mg | ORAL_CAPSULE | Freq: Two times a day (BID) | ORAL | 0 refills | Status: DC

## 2022-02-22 MED ORDER — OXYCODONE-ACETAMINOPHEN 5-325 MG PO TABS
1.0000 | ORAL_TABLET | ORAL | Status: DC | PRN
  Administered 2022-02-22: 2 via ORAL
  Filled 2022-02-22: qty 2

## 2022-02-22 MED ORDER — CYCLOBENZAPRINE HCL 10 MG PO TABS: 10.0000 mg | ORAL_TABLET | Freq: Three times a day (TID) | ORAL | 0 refills | Status: DC | PRN

## 2022-02-22 MED FILL — Heparin Sodium (Porcine) Inj 1000 Unit/ML: INTRAMUSCULAR | Qty: 30 | Status: AC

## 2022-02-22 MED FILL — Sodium Chloride IV Soln 0.9%: INTRAVENOUS | Qty: 1000 | Status: AC

## 2022-02-22 NOTE — Progress Notes (Signed)
Patient alert and oriented, voiding adequately, skin clean, dry and intact without evidence of skin break down, or symptoms of complications - no redness or edema noted, only slight tenderness at site.  Patient states pain is manageable at time of discharge. Patient has an appointment with MD in 2 weeks 

## 2022-02-22 NOTE — Evaluation (Signed)
Occupational Therapy Evaluation Patient Details Name: Barbara Kaiser MRN: [id number] DOB: [birth year] Today's Date: 02/22/2022   History of Present Illness Pt is a 66 y/o female who presents s/p L4-L5 PLIF on 02/21/2022. PMH significant for HTN, several nasal/sinus surgeries.   Clinical Impression   PTA, pt was living with her husband and was independent; daughter plans to stay at dc for increased support. Currently, pt performing at [place] I level for ADLs and functional mobility. Provided education and handout on back precautions, bed mobility, grooming, brace management, LB ADLs, toileting, and tub transfer; pt demonstrated understanding. Answered all pt questions. Recommend dc home once medically stable per physician. All acute OT needs met and will sign off. Thank you.      Recommendations for follow up therapy are one component of a multi-disciplinary discharge planning process, led by the attending physician.  Recommendations may be updated based on patient status, additional functional criteria and insurance authorization.   Follow Up Recommendations  No OT follow up    Assistance Recommended at Discharge PRN  Patient can return home with the following      Functional Status Assessment  Patient has had a recent decline in their functional status and demonstrates the ability to make significant improvements in function in a reasonable and predictable amount of time.  Equipment Recommendations  None recommended by OT    Recommendations for Other Services       Precautions / Restrictions Precautions Precautions: Back;Fall Precaution Booklet Issued: Yes (comment) Precaution Comments: Reviewed handout and pt was cued for precautions during functional mobility. Required Braces or Orthoses: Spinal Brace Spinal Brace: Lumbar corset;Applied in sitting position Restrictions Weight Bearing Restrictions: No      Mobility Bed Mobility Overal bed mobility: Needs  Assistance Bed Mobility: Rolling, Sidelying to Sit Rolling: Supervision Sidelying to sit: Supervision       General bed mobility comments: Supervision for safety during log roll    Transfers Overall transfer level: Needs assistance Equipment used: Rolling walker (2 wheels) Transfers: Sit to/from Stand Sit to Stand: Supervision           General transfer comment: Cues for hand placement      Balance Overall balance assessment: No apparent balance deficits (not formally assessed)                                         ADL either performed or assessed with clinical judgement   ADL Overall ADL's : Modified independent                                       General ADL Comments: Providing pt with education and handout on back precautions, bed mobility, grooming, toileting, tub transfer, and functional mobility.     Vision         Perception     Praxis      Pertinent Vitals/Pain Pain Assessment Pain Assessment: Faces Faces Pain Scale: Hurts little more Pain Location: Back Pain Descriptors / Indicators: Discomfort, Grimacing Pain Intervention(s): Monitored during session     Hand Dominance Right   Extremity/Trunk Assessment Upper Extremity Assessment Upper Extremity Assessment: Overall WFL for tasks assessed   Lower Extremity Assessment Lower Extremity Assessment: Defer to PT evaluation   Cervical / Trunk Assessment Cervical / Trunk Assessment: Back Surgery   Communication  Communication Communication: No difficulties   Cognition Arousal/Alertness: Awake/alert Behavior During Therapy: WFL for tasks assessed/performed Overall Cognitive Status: Within Functional Limits for tasks assessed                                       General Comments       Exercises     Shoulder Instructions      Home Living Family/patient expects to be discharged to:: Private residence Living Arrangements:  Non-relatives/Friends Available Help at Discharge: Family;Available 24 hours/day Type of Home: House Home Access: Stairs to enter [employer] of Steps: 3   Home Layout: One level     Bathroom Shower/Tub: Teacher, early years/pre: Standard     Home Equipment: None          Prior Functioning/Environment Prior Level of Function : Independent/Modified Independent                        OT Problem List: Decreased activity tolerance;Impaired balance (sitting and/or standing);Decreased knowledge of use of DME or AE;Decreased knowledge of precautions;Pain      OT Treatment/Interventions:      OT Goals(Current goals can be found in the care plan section) Acute Rehab OT Goals Patient Stated Goal: Go home OT Goal Formulation: All assessment and education complete, DC therapy  OT Frequency:      Co-evaluation              AM-PAC OT "6 Clicks" Daily Activity     Outcome Measure Help from another person eating meals?: None Help from another person taking care of personal grooming?: None Help from another person toileting, which includes using toliet, bedpan, or urinal?: None Help from another person bathing (including washing, rinsing, drying)?: None Help from another person to put on and taking off regular upper body clothing?: None Help from another person to put on and taking off regular lower body clothing?: None 6 Click Score: 24   End of Session Equipment Utilized During Treatment: Back brace Nurse Communication: Mobility status;Precautions  Activity Tolerance: Patient tolerated treatment well Patient left: in chair;with call bell/phone within reach  OT Visit Diagnosis: Muscle weakness (generalized) (M62.81);Pain                Time: 8841-6606 OT Time Calculation (min): 14 min Charges:  OT General Charges $OT Visit: 1 Visit OT Evaluation $OT Eval Low Complexity: 1 Low  Teancum Brule MSOT, OTR/L Acute Rehab Office:  [place] 02/22/2022, 9:31 AM

## 2022-02-22 NOTE — Evaluation (Signed)
Physical Therapy Evaluation   Patient Details Name: Barbara Kaiser MRN: [id number] DOB: 11/18/1955 Today's Date: 02/22/2022  History of Present Illness  Pt is a 66 y/o female who presents s/p L4-L5 PLIF on 02/21/2022. PMH significant for HTN, several nasal/sinus surgeries.   Clinical Impression  Pt admitted with above diagnosis. At the time of PT eval, pt was able to demonstrate transfers and ambulation with gross min guard assist to supervision for safety and no AD. Pt was educated on precautions, brace application/wearing schedule, appropriate activity progression, and car transfer. Pt currently with functional limitations due to the deficits listed below (see PT Problem List). Pt will benefit from skilled PT to increase their independence and safety with mobility to allow discharge to the venue listed below.         Recommendations for follow up therapy are one component of a multi-disciplinary discharge planning process, led by the attending physician.  Recommendations may be updated based on patient status, additional functional criteria and insurance authorization.  Follow Up Recommendations No PT follow up      Assistance Recommended at Discharge PRN  Patient can return home with the following  A little help with walking and/or transfers;A little help with bathing/dressing/bathroom;Assistance with cooking/housework;Assist for transportation;Help with stairs or ramp for entrance    Equipment Recommendations None recommended by PT  Recommendations for Other Services       Functional Status Assessment Patient has had a recent decline in their functional status and demonstrates the ability to make significant improvements in function in a reasonable and predictable amount of time.     Precautions / Restrictions Precautions Precautions: Back;Fall Precaution Booklet Issued: Yes (comment) Precaution Comments: Reviewed handout and pt was cued for precautions during functional  mobility. Required Braces or Orthoses: Spinal Brace Spinal Brace: Lumbar corset;Applied in sitting position Restrictions Weight Bearing Restrictions: No      Mobility  Bed Mobility               General bed mobility comments: Pt was received standing in the bathroom with OT    Transfers Overall transfer level: Needs assistance Equipment used: Rolling walker (2 wheels) Transfers: Sit to/from Stand Sit to Stand: Supervision           General transfer comment: VC's for improved posture and hand placement on seated surface for safety.    Ambulation/Gait Ambulation/Gait assistance: Min guard Gait Distance (Feet): 400 Feet Assistive device: None Gait Pattern/deviations: Step-through pattern, Decreased stride length, Trunk flexed, Narrow base of support Gait velocity: Decreased Gait velocity interpretation: <1.31 ft/sec, indicative of household ambulator   General Gait Details: VC's for improved posture and forward gaze. Pt moving slow and guarded but without overt LOB.  Stairs Stairs: Yes Stairs assistance: Min guard Stair Management: One rail Left, Step to pattern, Forwards Number of Stairs: 3 General stair comments: VC's for sequencing and general safety. Pt was able to manage without assistance but close guard provided for safety.  Wheelchair Mobility    Modified Rankin (Stroke Patients Only)       Balance Overall balance assessment: No apparent balance deficits (not formally assessed)                                           Pertinent Vitals/Pain Pain Assessment Pain Assessment: Faces Faces Pain Scale: Hurts little more Pain Location: Back Pain Descriptors / Indicators: Discomfort, Grimacing Pain  Intervention(s): Limited activity within patient's tolerance, Monitored during session, Repositioned    Home Living Family/patient expects to be discharged to:: Private residence Living Arrangements: Non-relatives/Friends Available  Help at Discharge: Family;Available 24 hours/day Type of Home: House Home Access: Stairs to enter   [employer] of Steps: 3   Home Layout: One level Home Equipment: None      Prior Function Prior Level of Function : Independent/Modified Independent                     Hand Dominance   Dominant Hand: Right    Extremity/Trunk Assessment   Upper Extremity Assessment Upper Extremity Assessment: Overall WFL for tasks assessed    Lower Extremity Assessment Lower Extremity Assessment: Generalized weakness;LLE deficits/detail LLE Deficits / Details: Mild; consistent with pre-op diagnosis. L>R    Cervical / Trunk Assessment Cervical / Trunk Assessment: Back Surgery  Communication   Communication: No difficulties  Cognition Arousal/Alertness: Awake/alert Behavior During Therapy: WFL for tasks assessed/performed Overall Cognitive Status: Within Functional Limits for tasks assessed                                          General Comments      Exercises     Assessment/Plan    PT Assessment Patient needs continued PT services  PT Problem List Decreased strength;Decreased activity tolerance;Decreased balance;Decreased mobility;Decreased knowledge of use of DME;Decreased safety awareness;Decreased knowledge of precautions;Pain       PT Treatment Interventions DME instruction;Gait training;Stair training;Therapeutic activities;Functional mobility training;Therapeutic exercise;Balance training;Patient/family education    PT Goals (Current goals can be found in the Care Plan section)  Acute Rehab PT Goals Patient Stated Goal: Home today, be able to walk around the grocery store to shop without having to stop due to pain. PT Goal Formulation: With patient Time For Goal Achievement: 03/01/22 Potential to Achieve Goals: Good    Frequency Min 5X/week     Co-evaluation               AM-PAC PT "6 Clicks" Mobility  Outcome Measure  Help needed turning from your back to your side while in a flat bed without using bedrails?: None Help needed moving from lying on your back to sitting on the side of a flat bed without using bedrails?: A Little Help needed moving to and from a bed to a chair (including a wheelchair)?: A Little Help needed standing up from a chair using your arms (e.g., wheelchair or bedside chair)?: A Little Help needed to walk in hospital room?: A Little Help needed climbing 3-5 steps with a railing? : A Little 6 Click Score: 19    End of Session Equipment Utilized During Treatment: Gait belt;Back brace Activity Tolerance: Patient tolerated treatment well Patient left: with call bell/phone within reach;Other (comment) (In bathroom to wash up at the sink) Nurse Communication: Mobility status PT Visit Diagnosis: Unsteadiness on feet (R26.81);Pain Pain - part of body:  (back)    Time: 7124-5809 PT Time Calculation (min) (ACUTE ONLY): 20 min   Charges:   PT Evaluation $PT Eval Low Complexity: [place], PT, DPT Acute Rehabilitation Services Secure Chat Preferred Office: [phone]   Thelma Comp 02/22/2022, 11:42 AM

## 2022-02-22 NOTE — Discharge Summary (Signed)
Physician Discharge Summary  Patient ID: Barbara Kaiser MRN: [id number] DOB/AGE: 1955-12-25 66 y.o.  Admit date: 02/21/2022 Discharge date: 02/22/2022  Admission Diagnoses: Lumbar spondylolisthesis, lumbar facet arthropathy lumbar spinal stenosis, lumbago, lumbar radiculopathy, neurogenic claudication  Discharge Diagnoses: The same Principal Problem:   Spondylolisthesis of lumbar region   Discharged Condition: good  Hospital Course: I performed an L4-5 decompression, instrumentation and fusion on the patient on 02/21/2022.  The surgery went well.  The patient's postoperative course was unremarkable.  On postoperative day #1 she felt much better and requested discharge home.  She was given written and verbal discharge instructions.  All her questions were answered.  Consults: PT, OT, care management Significant Diagnostic Studies: None Treatments: L4-5 decompression, instrumentation and fusion. Discharge Exam: Blood pressure (!) 118/56, pulse 64, temperature 98.6 F (37 C), temperature source Oral, resp. rate 20, height '5\' 7"'$  (1.702 m), weight 88.9 kg, last menstrual period 11/28/2011, SpO2 99 %. The patient is alert and pleasant.  She looks well.  Her strength is normal.  Disposition: Home  Discharge Instructions     Call MD for:  difficulty breathing, headache or visual disturbances   Complete by: As directed    Call MD for:  extreme fatigue   Complete by: As directed    Call MD for:  hives   Complete by: As directed    Call MD for:  persistant dizziness or light-headedness   Complete by: As directed    Call MD for:  persistant nausea and vomiting   Complete by: As directed    Call MD for:  redness, tenderness, or signs of infection (pain, swelling, redness, odor or green/yellow discharge around incision site)   Complete by: As directed    Call MD for:  severe uncontrolled pain   Complete by: As directed    Call MD for:  temperature >100.4   Complete by: As directed     Diet - low sodium heart healthy   Complete by: As directed    Discharge instructions   Complete by: As directed    Call [phone] for a followup appointment. Take a stool softener while you are using pain medications.   Driving Restrictions   Complete by: As directed    Do not drive for 2 weeks.   Increase activity slowly   Complete by: As directed    Lifting restrictions   Complete by: As directed    Do not lift more than 5 pounds. No excessive bending or twisting.   May shower / Bathe   Complete by: As directed    Remove the dressing for 3 days after surgery.  You may shower, but leave the incision alone.   Remove dressing in 48 hours   Complete by: As directed       Allergies as of 02/22/2022       Reactions   Codeine Nausea And Vomiting   Tolerates with food        Medication List     STOP taking these medications    acetaminophen 500 MG tablet Commonly known as: TYLENOL   azelastine 0.1 % nasal spray Commonly known as: ASTELIN   cetirizine 10 MG tablet Commonly known as: ZYRTEC   fexofenadine 180 MG tablet Commonly known as: ALLEGRA   fluticasone 50 MCG/ACT nasal spray Commonly known as: FLONASE       TAKE these medications    amLODipine 5 MG tablet Commonly known as: NORVASC TAKE 1 TABLET BY MOUTH EVERY DAY   cyclobenzaprine  10 MG tablet Commonly known as: FLEXERIL Take 1 tablet (10 mg total) by mouth 3 (three) times daily as needed for muscle spasms.   docusate sodium 100 MG capsule Commonly known as: COLACE Take 1 capsule (100 mg total) by mouth 2 (two) times daily.   linaclotide 145 MCG Caps capsule Commonly known as: Linzess Take 1 capsule (145 mcg total) by mouth daily before breakfast.   metoprolol tartrate 25 MG tablet Commonly known as: LOPRESSOR TAKE 1 TABLET BY MOUTH TWICE A DAY   oxyCODONE-acetaminophen 5-325 MG tablet Commonly known as: PERCOCET/ROXICET Take 1-2 tablets by mouth every 4 (four) hours as needed for  moderate pain.   simvastatin 20 MG tablet Commonly known as: ZOCOR Take 1 tablet (20 mg total) by mouth daily.         Signed: Ophelia Charter 02/22/2022, 6:33 AM

## 2022-03-15 ENCOUNTER — Other Ambulatory Visit: Payer: Self-pay | Admitting: Family Medicine

## 2022-03-15 DIAGNOSIS — M48062 Spinal stenosis, lumbar region with neurogenic claudication: Secondary | ICD-10-CM | POA: Diagnosis not present

## 2022-06-13 ENCOUNTER — Other Ambulatory Visit: Payer: Self-pay | Admitting: Family Medicine

## 2022-06-13 DIAGNOSIS — I10 Essential (primary) hypertension: Secondary | ICD-10-CM

## 2022-06-13 MED ORDER — METOPROLOL TARTRATE 25 MG PO TABS: 25.0000 mg | ORAL_TABLET | Freq: Two times a day (BID) | ORAL | 0 refills | Status: DC

## 2022-06-13 MED ORDER — AMLODIPINE BESYLATE 5 MG PO TABS: 5.0000 mg | ORAL_TABLET | Freq: Every day | ORAL | 0 refills | Status: DC

## 2022-06-13 NOTE — Telephone Encounter (Signed)
Medication Refill - Medication: metoprolol tartrate (LOPRESSOR) 25 MG tablet   amLODipine (NORVASC) 5 MG tablet  (FYI: pt states Dr Rosanna Randy gave her this Rx in case her bp goes up and she cannot get it down. She likes to keep this on hand.) 90 day Has the patient contacted their pharmacy? Yes.   They told pt to call her dr.  Abbott Pao was a Rosanna Randy pt.   Preferred Pharmacy (with phone number or street name): [id number] [place] patient been seen for an appointment in the last year OR does the patient have an upcoming appointment? Yes.    Agent: Please be advised that RX refills may take up to 3 business days. We ask that you follow-up with your pharmacy.

## 2022-06-13 NOTE — Telephone Encounter (Signed)
Requested medication (s) are due for refill today: expired medication  Requested medication (s) are on the active medication list: yes   Last refill:  norvasc- 05/05/2019 #90 1 refills, lopressor- 03/15/22 #180 0 refills  Future visit scheduled: yes in 2 months with Dr. Quentin Cornwall  Notes to clinic:  expired medication- norvasc. No refills remain on lopressor. Do you want to order Rx norvasc and refill Lopressor?     Requested Prescriptions  Pending Prescriptions Disp Refills   amLODipine (NORVASC) 5 MG tablet 90 tablet 1    Sig: Take 1 tablet (5 mg total) by mouth daily.     Cardiovascular: Calcium Channel Blockers 2 Failed - 06/13/2022  9:25 AM      Failed - Valid encounter within last 6 months    Recent Outpatient Visits           10 months ago Annual physical exam   [facility] Jerrol Banana., MD   1 year ago Strain of left pectoralis muscle, initial encounter   [place], Vickki Muff, PA-C   1 year ago Annual physical exam   [facility] Jerrol Banana., MD   2 years ago Pain in left lower leg   [facility] [place], Kelby Aline, FNP   2 years ago Constipation, unspecified constipation type   [facility] Jerrol Banana., MD       Future Appointments             In 2 months Simmons-Robinson, Riki Sheer, MD [facility] [place] BP in normal range    BP Readings from Last 1 Encounters:  02/22/22 114/60         Passed - Last Heart Rate in normal range    Pulse Readings from Last 1 Encounters:  02/22/22 (!) 57          metoprolol tartrate (LOPRESSOR) 25 MG tablet 180 tablet 0    Sig: Take 1 tablet (25 mg total) by mouth 2 (two) times daily.     Cardiovascular:  Beta Blockers Failed - 06/13/2022  9:25 AM      Failed - Valid encounter within last 6 months    Recent Outpatient Visits           10 months ago Annual physical  exam   [facility] Jerrol Banana., MD   1 year ago Strain of left pectoralis muscle, initial encounter   [place], Vickki Muff, [place]   1 year ago Annual physical exam   [facility] Jerrol Banana., MD   2 years ago Pain in left lower leg   [facility], Kelby Aline, FNP   2 years ago Constipation, unspecified constipation type   [facility] Jerrol Banana., MD       Future Appointments             In 2 months Simmons-Robinson, Riki Sheer, MD [facility] [place] BP in normal range    BP Readings from Last 1 Encounters:  02/22/22 114/60         Passed - Last Heart Rate in normal range    Pulse Readings from Last 1 Encounters:  02/22/22 (!) 57

## 2022-07-10 ENCOUNTER — Encounter: Payer: Self-pay | Admitting: Adult Health

## 2022-07-10 ENCOUNTER — Ambulatory Visit (INDEPENDENT_AMBULATORY_CARE_PROVIDER_SITE_OTHER): Payer: Self-pay | Admitting: Adult Health

## 2022-07-10 VITALS — BP 130/80 | HR 73 | Temp 97.5°F | Wt 206.4 lb

## 2022-07-10 DIAGNOSIS — J3489 Other specified disorders of nose and nasal sinuses: Secondary | ICD-10-CM

## 2022-07-10 MED ORDER — FLUTICASONE PROPIONATE 50 MCG/ACT NA SUSP: 2.0000 | Freq: Every day | NASAL | 6 refills | Status: AC

## 2022-07-10 NOTE — Progress Notes (Signed)
[facility] [address], [place] [address]   Office Visit Note  Patient Name: Barbara Kaiser Date of Birth [id number]  Medical Record number [id number]  Date of Service: 07/10/2022  Chief Complaint  Patient presents with   Tinnitus    Started about 1 month ago. Right ear ringing. Sinus ?     HPI Pt is here for a sick visit.  She reports for about a month she has been having intermittent ringing in her ears. She has had some nasal congestion as well, but that is minimal at this time.  She describes some head pressure as well.  Denies fever, chills or cough.  She has taken Nyquil with some relief.    Current Medication:  Outpatient Encounter Medications as of 07/10/2022  Medication Sig   amLODipine (NORVASC) 5 MG tablet Take 1 tablet (5 mg total) by mouth daily.   cyclobenzaprine (FLEXERIL) 10 MG tablet Take 1 tablet (10 mg total) by mouth 3 (three) times daily as needed for muscle spasms.   docusate sodium (COLACE) 100 MG capsule Take 1 capsule (100 mg total) by mouth 2 (two) times daily.   fluticasone (FLONASE) 50 MCG/ACT nasal spray Place 2 sprays into both nostrils daily.   metoprolol tartrate (LOPRESSOR) 25 MG tablet Take 1 tablet (25 mg total) by mouth 2 (two) times daily.   simvastatin (ZOCOR) 20 MG tablet Take 1 tablet (20 mg total) by mouth daily.   linaclotide (LINZESS) 145 MCG CAPS capsule Take 1 capsule (145 mcg total) by mouth daily before breakfast. (Patient not taking: Reported on 02/08/2022)   oxyCODONE-acetaminophen (PERCOCET/ROXICET) 5-325 MG tablet Take 1-2 tablets by mouth every 4 (four) hours as needed for moderate pain. (Patient not taking: Reported on 07/10/2022)   No facility-administered encounter medications on file as of 07/10/2022.      Medical History: Past Medical History:  Diagnosis Date   Allergic rhinitis    Anemia, iron deficiency    Colon polyps 5/88   Complication of anesthesia    hard to wake   Hypertension    PONV  (postoperative nausea and vomiting)    Uterine fibroid    with heavy menses and anemia (resolved by ablation)     Vital Signs: BP 130/80 (BP Location: Left Arm, Patient Position: Sitting, Cuff Size: Normal)   Pulse 73   Temp (!) 97.5 F (36.4 C) (Tympanic)   Wt 206 lb 6.4 oz (93.6 kg)   LMP 11/28/2011   SpO2 98%   BMI 32.33 kg/m    Review of Systems  Constitutional:  Negative for chills, fatigue and fever.  HENT:  Positive for congestion, ear pain and sinus pressure.   Eyes:  Negative for pain and itching.  Respiratory:  Negative for cough.   Cardiovascular:  Negative for chest pain.  Gastrointestinal:  Negative for diarrhea and nausea.    Physical Exam Vitals and nursing note reviewed.  Constitutional:      Appearance: Normal appearance.  HENT:     Head: Normocephalic.     Right Ear: Tympanic membrane and ear canal normal.     Left Ear: Tympanic membrane and ear canal normal.     Nose:     Right Turbinates: Pale.     Left Turbinates: Pale.     Right Sinus: Frontal sinus tenderness present. No maxillary sinus tenderness.     Left Sinus: Frontal sinus tenderness present. No maxillary sinus tenderness.     Mouth/Throat:     Mouth: Mucous  membranes are moist.  Eyes:     Pupils: Pupils are equal, round, and reactive to light.  Neurological:     Mental Status: She is alert.    Assessment/Plan: 1. Sinus pressure Use Flonase as discussed.  2 sprays each nostril, twice daily.  May tak - fluticasone (FLONASE) 50 MCG/ACT nasal spray; Place 2 sprays into both nostrils daily.  Dispense: 16 g; Refill: 6        General Counseling: Barbara Kaiser verbalizes understanding of the findings of todays visit and agrees with plan of treatment. I have discussed any further diagnostic evaluation that may be needed or ordered today. We also reviewed her medications today. she has been encouraged to call the office with any questions or concerns that should arise related to todays  visit.   No orders of the defined types were placed in this encounter.   Meds ordered this encounter  Medications   fluticasone (FLONASE) 50 MCG/ACT nasal spray    Sig: Place 2 sprays into both nostrils daily.    Dispense:  16 g    Refill:  6    Time spent:15 Minutes    Kendell Bane AGNP-C Nurse Practitioner

## 2022-08-15 NOTE — Progress Notes (Deleted)
Complete physical exam   Patient: Barbara Kaiser   DOB: [birth year]   67 y.o. Female  MRN: [id number] Visit Date: 08/19/2022  Today's healthcare provider: Eulis Foster, MD   No chief complaint on file.  Subjective    Barbara Kaiser is a 67 y.o. female who presents today for a complete physical exam.  She reports consuming a {diet types:17450} diet. {Exercise:19826} She generally feels {well/fairly well/poorly:18703}. She reports sleeping {well/fairly well/poorly:18703}. She {does/does not:200015} have additional problems to discuss today.  HPI  ***  Past Medical History:  Diagnosis Date   Allergic rhinitis    Anemia, iron deficiency    Colon polyps 99991111   Complication of anesthesia    hard to wake   Hypertension    PONV (postoperative nausea and vomiting)    Uterine fibroid    with heavy menses and anemia (resolved by ablation)   Past Surgical History:  Procedure Laterality Date   ABDOMINAL HYSTERECTOMY     CARDIAC CATHETERIZATION  05/2005   mild CAD   DILATION AND CURETTAGE OF UTERUS  12/08   hysteroscopy and endometrial ablation   ETHMOIDECTOMY Bilateral 06/18/2019   Procedure: ETHMOIDECTOMY;  Surgeon: Leta Baptist, MD;  Location: [facility];  Service: ENT;  Laterality: Bilateral;   FRONTAL SINUS EXPLORATION Bilateral 06/18/2019   Procedure: FRONTAL SINUS EXPLORATION;  Surgeon: Leta Baptist, MD;  Location: [facility];  Service: ENT;  Laterality: Bilateral;   LAPAROSCOPIC SUPRACERVICAL HYSTERECTOMY  12/09/2011   Procedure: LAPAROSCOPIC SUPRACERVICAL HYSTERECTOMY;  Surgeon: Osborne Oman, MD;  Location: [facility];  Service: Gynecology;  Laterality: N/A;   MAXILLARY ANTROSTOMY Bilateral 06/18/2019   Procedure: MAXILLARY ANTROSTOMY;  Surgeon: Leta Baptist, MD;  Location: [facility];  Service: ENT;  Laterality: Bilateral;   NASAL SINUS SURGERY  11/07   SALPINGOOPHORECTOMY  12/09/2011   Procedure: SALPINGO OOPHERECTOMY;   Surgeon: Osborne Oman, MD;  Location: [facility];  Service: Gynecology;  Laterality: Bilateral;   SINUS ENDO WITH FUSION Bilateral 06/18/2019   Procedure: SINUS ENDO WITH FUSION;  Surgeon: Leta Baptist, MD;  Location: [facility];  Service: ENT;  Laterality: Bilateral;   SPHENOIDECTOMY Bilateral 06/18/2019   Procedure: SPHENOIDECTOMY WITH TISSUE REMOVAL;  Surgeon: Leta Baptist, MD;  Location: [facility];  Service: ENT;  Laterality: Bilateral;   SUPRACERVICAL ABDOMINAL HYSTERECTOMY  12/09/2011   Procedure: HYSTERECTOMY SUPRACERVICAL ABDOMINAL;  Surgeon: Osborne Oman, MD;  Location: [facility];  Service: Gynecology;  Laterality: N/A;   TUBAL LIGATION     TURBINATE REDUCTION Bilateral 06/18/2019   Procedure: TURBINATE REDUCTION;  Surgeon: Leta Baptist, MD;  Location: [facility];  Service: ENT;  Laterality: Bilateral;   Social History   Socioeconomic History   Marital status: Single    Spouse name: Not on file   Number of children: 2   Years of education: Not on file   Highest education level: Not on file  Occupational History    Employer: [employer]  Tobacco Use   Smoking status: Former    Packs/day: 0.25    Years: 24.00    Total pack years: 6.00    Types: Cigarettes    Quit date: 07/01/2001    Years since quitting: 21.1   Smokeless tobacco: Never  Vaping Use   Vaping Use: Never used  Substance and Sexual Activity   Alcohol use: No    Alcohol/week: 0.0 standard drinks of alcohol   Drug use: No   Sexual  activity: Not Currently    Partners: Male    Birth control/protection: Surgical    Comment: tubalization.  Other Topics Concern   Not on file  Social History Narrative   Treadmill for exercise   Social Determinants of Health   Financial Resource Strain: Not on file  Food Insecurity: Not on file  Transportation Needs: Not on file  Physical Activity: Not on file  Stress: Not on file  Social Connections: Not on file  Intimate Partner  Violence: Not on file   Family Status  Relation Name Status   Mother  Alive   Father  Deceased       died of cirrhosis   Other Grandmother Alive   Brother  Alive   Other Grandfather Alive   Neg Hx  (Not Specified)   Family History  Problem Relation Age of Onset   Hypertension Mother    Cancer Mother 66       uterine   Hypertension Father    Heart disease Other        MI   Diabetes Other    Stroke Other    Heart disease Brother        age 11 and 51   Cancer Other        lung   Breast cancer Neg Hx    No Known Allergies  Patient Care Team: Eulas Post, MD as PCP - General (Family Medicine)   Medications: Outpatient Medications Prior to Visit  Medication Sig   amLODipine (NORVASC) 5 MG tablet Take 1 tablet (5 mg total) by mouth daily.   cyclobenzaprine (FLEXERIL) 10 MG tablet Take 1 tablet (10 mg total) by mouth 3 (three) times daily as needed for muscle spasms.   docusate sodium (COLACE) 100 MG capsule Take 1 capsule (100 mg total) by mouth 2 (two) times daily.   fluticasone (FLONASE) 50 MCG/ACT nasal spray Place 2 sprays into both nostrils daily.   linaclotide (LINZESS) 145 MCG CAPS capsule Take 1 capsule (145 mcg total) by mouth daily before breakfast. (Patient not taking: Reported on 02/08/2022)   metoprolol tartrate (LOPRESSOR) 25 MG tablet Take 1 tablet (25 mg total) by mouth 2 (two) times daily.   oxyCODONE-acetaminophen (PERCOCET/ROXICET) 5-325 MG tablet Take 1-2 tablets by mouth every 4 (four) hours as needed for moderate pain. (Patient not taking: Reported on 07/10/2022)   simvastatin (ZOCOR) 20 MG tablet Take 1 tablet (20 mg total) by mouth daily.   No facility-administered medications prior to visit.    Review of Systems  {Labs  Heme  Chem  Endocrine  Serology  Results Review (optional):23779}  Objective    LMP 11/28/2011  {Show previous vital signs (optional):23777}   Physical Exam  ***  Last depression screening scores    08/16/2021     2:10 PM 08/15/2020    2:26 PM 08/05/2019    9:56 AM  PHQ 2/9 Scores  PHQ - 2 Score 0 0 0  PHQ- 9 Score 0 0 1   Last fall risk screening    08/16/2021    2:10 PM  [place] in the past year? 0  Number falls in past yr: 0  Injury with Fall? 0  Risk for fall due to : No Fall Risks  Follow up Falls evaluation completed   Last Audit-C alcohol use screening    08/16/2021    2:10 PM  Alcohol Use Disorder Test (AUDIT)  1. How often do you have a drink containing alcohol?  0  2. How many drinks containing alcohol do you have on a typical day when you are drinking? 0  3. How often do you have six or more drinks on one occasion? 0  AUDIT-C Score 0   A score of 3 or more in women, and 4 or more in men indicates increased risk for alcohol abuse, EXCEPT if all of the points are from question 1   No results found for any visits on 08/19/22.  Assessment & Plan    Routine Health Maintenance and Physical Exam  Exercise Activities and Dietary recommendations  Goals   None     Immunization History  Administered Date(s) Administered   Influenza,inj,Quad PF,6+ Mos 04/20/2019, 03/28/2021   Influenza-Unspecified 03/31/2013, 03/31/2014, 03/22/2020   PFIZER(Purple Top)SARS-COV-2 Vaccination 09/05/2019, 09/28/2019   Td 03/21/2006   Tdap 08/05/2019    Health Maintenance  Topic Date Due   Zoster Vaccines- Shingrix (1 of 2) Never done   COLONOSCOPY (Pts 45-70yr Insurance coverage will need to be confirmed)  01/29/2019   INFLUENZA VACCINE  01/29/2022   COVID-19 Vaccine (3 - 2023-24 season) 03/01/2022   MAMMOGRAM  10/16/2022   PAP SMEAR-Modifier  08/16/2024   DTaP/Tdap/Td (3 - Td or Tdap) 08/04/2029   Hepatitis C Screening  Completed   HIV Screening  Completed   HPV VACCINES  Aged Out    Discussed health benefits of physical activity, and encouraged her to engage in regular exercise appropriate for her age and condition.  ***  No follow-ups on file.     {provider  attestation***:1}   MEulis Foster MD  [phone](phone) [phone](fax)  [place]

## 2022-08-19 ENCOUNTER — Encounter: Payer: BC Managed Care – PPO | Admitting: Family Medicine

## 2022-08-30 ENCOUNTER — Encounter: Payer: Self-pay | Admitting: Adult Health

## 2022-08-30 ENCOUNTER — Ambulatory Visit (INDEPENDENT_AMBULATORY_CARE_PROVIDER_SITE_OTHER): Payer: Self-pay | Admitting: Adult Health

## 2022-08-30 VITALS — BP 134/82 | HR 62 | Temp 98.0°F | Wt 200.0 lb

## 2022-08-30 DIAGNOSIS — R0981 Nasal congestion: Secondary | ICD-10-CM

## 2022-08-30 DIAGNOSIS — J3089 Other allergic rhinitis: Secondary | ICD-10-CM

## 2022-08-30 MED ORDER — LORATADINE 10 MG PO TABS: 10.0000 mg | ORAL_TABLET | Freq: Every day | ORAL | 3 refills | Status: DC

## 2022-08-30 NOTE — Progress Notes (Signed)
[facility] [address], [place] [address]   Office Visit Note  Patient Name: Barbara Kaiser Date of Birth [id number]  Medical Record number [id number]  Date of Service: 08/30/2022  Chief Complaint  Patient presents with   Sinusitis     Sinusitis Associated symptoms include sinus pressure. Pertinent negatives include no chills, coughing, ear pain or sore throat.   Pt is here for a sick visit. Yesterday she started having pressure under her eyes, and when she moves her head to the right the pain becomes worse.  She has some buzzing in her right ear.  Denies fever, chills, or cough.  Denies sick contacts.    Current Medication:  Outpatient Encounter Medications as of 08/30/2022  Medication Sig   amLODipine (NORVASC) 5 MG tablet Take 1 tablet (5 mg total) by mouth daily.   cyclobenzaprine (FLEXERIL) 10 MG tablet Take 1 tablet (10 mg total) by mouth 3 (three) times daily as needed for muscle spasms.   docusate sodium (COLACE) 100 MG capsule Take 1 capsule (100 mg total) by mouth 2 (two) times daily.   fluticasone (FLONASE) 50 MCG/ACT nasal spray Place 2 sprays into both nostrils daily.   loratadine (CLARITIN) 10 MG tablet Take 1 tablet (10 mg total) by mouth daily.   metoprolol tartrate (LOPRESSOR) 25 MG tablet Take 1 tablet (25 mg total) by mouth 2 (two) times daily.   simvastatin (ZOCOR) 20 MG tablet Take 1 tablet (20 mg total) by mouth daily.   linaclotide (LINZESS) 145 MCG CAPS capsule Take 1 capsule (145 mcg total) by mouth daily before breakfast. (Patient not taking: Reported on 02/08/2022)   oxyCODONE-acetaminophen (PERCOCET/ROXICET) 5-325 MG tablet Take 1-2 tablets by mouth every 4 (four) hours as needed for moderate pain. (Patient not taking: Reported on 07/10/2022)   No facility-administered encounter medications on file as of 08/30/2022.      Medical History: Past Medical History:  Diagnosis Date   Allergic rhinitis    Anemia, iron deficiency    Colon  polyps 99991111   Complication of anesthesia    hard to wake   Hypertension    PONV (postoperative nausea and vomiting)    Uterine fibroid    with heavy menses and anemia (resolved by ablation)     Vital Signs: BP 134/82   Pulse 62   Temp 98 F (36.7 C) (Tympanic)   Wt 200 lb (90.7 kg)   LMP 11/28/2011   SpO2 97%   BMI 31.32 kg/m    Review of Systems  Constitutional:  Negative for chills, fatigue and fever.  HENT:  Positive for sinus pressure and sinus pain. Negative for ear pain and sore throat.   Eyes:  Negative for pain and itching.  Respiratory:  Negative for cough.   Cardiovascular:  Negative for chest pain.  Gastrointestinal:  Negative for diarrhea, nausea and vomiting.    Physical Exam Vitals and nursing note reviewed.  Constitutional:      Appearance: Normal appearance.  HENT:     Head: Normocephalic.     Right Ear: Tympanic membrane and ear canal normal.     Left Ear: Tympanic membrane and ear canal normal.     Nose: Congestion present.     Right Turbinates: Pale.     Left Turbinates: Pale.     Mouth/Throat:     Mouth: Mucous membranes are moist.  Lymphadenopathy:     Cervical: No cervical adenopathy.  Neurological:     Mental Status: She is alert.  Assessment/Plan: 1. Non-seasonal allergic rhinitis, unspecified trigger Discussed using Daily allergy medication such as Zyrtec or Claritin. Also instructed patient to use Flonase, Two sprays in each nostril twice daily.  - loratadine (CLARITIN) 10 MG tablet; Take 1 tablet (10 mg total) by mouth daily.  Dispense: 30 tablet; Refill: 3  2. Nasal congestion Continue medications as discussed. Follow up via MyChart messenger if symptoms fail to improve or may return to clinic as needed for worsening symptoms.       General Counseling: Barbara Kaiser verbalizes understanding of the findings of todays visit and agrees with plan of treatment. I have discussed any further diagnostic evaluation that may be needed or  ordered today. We also reviewed her medications today. she has been encouraged to call the office with any questions or concerns that should arise related to todays visit.   No orders of the defined types were placed in this encounter.   Meds ordered this encounter  Medications   loratadine (CLARITIN) 10 MG tablet    Sig: Take 1 tablet (10 mg total) by mouth daily.    Dispense:  30 tablet    Refill:  3    Time spent:20 Minutes    Kendell Bane AGNP-C Nurse Practitioner

## 2022-09-08 ENCOUNTER — Other Ambulatory Visit: Payer: Self-pay | Admitting: Family Medicine

## 2022-09-08 DIAGNOSIS — I10 Essential (primary) hypertension: Secondary | ICD-10-CM

## 2022-09-09 ENCOUNTER — Other Ambulatory Visit: Payer: Self-pay | Admitting: Family Medicine

## 2022-09-09 DIAGNOSIS — I10 Essential (primary) hypertension: Secondary | ICD-10-CM

## 2022-09-19 ENCOUNTER — Other Ambulatory Visit: Payer: Self-pay | Admitting: Family Medicine

## 2022-09-19 DIAGNOSIS — Z1231 Encounter for screening mammogram for malignant neoplasm of breast: Secondary | ICD-10-CM

## 2022-11-14 ENCOUNTER — Ambulatory Visit
Admission: RE | Admit: 2022-11-14 | Discharge: 2022-11-14 | Disposition: A | Discharge status: Home / Self Care | Payer: BC Managed Care – PPO | Source: Ambulatory Visit | Attending: Family Medicine | Admitting: Family Medicine

## 2022-11-14 DIAGNOSIS — Z1231 Encounter for screening mammogram for malignant neoplasm of breast: Secondary | ICD-10-CM | POA: Insufficient documentation

## 2022-12-30 ENCOUNTER — Ambulatory Visit (INDEPENDENT_AMBULATORY_CARE_PROVIDER_SITE_OTHER): Payer: Self-pay | Admitting: Physician Assistant

## 2022-12-30 VITALS — BP 130/72 | HR 87 | Temp 96.9°F | Resp 16 | Wt 198.0 lb

## 2022-12-30 DIAGNOSIS — J3089 Other allergic rhinitis: Secondary | ICD-10-CM

## 2022-12-30 MED ORDER — MONTELUKAST SODIUM 10 MG PO TABS
10.0000 mg | ORAL_TABLET | Freq: Every day | ORAL | 3 refills | Status: DC
Start: 2022-12-30 — End: 2024-04-06

## 2022-12-30 NOTE — Progress Notes (Signed)
[facility] [address] [place], [place] [address]   Office Visit Note  Patient Name: Barbara Kaiser Date of Birth [id number]  Medical Record number [id number]  Date of Service: 12/30/2022  Chief Complaint  Patient presents with   URI     67 y/o F presents to the clinic for c/o right sided nasal congestion, post nasal drainage x 2 days. Denies fever, chills, cough, CP, SOB, or wheezing. Has used otc nasal spray and already taking otc anti-histamine.       Current Medication:  Outpatient Encounter Medications as of 12/30/2022  Medication Sig   montelukast (SINGULAIR) 10 MG tablet Take 1 tablet (10 mg total) by mouth at bedtime.   amLODipine (NORVASC) 5 MG tablet Take 1 tablet (5 mg total) by mouth daily.   cyclobenzaprine (FLEXERIL) 10 MG tablet Take 1 tablet (10 mg total) by mouth 3 (three) times daily as needed for muscle spasms.   docusate sodium (COLACE) 100 MG capsule Take 1 capsule (100 mg total) by mouth 2 (two) times daily.   fluticasone (FLONASE) 50 MCG/ACT nasal spray Place 2 sprays into both nostrils daily.   linaclotide (LINZESS) 145 MCG CAPS capsule Take 1 capsule (145 mcg total) by mouth daily before breakfast. (Patient not taking: Reported on 02/08/2022)   loratadine (CLARITIN) 10 MG tablet Take 1 tablet (10 mg total) by mouth daily.   metoprolol tartrate (LOPRESSOR) 25 MG tablet Take 1 tablet (25 mg total) by mouth 2 (two) times daily.   oxyCODONE-acetaminophen (PERCOCET/ROXICET) 5-325 MG tablet Take 1-2 tablets by mouth every 4 (four) hours as needed for moderate pain. (Patient not taking: Reported on 07/10/2022)   simvastatin (ZOCOR) 20 MG tablet Take 1 tablet (20 mg total) by mouth daily.   No facility-administered encounter medications on file as of 12/30/2022.      Medical History: Past Medical History:  Diagnosis Date   Allergic rhinitis    Anemia, iron deficiency    Colon polyps 3/10   Complication of anesthesia    hard to wake   Hypertension     PONV (postoperative nausea and vomiting)    Uterine fibroid    with heavy menses and anemia (resolved by ablation)     Vital Signs: BP 130/72 (BP Location: Left Arm, Patient Position: Sitting, Cuff Size: Normal)   Pulse 87   Temp (!) 96.9 F (36.1 C) (Tympanic)   Resp 16   Wt 198 lb (89.8 kg)   LMP 11/28/2011   SpO2 97%   BMI 31.01 kg/m    Review of Systems  Constitutional: Negative.   HENT:  Positive for congestion and postnasal drip. Negative for rhinorrhea, sinus pressure, sinus pain, sore throat and trouble swallowing.   Respiratory: Negative.    Cardiovascular: Negative.   Neurological: Negative.     Physical Exam Constitutional:      Appearance: Normal appearance.  HENT:     Head: Atraumatic.     Right Ear: External ear normal. There is impacted cerumen.     Left Ear: External ear normal. There is impacted cerumen.     Nose: Nose normal.     Right Turbinates: Enlarged, swollen and pale.     Left Turbinates: Enlarged, swollen and pale.     Mouth/Throat:     Mouth: Mucous membranes are moist.     Pharynx: Oropharynx is clear.  Eyes:     Extraocular Movements: Extraocular movements intact.  Cardiovascular:     Rate and Rhythm: Normal rate and regular  rhythm.  Pulmonary:     Effort: Pulmonary effort is normal.     Breath sounds: Normal breath sounds.  Musculoskeletal:     Cervical back: Neck supple.  Skin:    General: Skin is warm.  Neurological:     Mental Status: She is alert.  Psychiatric:        Mood and Affect: Mood normal.        Behavior: Behavior normal.        Thought Content: Thought content normal.        Judgment: Judgment normal.       Assessment/Plan:  1. Non-seasonal allergic rhinitis, unspecified trigger - montelukast (SINGULAIR) 10 MG tablet; Take 1 tablet (10 mg total) by mouth at bedtime.  Dispense: 30 tablet; Refill: 3  Use nasal spray twice daily Continue with otc anti-histamine as previously instructed Start new medicine  as prescribed Take otc Coricidin as a decongestant if needed. Pt verbalized understanding and in agreement.   General Counseling: Barbara Kaiser verbalizes understanding of the findings of todays visit and agrees with plan of treatment. I have discussed any further diagnostic evaluation that may be needed or ordered today. We also reviewed her medications today. she has been encouraged to call the office with any questions or concerns that should arise related to todays visit.    Time spent:20 Minutes    Gilberto Better, [place] Physician Assistant

## 2023-01-28 ENCOUNTER — Ambulatory Visit (INDEPENDENT_AMBULATORY_CARE_PROVIDER_SITE_OTHER): Payer: Self-pay | Admitting: Physician Assistant

## 2023-01-28 ENCOUNTER — Encounter: Payer: Self-pay | Admitting: Physician Assistant

## 2023-01-28 ENCOUNTER — Other Ambulatory Visit: Payer: Self-pay

## 2023-01-28 VITALS — BP 134/80 | HR 71 | Ht 67.5 in | Wt 202.0 lb

## 2023-01-28 DIAGNOSIS — R051 Acute cough: Secondary | ICD-10-CM

## 2023-01-28 DIAGNOSIS — J069 Acute upper respiratory infection, unspecified: Secondary | ICD-10-CM

## 2023-01-28 DIAGNOSIS — H1013 Acute atopic conjunctivitis, bilateral: Secondary | ICD-10-CM

## 2023-01-28 LAB — POC COVID19 BINAXNOW

## 2023-01-28 NOTE — Progress Notes (Signed)
[facility] [address] [place], [place] [address]   Office Visit Note  Patient Name: Barbara Kaiser Date of Birth [id number]  Medical Record number [id number]  Date of Service: 01/28/2023  Chief Complaint  Patient presents with   Acute Visit    Patient c/o red, itchy eyes with and nasal congestion. Symptoms began about 3 days ago. She states both eyes are "crusty" in the mornings and sealed shut. When she coughs, she has yellow/green mucous production.  She took a home COVID test yesterday which was negative.     67 y/o F presents to the clinic for c/o red and itchy eys R>L, nasal congestion, cough, sneezing, watery eyes x 3 days. Has been using nasal spray with little relief. No known exposure to strep, covid, or flu. Denies fever, CP, SOB, body aches.       Current Medication:  Outpatient Encounter Medications as of 01/28/2023  Medication Sig   amLODipine (NORVASC) 5 MG tablet Take 1 tablet (5 mg total) by mouth daily.   docusate sodium (COLACE) 100 MG capsule Take 1 capsule (100 mg total) by mouth 2 (two) times daily. (Patient taking differently: Take 100 mg by mouth 2 (two) times daily as needed.)   fluticasone (FLONASE) 50 MCG/ACT nasal spray Place 2 sprays into both nostrils daily.   loratadine (CLARITIN) 10 MG tablet Take 1 tablet (10 mg total) by mouth daily. (Patient taking differently: Take 10 mg by mouth daily as needed.)   methocarbamol (ROBAXIN) 500 MG tablet Take 500 mg by mouth as needed for muscle spasms.   metoprolol tartrate (LOPRESSOR) 25 MG tablet Take 1 tablet (25 mg total) by mouth 2 (two) times daily.   montelukast (SINGULAIR) 10 MG tablet Take 1 tablet (10 mg total) by mouth at bedtime.   simvastatin (ZOCOR) 20 MG tablet Take 1 tablet (20 mg total) by mouth daily.   [DISCONTINUED] cyclobenzaprine (FLEXERIL) 10 MG tablet Take 1 tablet (10 mg total) by mouth 3 (three) times daily as needed for muscle spasms.   [DISCONTINUED] linaclotide (LINZESS) 145  MCG CAPS capsule Take 1 capsule (145 mcg total) by mouth daily before breakfast. (Patient not taking: Reported on 02/08/2022)   [DISCONTINUED] oxyCODONE-acetaminophen (PERCOCET/ROXICET) 5-325 MG tablet Take 1-2 tablets by mouth every 4 (four) hours as needed for moderate pain.   No facility-administered encounter medications on file as of 01/28/2023.      Medical History: Past Medical History:  Diagnosis Date   Allergic rhinitis    Anemia, iron deficiency    Colon polyps 3/10   Complication of anesthesia    hard to wake   Hypertension    PONV (postoperative nausea and vomiting)    Uterine fibroid    with heavy menses and anemia (resolved by ablation)     Vital Signs: BP 134/80 (BP Location: Left Arm, Patient Position: Sitting, Cuff Size: Normal)   Pulse 71   Ht 5' 7.5" (1.715 m)   Wt 202 lb (91.6 kg)   LMP 11/28/2011   SpO2 98%   BMI 31.17 kg/m    Review of Systems  Constitutional: Negative.   HENT:  Positive for congestion and rhinorrhea. Negative for postnasal drip, sinus pressure, sinus pain, sore throat and trouble swallowing.   Eyes:  Positive for redness and itching. Negative for photophobia, pain, discharge (watery) and visual disturbance.  Respiratory: Negative.    Cardiovascular: Negative.   Neurological: Negative.     Physical Exam Constitutional:      Appearance: Normal  appearance.  HENT:     Head: Atraumatic.     Right Ear: External ear normal. There is impacted cerumen.     Left Ear: External ear normal. There is impacted cerumen.     Nose: Nose normal.     Left Turbinates: Enlarged and swollen.     Mouth/Throat:     Mouth: Mucous membranes are moist.     Pharynx: Oropharynx is clear.  Eyes:     General: Lids are normal. Lids are everted, no foreign bodies appreciated.        Right eye: No discharge.        Left eye: No discharge.     Extraocular Movements: Extraocular movements intact.     Conjunctiva/sclera:     Right eye: Right conjunctiva is  injected.     Left eye: Left conjunctiva is injected.     Pupils: Pupils are equal, round, and reactive to light.     Comments: B/l conjunctivae slightly injected.   Cardiovascular:     Rate and Rhythm: Normal rate and regular rhythm.  Pulmonary:     Effort: Pulmonary effort is normal.     Breath sounds: Normal breath sounds.  Musculoskeletal:     Cervical back: Neck supple.  Skin:    General: Skin is warm.  Neurological:     Mental Status: She is alert.  Psychiatric:        Mood and Affect: Mood normal.        Behavior: Behavior normal.        Thought Content: Thought content normal.        Judgment: Judgment normal.       Assessment/Plan:  1. Acute cough - POC COVID-19 BinaxNow  2. Viral upper respiratory tract infection  3. Allergic conjunctivitis of both eyes  Reviewed negative covid test result with patient. Reviewed my clinical findings with patient. Start otc Zaditor eye drops as directed on the box. Take otc oral anti-histamine. Continue to watch for worsening symptoms. Increase fluids Take otc Delsym for dry cough as directed on the box.  Pt verbalized understanding and in agreement.   General Counseling: Barbara Kaiser verbalizes understanding of the findings of todays visit and agrees with plan of treatment. I have discussed any further diagnostic evaluation that may be needed or ordered today. We also reviewed her medications today. she has been encouraged to call the office with any questions or concerns that should arise related to todays visit.    Time spent:20 Minutes    Gilberto Better, [place] Physician Assistant

## 2023-04-14 ENCOUNTER — Ambulatory Visit (INDEPENDENT_AMBULATORY_CARE_PROVIDER_SITE_OTHER): Payer: Self-pay | Admitting: Physician Assistant

## 2023-04-14 VITALS — BP 140/76 | HR 65 | Temp 98.0°F | Resp 16 | Wt 200.0 lb

## 2023-04-14 DIAGNOSIS — M79671 Pain in right foot: Secondary | ICD-10-CM

## 2023-04-14 MED ORDER — NAPROXEN 500 MG PO TABS
500.0000 mg | ORAL_TABLET | Freq: Two times a day (BID) | ORAL | 0 refills | Status: DC
Start: 2023-04-14 — End: 2024-01-15

## 2023-04-14 NOTE — Progress Notes (Signed)
[facility] [address] [place], [place] [address]   Office Visit Note  Patient Name: Barbara Kaiser Date of Birth [id number]  Medical Record number [id number]  Date of Service: 04/14/2023  Chief Complaint  Patient presents with   Foot Pain     67 y/o F presents to the clinic for c/o R foot pain and swelling x 2 days. Pt states she woke up with slight pain after waking up on Saturday. As the day progressed her pain worsened, but worse yesterday and today. She denies direct trauma, twisting injury, or recent MVA. Worse pain with standing for extended period of time or walking. No pain with sitting.   Foot Pain      Current Medication:  Outpatient Encounter Medications as of 04/14/2023  Medication Sig   naproxen (NAPROSYN) 500 MG tablet Take 1 tablet (500 mg total) by mouth 2 (two) times daily with a meal.   amLODipine (NORVASC) 5 MG tablet Take 1 tablet (5 mg total) by mouth daily.   docusate sodium (COLACE) 100 MG capsule Take 1 capsule (100 mg total) by mouth 2 (two) times daily. (Patient taking differently: Take 100 mg by mouth 2 (two) times daily as needed.)   fluticasone (FLONASE) 50 MCG/ACT nasal spray Place 2 sprays into both nostrils daily.   loratadine (CLARITIN) 10 MG tablet Take 1 tablet (10 mg total) by mouth daily. (Patient taking differently: Take 10 mg by mouth daily as needed.)   methocarbamol (ROBAXIN) 500 MG tablet Take 500 mg by mouth as needed for muscle spasms.   metoprolol tartrate (LOPRESSOR) 25 MG tablet Take 1 tablet (25 mg total) by mouth 2 (two) times daily.   montelukast (SINGULAIR) 10 MG tablet Take 1 tablet (10 mg total) by mouth at bedtime.   simvastatin (ZOCOR) 20 MG tablet Take 1 tablet (20 mg total) by mouth daily.   No facility-administered encounter medications on file as of 04/14/2023.      Medical History: Past Medical History:  Diagnosis Date   Allergic rhinitis    Anemia, iron deficiency    Colon polyps 3/10    Complication of anesthesia    hard to wake   Hypertension    PONV (postoperative nausea and vomiting)    Uterine fibroid    with heavy menses and anemia (resolved by ablation)     Vital Signs: BP (!) 140/76 (BP Location: Left Arm, Patient Position: Sitting, Cuff Size: Normal)   Pulse 65   Temp 98 F (36.7 C) (Tympanic)   Resp 16   Wt 200 lb (90.7 kg)   LMP 11/28/2011   SpO2 99%   BMI 30.86 kg/m    Review of Systems  Constitutional: Negative.   HENT: Negative.    Musculoskeletal:  Positive for gait problem.  Skin: Negative.     Physical Exam Constitutional:      Appearance: Normal appearance.  HENT:     Head: Normocephalic and atraumatic.     Right Ear: External ear normal.     Left Ear: External ear normal.  Eyes:     Extraocular Movements: Extraocular movements intact.  Musculoskeletal:     Left foot: Normal range of motion. Tenderness present. No bony tenderness.       Legs:     Comments: Right Foot: +TTP over the lateral mid foot. NO ecchymosis. ROM is essentially normal.  NT distal Achilles tendon insertion.  Sensation is grossly normal. Lateral and medial malleolus is non-tender. Toes are non-painful to palpate. Pedal  pulses 2+ and  symmetrical Antalgic gait.   Skin:    General: Skin is warm and dry.  Neurological:     Mental Status: She is alert and oriented to person, place, and time.  Psychiatric:        Mood and Affect: Mood normal.        Behavior: Behavior normal.       Assessment/Plan:  1. Right foot pain - naproxen (NAPROSYN) 500 MG tablet; Take 1 tablet (500 mg total) by mouth 2 (two) times daily with a meal.  Dispense: 30 tablet; Refill: 0  I reviewed my clinical findings with patient. Must keep R foot elevated and apply ice every 2-3 hrs for 20 mins each time. Will defer x-rays since no trauma or twisting injury to her foot.  Take medicine as prescribed. Limited extended walking and standing RTC in 2 days or sooner if any  difficulty arises. Pt verbalized understanding and in agreement.    General Counseling: Akiva verbalizes understanding of the findings of todays visit and agrees with plan of treatment. I have discussed any further diagnostic evaluation that may be needed or ordered today. We also reviewed her medications today. she has been encouraged to call the office with any questions or concerns that should arise related to todays visit.    Time spent:20 Minutes    Gilberto Better, [place] Physician Assistant

## 2023-04-23 ENCOUNTER — Ambulatory Visit (INDEPENDENT_AMBULATORY_CARE_PROVIDER_SITE_OTHER): Payer: Self-pay

## 2023-04-23 DIAGNOSIS — Z23 Encounter for immunization: Secondary | ICD-10-CM

## 2023-10-22 ENCOUNTER — Other Ambulatory Visit: Payer: Self-pay | Admitting: Family Medicine

## 2023-10-22 DIAGNOSIS — Z1231 Encounter for screening mammogram for malignant neoplasm of breast: Secondary | ICD-10-CM

## 2023-11-10 ENCOUNTER — Encounter (HOSPITAL_COMMUNITY): Payer: Self-pay

## 2023-11-17 ENCOUNTER — Ambulatory Visit: Admission: RE | Admit: 2023-11-17 | Source: Ambulatory Visit

## 2023-11-17 ENCOUNTER — Ambulatory Visit: Payer: Self-pay

## 2023-11-17 VITALS — BP 143/61 | HR 75 | Temp 97.6°F | Ht 67.5 in | Wt 199.0 lb

## 2023-11-17 DIAGNOSIS — I10 Essential (primary) hypertension: Secondary | ICD-10-CM

## 2023-11-17 NOTE — Progress Notes (Signed)
 error

## 2023-12-02 ENCOUNTER — Ambulatory Visit
Admission: RE | Admit: 2023-12-02 | Discharge: 2023-12-02 | Disposition: A | Discharge status: Home / Self Care | Source: Ambulatory Visit | Attending: Family Medicine | Admitting: Family Medicine

## 2023-12-02 DIAGNOSIS — Z1231 Encounter for screening mammogram for malignant neoplasm of breast: Secondary | ICD-10-CM | POA: Insufficient documentation

## 2024-01-15 ENCOUNTER — Ambulatory Visit (INDEPENDENT_AMBULATORY_CARE_PROVIDER_SITE_OTHER): Payer: Self-pay | Admitting: Medical

## 2024-01-15 ENCOUNTER — Other Ambulatory Visit: Payer: Self-pay

## 2024-01-15 ENCOUNTER — Encounter: Payer: Self-pay | Admitting: Medical

## 2024-01-15 VITALS — BP 148/79 | HR 58 | Temp 96.6°F | Ht 67.5 in | Wt 200.0 lb

## 2024-01-15 DIAGNOSIS — R519 Headache, unspecified: Secondary | ICD-10-CM

## 2024-01-15 DIAGNOSIS — J309 Allergic rhinitis, unspecified: Secondary | ICD-10-CM

## 2024-01-15 MED ORDER — PREDNISONE 20 MG PO TABS
40.0000 mg | ORAL_TABLET | Freq: Every day | ORAL | 0 refills | Status: AC
Start: 2024-01-15 — End: 2024-01-19

## 2024-01-15 NOTE — Patient Instructions (Signed)
-  Take prednisone  with food. Avoid taking close to bedtime, asit may make it harder to fall asleep. -Start taking Claritin  again once a day. -Keep using Flonase /Fluticasone  nasal spray, 2 sprays to each nostril once a day. -Consider using a neti pot or saline sinus rinses. -Rest and stay well hydrated (by drinking water and other liquids).  -Send MyChart message to provider or schedule return visit as needed for new/worsening symptoms or if symptoms do not improve as discussed with recommended treatment.  -Go to emergency department with severe or worsening headache, vision changes, difficulty speaking, dizziness, numbness or weakness.

## 2024-01-15 NOTE — Progress Notes (Signed)
 [facility]rap[facility] [address] [place], [place] [address]   Office Visit Note  Patient Name: Barbara Kaiser Date of Birth [id number]  Medical Record number [id number]  Date of Service: 01/15/2024  Chief Complaint  Patient presents with   Sinusitis    Patient c/o R-sided sinus pain/pressure. Symptoms began earlier this week. She uses Flonase  and Sudafed occasionally and takes Claritin  daily. She has not been doing saline rinses because she is out of saline.      68 YO female presents for possible sinus infection/sinusitis.  Has hx of sinusitis, SP sinus surgery in 2007 and 2020. Also has seasonal allergies. Ran out of her Claritin  about a month ago, usually takes daily.   Noting pressure/pain to right maxillary/frontal sinus area. Sometimes has associated right frontal HA. No fever or chills. No nasal discharge or PND. No cough. No unusual fatigue.  Has been using Flonase  2STEN daily last few days. Has also used some Sudafed, last dose yesterday. Took Tylenol  yesterday, some helpful.  Has not had sinus infection in awhile, has had summer sinus infection in past.   No recent abx. Has used steroids in past for sinus sx.   No dizziness, nausea, vomiting or vision changes.   Current Medication:  Outpatient Encounter Medications as of 01/15/2024  Medication Sig   amLODipine  (NORVASC ) 5 MG tablet Take 1 tablet (5 mg total) by mouth daily.   docusate sodium  (COLACE) 100 MG capsule Take 1 capsule (100 mg total) by mouth 2 (two) times daily. (Patient taking differently: Take 100 mg by mouth 2 (two) times daily as needed.)   ezetimibe (ZETIA) 10 MG tablet Take 1 tablet by mouth daily.   fluticasone  (FLONASE ) 50 MCG/ACT nasal spray Place 2 sprays into both nostrils daily. (Patient taking differently: Place 2 sprays into both nostrils daily as needed.)   loratadine  (CLARITIN ) 10 MG tablet Take 1 tablet (10 mg total) by mouth daily.   methocarbamol (ROBAXIN) 500 MG tablet Take 500 mg by  mouth as needed for muscle spasms.   metoprolol  tartrate (LOPRESSOR ) 25 MG tablet Take 1 tablet (25 mg total) by mouth 2 (two) times daily.   montelukast  (SINGULAIR ) 10 MG tablet Take 1 tablet (10 mg total) by mouth at bedtime. (Patient taking differently: Take 10 mg by mouth at bedtime as needed.)   [DISCONTINUED] naproxen  (NAPROSYN ) 500 MG tablet Take 1 tablet (500 mg total) by mouth 2 (two) times daily with a meal.   [DISCONTINUED] simvastatin  (ZOCOR ) 20 MG tablet Take 1 tablet (20 mg total) by mouth daily.   No facility-administered encounter medications on file as of 01/15/2024.      Medical History: Past Medical History:  Diagnosis Date   Allergic rhinitis    Anemia, iron deficiency    Colon polyps 3/10   Complication of anesthesia    hard to wake   Hypertension    PONV (postoperative nausea and vomiting)    Uterine fibroid    with heavy menses and anemia (resolved by ablation)     Vital Signs: BP (!) 148/79   Pulse (!) 58   Temp (!) 96.6 F (35.9 C)   Ht 5' 7.5 (1.715 m)   Wt 200 lb (90.7 kg)   LMP 11/28/2011   SpO2 98%   BMI 30.86 kg/m   Did take BP med today.  Review of Systems  Constitutional:  Negative for chills, fatigue and fever.  HENT:  Positive for congestion (mild), sinus pressure, sinus pain and sneezing (mild). Negative  for ear pain, postnasal drip, rhinorrhea and sore throat.   Eyes:  Negative for itching.  Respiratory:  Negative for cough and shortness of breath.   Cardiovascular:  Negative for chest pain.  Gastrointestinal:  Negative for nausea and vomiting.  Musculoskeletal:  Negative for myalgias, neck pain and neck stiffness.  Neurological:  Positive for headaches. Negative for dizziness.    Physical Exam Vitals reviewed.  Constitutional:      General: She is not in acute distress.    Appearance: She is not ill-appearing.  HENT:     Head: Normocephalic.     Right Ear: Tympanic membrane, ear canal and external ear normal.     Left  Ear: Tympanic membrane, ear canal and external ear normal.     Nose: No mucosal edema, congestion or rhinorrhea.     Right Turbinates: Pale. Not swollen.     Left Turbinates: Pale. Not swollen.     Right Sinus: Maxillary sinus tenderness (mild) and frontal sinus tenderness (mild) present.     Left Sinus: No maxillary sinus tenderness or frontal sinus tenderness.     Mouth/Throat:     Mouth: Mucous membranes are moist. No oral lesions.     Pharynx: No pharyngeal swelling or posterior oropharyngeal erythema.     Tonsils: No tonsillar exudate.  Cardiovascular:     Rate and Rhythm: Normal rate and regular rhythm.     Heart sounds: No murmur heard.    No friction rub. No gallop.  Pulmonary:     Effort: Pulmonary effort is normal.     Breath sounds: Normal breath sounds. No wheezing, rhonchi or rales.  Musculoskeletal:     Cervical back: Neck supple. No rigidity.  Lymphadenopathy:     Cervical: No cervical adenopathy.  Neurological:     Mental Status: She is alert.       Assessment/Plan: 1. Sinus headache (Primary)  - predniSONE  (DELTASONE ) 20 MG tablet; Take 2 tablets (40 mg total) by mouth daily with breakfast for 4 days.  Dispense: 8 tablet; Refill: 0  2. Allergic rhinitis, unspecified seasonality, unspecified trigger  Suspect sinus inflammation/headache, most likely secondary to allergic rhinosinusitis. No suspicion for bacterial infection based on presentation/sx, so do not feel antibiotics are appropriate. Will give short course of Prednisone  to address sinus inflammation. Advised to continue Flonase  and resume Claritin . Advised to follow up as needed for new/worsening symptoms.     General Counseling: Barbara Kaiser verbalizes understanding of the findings of todays visit and plan of treatment.  she has been encouraged to call the office with any questions or concerns that should arise related to todays visit.   Time spent:20 Minutes    Joen Arts PA-C Physician  Assistant

## 2024-01-26 ENCOUNTER — Emergency Department
Admission: EM | Admit: 2024-01-26 | Discharge: 2024-01-27 | Disposition: A | Discharge status: Home / Self Care | Attending: Emergency Medicine | Admitting: Emergency Medicine

## 2024-01-26 ENCOUNTER — Other Ambulatory Visit: Payer: Self-pay

## 2024-01-26 ENCOUNTER — Emergency Department

## 2024-01-26 DIAGNOSIS — S0990XA Unspecified injury of head, initial encounter: Secondary | ICD-10-CM | POA: Diagnosis not present

## 2024-01-26 DIAGNOSIS — W010XXA Fall on same level from slipping, tripping and stumbling without subsequent striking against object, initial encounter: Secondary | ICD-10-CM | POA: Insufficient documentation

## 2024-01-26 DIAGNOSIS — Z79899 Other long term (current) drug therapy: Secondary | ICD-10-CM | POA: Insufficient documentation

## 2024-01-26 DIAGNOSIS — S8001XA Contusion of right knee, initial encounter: Secondary | ICD-10-CM | POA: Insufficient documentation

## 2024-01-26 DIAGNOSIS — S8991XA Unspecified injury of right lower leg, initial encounter: Secondary | ICD-10-CM | POA: Diagnosis present

## 2024-01-26 DIAGNOSIS — Y92002 Bathroom of unspecified non-institutional (private) residence single-family (private) house as the place of occurrence of the external cause: Secondary | ICD-10-CM | POA: Diagnosis not present

## 2024-01-26 DIAGNOSIS — W19XXXA Unspecified fall, initial encounter: Secondary | ICD-10-CM

## 2024-01-26 DIAGNOSIS — S8011XA Contusion of right lower leg, initial encounter: Secondary | ICD-10-CM | POA: Diagnosis not present

## 2024-01-26 DIAGNOSIS — I10 Essential (primary) hypertension: Secondary | ICD-10-CM | POA: Diagnosis not present

## 2024-01-26 NOTE — ED Triage Notes (Signed)
 Pt to ED via EMS from home, pt fell in shower and injured her right knee and lower leg. Pt was given 150mcg fentanyl  4mg  zofran  by ems. Pt reports when she fell she also hit her mouth.

## 2024-01-27 ENCOUNTER — Emergency Department

## 2024-01-27 MED ORDER — IBUPROFEN 800 MG PO TABS: 800.0000 mg | ORAL_TABLET | Freq: Three times a day (TID) | ORAL | 0 refills | Status: DC | PRN

## 2024-01-27 MED ORDER — ONDANSETRON 4 MG PO TBDP
4.0000 mg | ORAL_TABLET | Freq: Once | ORAL | Status: AC
  Administered 2024-01-27: 4 mg via ORAL
  Filled 2024-01-27: qty 1

## 2024-01-27 MED ORDER — ONDANSETRON 4 MG PO TBDP: 4.0000 mg | ORAL_TABLET | Freq: Four times a day (QID) | ORAL | 0 refills | Status: DC | PRN

## 2024-01-27 MED ORDER — HYDROCODONE-ACETAMINOPHEN 5-325 MG PO TABS
2.0000 | ORAL_TABLET | Freq: Once | ORAL | Status: AC
  Administered 2024-01-27: 2 via ORAL
  Filled 2024-01-27: qty 2

## 2024-01-27 MED ORDER — HYDROCODONE-ACETAMINOPHEN 5-325 MG PO TABS: 2.0000 | ORAL_TABLET | Freq: Three times a day (TID) | ORAL | 0 refills | Status: DC | PRN

## 2024-01-27 NOTE — ED Notes (Signed)
 This tech and EDT Harlene gave pt a walker per request from pt's doctor.

## 2024-01-27 NOTE — ED Notes (Signed)
 Patient given discharge instructions including prescriptions x2 and importance of follow up appt as needed with stated understanding. INT removed, cannula intact, pressure dressing applied. Patient stable and wheeled to car for ride with daughter.

## 2024-01-27 NOTE — Discharge Instructions (Addendum)

## 2024-01-27 NOTE — ED Provider Notes (Signed)
 [facility] Provider Note    Event Date/Time   First MD Initiated Contact with Patient 01/27/24 0000     (approximate)   History   Fall   HPI  Barbara Kaiser is a 68 y.o. female with history of iron deficiency anemia, uterine fibroids, hypertension who presents to the emergency department for she slipped and fell in the bathroom.  Did not hit her head, face and injured her right knee, right tibia/fibula and has a large bruise to this area.  Not on any blood thinners.  Denies neck or back pain, chest or abdominal pain.  No preceding symptoms that led to her fall.   History provided by patient and family.    Past Medical History:  Diagnosis Date   Allergic rhinitis    Anemia, iron deficiency    Colon polyps 3/10   Complication of anesthesia    hard to wake   Hypertension    PONV (postoperative nausea and vomiting)    Uterine fibroid    with heavy menses and anemia (resolved by ablation)    Past Surgical History:  Procedure Laterality Date   ABDOMINAL HYSTERECTOMY     CARDIAC CATHETERIZATION  05/2005   mild CAD   DILATION AND CURETTAGE OF UTERUS  12/08   hysteroscopy and endometrial ablation   ETHMOIDECTOMY Bilateral 06/18/2019   Procedure: ETHMOIDECTOMY;  Surgeon: Karis Clunes, MD;  Location: [facility];  Service: ENT;  Laterality: Bilateral;   FRONTAL SINUS EXPLORATION Bilateral 06/18/2019   Procedure: FRONTAL SINUS EXPLORATION;  Surgeon: Karis Clunes, MD;  Location: [facility];  Service: ENT;  Laterality: Bilateral;   LAPAROSCOPIC SUPRACERVICAL HYSTERECTOMY  12/09/2011   Procedure: LAPAROSCOPIC SUPRACERVICAL HYSTERECTOMY;  Surgeon: Gloris DELENA Hugger, MD;  Location: [facility];  Service: Gynecology;  Laterality: N/A;   MAXILLARY ANTROSTOMY Bilateral 06/18/2019   Procedure: MAXILLARY ANTROSTOMY;  Surgeon: Karis Clunes, MD;  Location: [facility];  Service: ENT;  Laterality: Bilateral;   NASAL SINUS SURGERY  11/07    SALPINGOOPHORECTOMY  12/09/2011   Procedure: SALPINGO OOPHERECTOMY;  Surgeon: Gloris DELENA Hugger, MD;  Location: [facility];  Service: Gynecology;  Laterality: Bilateral;   SINUS ENDO WITH FUSION Bilateral 06/18/2019   Procedure: SINUS ENDO WITH FUSION;  Surgeon: Karis Clunes, MD;  Location: [facility];  Service: ENT;  Laterality: Bilateral;   SPHENOIDECTOMY Bilateral 06/18/2019   Procedure: SPHENOIDECTOMY WITH TISSUE REMOVAL;  Surgeon: Karis Clunes, MD;  Location: [facility];  Service: ENT;  Laterality: Bilateral;   SUPRACERVICAL ABDOMINAL HYSTERECTOMY  12/09/2011   Procedure: HYSTERECTOMY SUPRACERVICAL ABDOMINAL;  Surgeon: Gloris DELENA Hugger, MD;  Location: [facility];  Service: Gynecology;  Laterality: N/A;   TUBAL LIGATION     TURBINATE REDUCTION Bilateral 06/18/2019   Procedure: TURBINATE REDUCTION;  Surgeon: Karis Clunes, MD;  Location: [facility];  Service: ENT;  Laterality: Bilateral;    MEDICATIONS:  Prior to Admission medications   Medication Sig Start Date End Date Taking? Authorizing Provider  amLODipine  (NORVASC ) 5 MG tablet Take 1 tablet (5 mg total) by mouth daily. 06/13/22   Simmons-Robinson, Rockie, MD  docusate sodium  (COLACE) 100 MG capsule Take 1 capsule (100 mg total) by mouth 2 (two) times daily. Patient taking differently: Take 100 mg by mouth 2 (two) times daily as needed. 02/22/22   Mavis Purchase, MD  ezetimibe (ZETIA) 10 MG tablet Take 1 tablet by mouth daily. 11/11/23 11/10/24  [provider]  fluticasone  (FLONASE ) 50 MCG/ACT  nasal spray Place 2 sprays into both nostrils daily. Patient taking differently: Place 2 sprays into both nostrils daily as needed. 07/10/22   Scarboro, Adam J, NP  loratadine  (CLARITIN ) 10 MG tablet Take 1 tablet (10 mg total) by mouth daily. 08/30/22   Scarboro, Adam J, NP  methocarbamol (ROBAXIN) 500 MG tablet Take 500 mg by mouth as needed for muscle spasms.    [provider]  metoprolol  tartrate  (LOPRESSOR ) 25 MG tablet Take 1 tablet (25 mg total) by mouth 2 (two) times daily. 06/13/22   Simmons-Robinson, Rockie, MD  montelukast  (SINGULAIR ) 10 MG tablet Take 1 tablet (10 mg total) by mouth at bedtime. Patient taking differently: Take 10 mg by mouth at bedtime as needed. 12/30/22   Lucienne Loveless, PA-C    Physical Exam   Triage Vital Signs: ED Triage Vitals  Encounter Vitals Group     BP 01/26/24 2322 (!) 164/82     Girls Systolic BP Percentile --      Girls Diastolic BP Percentile --      Boys Systolic BP Percentile --      Boys Diastolic BP Percentile --      Pulse Rate 01/26/24 2322 62     Resp 01/26/24 2322 16     Temp 01/26/24 2322 97.8 F (36.6 C)     Temp src --      SpO2 01/26/24 2322 97 %     Weight 01/26/24 2323 206 lb (93.4 kg)     Height 01/26/24 2323 5' 7 (1.702 m)     Head Circumference --      Peak Flow --      Pain Score 01/26/24 2323 5     Pain Loc --      Pain Education --      Exclude from Growth Chart --     Most recent vital signs: Vitals:   01/26/24 2322 01/27/24 0248  BP: (!) 164/82 (!) 178/87  Pulse: 62 74  Resp: 16 16  Temp: 97.8 F (36.6 C) 97.8 F (36.6 C)  SpO2: 97% 100%     CONSTITUTIONAL: Alert, responds appropriately to questions. Well-appearing; well-nourished; GCS 15 HEAD: Normocephalic; atraumatic EYES: Conjunctivae clear, PERRL, EOMI ENT: normal nose; no rhinorrhea; moist mucous membranes; pharynx without lesions noted; no dental injury; no septal hematoma, no epistaxis; no facial deformity or bony tenderness NECK: Supple, no midline spinal tenderness, step-off or deformity; trachea midline CARD: RRR; S1 and S2 appreciated; no murmurs, no clicks, no rubs, no gallops RESP: Normal chest excursion without splinting or tachypnea; breath sounds clear and equal bilaterally; no wheezes, no rhonchi, no rales; no hypoxia or respiratory distress CHEST:  chest wall stable, no crepitus or ecchymosis or deformity, nontender to palpation;  no flail chest ABD/GI: Non-distended; soft, non-tender, no rebound, no guarding; no ecchymosis or other lesions noted PELVIS:  stable, nontender to palpation BACK:  The back appears normal; no midline spinal tenderness, step-off or deformity EXT: Tender over the right knee and right shin with associated bruising and soft tissue swelling.  Compartments soft.  Extremities warm well-perfused.  No calf tenderness.  Normal sensation. SKIN: Normal color for age and race; warm NEURO: No facial asymmetry, normal speech, moving all extremities equally  ED Results / Procedures / Treatments   LABS: (all labs ordered are listed, but only abnormal results are displayed) Labs Reviewed - No data to display   EKG:  EKG Interpretation Date/Time:    Ventricular Rate:    PR Interval:  QRS Duration:    QT Interval:    QTC Calculation:   R Axis:      Text Interpretation:            RADIOLOGY: My personal review and interpretation of imaging: X-rays, CT scan showed no acute traumatic injury.  I have personally reviewed all radiology reports. DG Tibia/Fibula Right Result Date: 01/27/2024 CLINICAL DATA:  Fall, right leg pain EXAM: RIGHT TIBIA AND FIBULA - 2 VIEW COMPARISON:  None Available. FINDINGS: There is no evidence of fracture or other focal bone lesions. Soft tissues are unremarkable. IMPRESSION: Negative. Electronically Signed   By: Dorethia Molt M.D.   On: 01/27/2024 00:29   CT Maxillofacial Wo Contrast Result Date: 01/27/2024 CLINICAL DATA:  Head trauma, minor (Age >= 65y); Facial trauma, blunt; Neck trauma (Age >= 65y). Fall EXAM: CT HEAD WITHOUT CONTRAST CT MAXILLOFACIAL WITHOUT CONTRAST CT CERVICAL SPINE WITHOUT CONTRAST TECHNIQUE: Multidetector CT imaging of the head, cervical spine, and maxillofacial structures were performed using the standard protocol without intravenous contrast. Multiplanar CT image reconstructions of the cervical spine and maxillofacial structures were also  generated. RADIATION DOSE REDUCTION: This exam was performed according to the departmental dose-optimization program which includes automated exposure control, adjustment of the mA and/or kV according to patient size and/or use of iterative reconstruction technique. COMPARISON:  None Available. FINDINGS: CT HEAD FINDINGS Brain: Normal anatomic configuration. Mild periventricular white matter changes are present likely reflecting the sequela of small vessel ischemia. No abnormal intra or extra-axial mass lesion or fluid collection. No abnormal mass effect or midline shift. No evidence of acute intracranial hemorrhage or infarct. Ventricular size is normal. Cerebellum unremarkable. Vascular: No asymmetric hyperdense vasculature at the skull base. Skull: Intact Other: Mastoid air cells and middle ear cavities are clear. CT MAXILLOFACIAL FINDINGS Osseous: No fracture or mandibular dislocation. No destructive process. Orbits: Negative. No traumatic or inflammatory finding. Sinuses: Bilateral ethmoidectomy, middle turbinectomy, and maxillary antrectomy has been performed. Extensive opacification and mucosal thickening within the residual paranasal sinuses. Soft tissues: Negative. CT CERVICAL SPINE FINDINGS Alignment: Normal. Skull base and vertebrae: Craniocervical alignment is normal. The atlantodental interval is not widened. No acute fracture of the cervical spine. Vertebral body height is preserved. Soft tissues and spinal canal: No prevertebral fluid or swelling. No visible canal hematoma. Disc levels: C4-T1 intervertebral disc space narrowing and endplate remodeling is present keeping with changes moderate degenerative disc disease. Prevertebral soft tissues are not thickened on sagittal reformats. No high-grade canal stenosis. Multilevel uncovertebral and facet arthrosis results in multilevel neuroforaminal narrowing, severe on the right at C3-4 and the left at C4-5 Upper chest: Negative. Other: None IMPRESSION: 1.  No acute intracranial abnormality. 2. No acute facial bone fracture. 3. Extensive paranasal sinus disease. 4. No acute fracture of the cervical spine. 5. Multilevel degenerative disc disease and facet arthrosis resulting in multilevel neuroforaminal narrowing, severe on the right at C3-4 and the left at C4-5. Electronically Signed   By: Dorethia Molt M.D.   On: 01/27/2024 00:29   CT HEAD WO CONTRAST ( ) Result Date: 01/27/2024 CLINICAL DATA:  Head trauma, minor (Age >= 65y); Facial trauma, blunt; Neck trauma (Age >= 65y). Fall EXAM: CT HEAD WITHOUT CONTRAST CT MAXILLOFACIAL WITHOUT CONTRAST CT CERVICAL SPINE WITHOUT CONTRAST TECHNIQUE: Multidetector CT imaging of the head, cervical spine, and maxillofacial structures were performed using the standard protocol without intravenous contrast. Multiplanar CT image reconstructions of the cervical spine and maxillofacial structures were also generated. RADIATION DOSE REDUCTION: This exam was performed  according to the departmental dose-optimization program which includes automated exposure control, adjustment of the mA and/or kV according to patient size and/or use of iterative reconstruction technique. COMPARISON:  None Available. FINDINGS: CT HEAD FINDINGS Brain: Normal anatomic configuration. Mild periventricular white matter changes are present likely reflecting the sequela of small vessel ischemia. No abnormal intra or extra-axial mass lesion or fluid collection. No abnormal mass effect or midline shift. No evidence of acute intracranial hemorrhage or infarct. Ventricular size is normal. Cerebellum unremarkable. Vascular: No asymmetric hyperdense vasculature at the skull base. Skull: Intact Other: Mastoid air cells and middle ear cavities are clear. CT MAXILLOFACIAL FINDINGS Osseous: No fracture or mandibular dislocation. No destructive process. Orbits: Negative. No traumatic or inflammatory finding. Sinuses: Bilateral ethmoidectomy, middle turbinectomy, and  maxillary antrectomy has been performed. Extensive opacification and mucosal thickening within the residual paranasal sinuses. Soft tissues: Negative. CT CERVICAL SPINE FINDINGS Alignment: Normal. Skull base and vertebrae: Craniocervical alignment is normal. The atlantodental interval is not widened. No acute fracture of the cervical spine. Vertebral body height is preserved. Soft tissues and spinal canal: No prevertebral fluid or swelling. No visible canal hematoma. Disc levels: C4-T1 intervertebral disc space narrowing and endplate remodeling is present keeping with changes moderate degenerative disc disease. Prevertebral soft tissues are not thickened on sagittal reformats. No high-grade canal stenosis. Multilevel uncovertebral and facet arthrosis results in multilevel neuroforaminal narrowing, severe on the right at C3-4 and the left at C4-5 Upper chest: Negative. Other: None IMPRESSION: 1. No acute intracranial abnormality. 2. No acute facial bone fracture. 3. Extensive paranasal sinus disease. 4. No acute fracture of the cervical spine. 5. Multilevel degenerative disc disease and facet arthrosis resulting in multilevel neuroforaminal narrowing, severe on the right at C3-4 and the left at C4-5. Electronically Signed   By: Dorethia Molt M.D.   On: 01/27/2024 00:29   CT Cervical Spine Wo Contrast Result Date: 01/27/2024 CLINICAL DATA:  Head trauma, minor (Age >= 65y); Facial trauma, blunt; Neck trauma (Age >= 65y). Fall EXAM: CT HEAD WITHOUT CONTRAST CT MAXILLOFACIAL WITHOUT CONTRAST CT CERVICAL SPINE WITHOUT CONTRAST TECHNIQUE: Multidetector CT imaging of the head, cervical spine, and maxillofacial structures were performed using the standard protocol without intravenous contrast. Multiplanar CT image reconstructions of the cervical spine and maxillofacial structures were also generated. RADIATION DOSE REDUCTION: This exam was performed according to the departmental dose-optimization program which includes  automated exposure control, adjustment of the mA and/or kV according to patient size and/or use of iterative reconstruction technique. COMPARISON:  None Available. FINDINGS: CT HEAD FINDINGS Brain: Normal anatomic configuration. Mild periventricular white matter changes are present likely reflecting the sequela of small vessel ischemia. No abnormal intra or extra-axial mass lesion or fluid collection. No abnormal mass effect or midline shift. No evidence of acute intracranial hemorrhage or infarct. Ventricular size is normal. Cerebellum unremarkable. Vascular: No asymmetric hyperdense vasculature at the skull base. Skull: Intact Other: Mastoid air cells and middle ear cavities are clear. CT MAXILLOFACIAL FINDINGS Osseous: No fracture or mandibular dislocation. No destructive process. Orbits: Negative. No traumatic or inflammatory finding. Sinuses: Bilateral ethmoidectomy, middle turbinectomy, and maxillary antrectomy has been performed. Extensive opacification and mucosal thickening within the residual paranasal sinuses. Soft tissues: Negative. CT CERVICAL SPINE FINDINGS Alignment: Normal. Skull base and vertebrae: Craniocervical alignment is normal. The atlantodental interval is not widened. No acute fracture of the cervical spine. Vertebral body height is preserved. Soft tissues and spinal canal: No prevertebral fluid or swelling. No visible canal hematoma. Disc levels: C4-T1  intervertebral disc space narrowing and endplate remodeling is present keeping with changes moderate degenerative disc disease. Prevertebral soft tissues are not thickened on sagittal reformats. No high-grade canal stenosis. Multilevel uncovertebral and facet arthrosis results in multilevel neuroforaminal narrowing, severe on the right at C3-4 and the left at C4-5 Upper chest: Negative. Other: None IMPRESSION: 1. No acute intracranial abnormality. 2. No acute facial bone fracture. 3. Extensive paranasal sinus disease. 4. No acute fracture of  the cervical spine. 5. Multilevel degenerative disc disease and facet arthrosis resulting in multilevel neuroforaminal narrowing, severe on the right at C3-4 and the left at C4-5. Electronically Signed   By: Dorethia Molt M.D.   On: 01/27/2024 00:29   DG Knee Complete 4 Views Right Result Date: 01/27/2024 CLINICAL DATA:  Fall, right knee pain EXAM: RIGHT KNEE - COMPLETE 4+ VIEW COMPARISON:  None Available. FINDINGS: Normal alignment. No acute fracture or dislocation. Mild medial and lateral compartment degenerative arthritis with joint space narrowing. Small right knee effusion. Vascular calcifications noted. IMPRESSION: 1. Small right knee effusion. No acute fracture or dislocation. Electronically Signed   By: Dorethia Molt M.D.   On: 01/27/2024 00:21     PROCEDURES:  Critical Care performed: No   CRITICAL CARE Performed by: Josette Oumou Smead   Total critical care time: 0 minutes  Critical care time was exclusive of separately billable procedures and treating other patients.  Critical care was necessary to treat or prevent imminent or life-threatening deterioration.  Critical care was time spent personally by me on the following activities: development of treatment plan with patient and/or surrogate as well as nursing, discussions with consultants, evaluation of patient's response to treatment, examination of patient, obtaining history from patient or surrogate, ordering and performing treatments and interventions, ordering and review of laboratory studies, ordering and review of radiographic studies, pulse oximetry and re-evaluation of patient's condition.   Procedures    IMPRESSION / MDM / ASSESSMENT AND PLAN / ED COURSE  I reviewed the triage vital signs and the nursing notes.  Patient here with mechanical fall.  Complaining of right leg pain.  Did hit her head/face.    DIFFERENTIAL DIAGNOSIS (includes but not limited to):   Contusion, extremity fracture, skull fracture,  intracranial hemorrhage, cervical spine fracture, facial fracture  Patient's presentation is most consistent with acute complicated illness / injury requiring diagnostic workup.  PLAN: CTs, x-rays obtained from triage and reviewed/interpreted by myself and the radiologist and show no acute abnormality.  Will give pain medication and attempt ambulation.   MEDICATIONS GIVEN IN ED: Medications  HYDROcodone -acetaminophen  (NORCO/VICODIN) 5-325 MG per tablet 2 tablet (2 tablets Oral Given 01/27/24 0114)  ondansetron  (ZOFRAN -ODT) disintegrating tablet 4 mg (4 mg Oral Given 01/27/24 0114)     ED COURSE: Patient able to ambulate with assistance.  Will discharge with walker, pain medication.  Patient and family comfortable with this plan.  Recommended rest, elevation, ice.  At this time, I do not feel there is any life-threatening condition present. I reviewed all nursing notes, vitals, pertinent previous records.  All lab and urine results, EKGs, imaging ordered have been independently reviewed and interpreted by myself.  I reviewed all available radiology reports from any imaging ordered this visit.  Based on my assessment, I feel the patient is safe to be discharged home without further emergent workup and can continue workup as an outpatient as needed. Discussed all findings, treatment plan as well as usual and customary return precautions.  They verbalize understanding and are comfortable with  this plan.  Outpatient follow-up has been provided as needed.  All questions have been answered.    CONSULTS:  none   OUTSIDE RECORDS REVIEWED: Reviewed most recent family medicine notes.       FINAL CLINICAL IMPRESSION(S) / ED DIAGNOSES   Final diagnoses:  Fall, initial encounter  Contusion of right knee and lower leg, initial encounter  Injury of head, initial encounter     Rx / DC Orders   ED Discharge Orders          Ordered    HYDROcodone -acetaminophen  (NORCO/VICODIN) 5-325 MG tablet   Every 8 hours PRN        01/27/24 0221    ondansetron  (ZOFRAN -ODT) 4 MG disintegrating tablet  Every 6 hours PRN        01/27/24 0221    ibuprofen  (ADVIL ) 800 MG tablet  Every 8 hours PRN        01/27/24 0221             Note:  This document was prepared using Dragon voice recognition software and may include unintentional dictation errors.   Kymia Simi, Josette SAILOR, DO 01/27/24 1006

## 2024-03-25 ENCOUNTER — Ambulatory Visit (INDEPENDENT_AMBULATORY_CARE_PROVIDER_SITE_OTHER): Payer: Self-pay

## 2024-03-25 DIAGNOSIS — Z23 Encounter for immunization: Secondary | ICD-10-CM

## 2024-04-04 ENCOUNTER — Encounter (HOSPITAL_COMMUNITY): Payer: Self-pay | Admitting: *Deleted

## 2024-04-04 ENCOUNTER — Emergency Department (HOSPITAL_COMMUNITY)

## 2024-04-04 ENCOUNTER — Other Ambulatory Visit: Payer: Self-pay

## 2024-04-04 ENCOUNTER — Observation Stay (HOSPITAL_COMMUNITY)
Admission: EM | Admit: 2024-04-04 | Discharge: 2024-04-05 | Disposition: A | Discharge status: Home / Self Care | Attending: Family Medicine | Admitting: Family Medicine

## 2024-04-04 DIAGNOSIS — I10 Essential (primary) hypertension: Secondary | ICD-10-CM | POA: Diagnosis not present

## 2024-04-04 DIAGNOSIS — E785 Hyperlipidemia, unspecified: Secondary | ICD-10-CM | POA: Diagnosis not present

## 2024-04-04 DIAGNOSIS — I6389 Other cerebral infarction: Secondary | ICD-10-CM | POA: Diagnosis not present

## 2024-04-04 DIAGNOSIS — Z87891 Personal history of nicotine dependence: Secondary | ICD-10-CM | POA: Diagnosis not present

## 2024-04-04 DIAGNOSIS — J309 Allergic rhinitis, unspecified: Secondary | ICD-10-CM | POA: Diagnosis present

## 2024-04-04 DIAGNOSIS — Z79899 Other long term (current) drug therapy: Secondary | ICD-10-CM | POA: Diagnosis not present

## 2024-04-04 DIAGNOSIS — E876 Hypokalemia: Secondary | ICD-10-CM | POA: Diagnosis not present

## 2024-04-04 DIAGNOSIS — E782 Mixed hyperlipidemia: Secondary | ICD-10-CM

## 2024-04-04 DIAGNOSIS — Z7902 Long term (current) use of antithrombotics/antiplatelets: Secondary | ICD-10-CM | POA: Diagnosis not present

## 2024-04-04 DIAGNOSIS — Z6832 Body mass index (BMI) 32.0-32.9, adult: Secondary | ICD-10-CM | POA: Insufficient documentation

## 2024-04-04 DIAGNOSIS — I700 Atherosclerosis of aorta: Secondary | ICD-10-CM | POA: Insufficient documentation

## 2024-04-04 DIAGNOSIS — E669 Obesity, unspecified: Secondary | ICD-10-CM | POA: Diagnosis not present

## 2024-04-04 DIAGNOSIS — Z7982 Long term (current) use of aspirin: Secondary | ICD-10-CM | POA: Diagnosis not present

## 2024-04-04 DIAGNOSIS — R531 Weakness: Secondary | ICD-10-CM | POA: Insufficient documentation

## 2024-04-04 DIAGNOSIS — I639 Cerebral infarction, unspecified: Principal | ICD-10-CM | POA: Diagnosis present

## 2024-04-04 DIAGNOSIS — R202 Paresthesia of skin: Secondary | ICD-10-CM | POA: Diagnosis present

## 2024-04-04 DIAGNOSIS — R29701 NIHSS score 1: Secondary | ICD-10-CM | POA: Diagnosis not present

## 2024-04-04 DIAGNOSIS — R200 Anesthesia of skin: Secondary | ICD-10-CM

## 2024-04-04 DIAGNOSIS — R29898 Other symptoms and signs involving the musculoskeletal system: Secondary | ICD-10-CM

## 2024-04-04 LAB — COMPREHENSIVE METABOLIC PANEL WITH GFR
ALT: 16 U/L (ref 0–44)
AST: 24 U/L (ref 15–41)
Albumin: 3.9 g/dL (ref 3.5–5.0)
Alkaline Phosphatase: 84 U/L (ref 38–126)
Anion gap: 10 (ref 5–15)
BUN: 11 mg/dL (ref 8–23)
CO2: 23 mmol/L (ref 22–32)
Calcium: 9.3 mg/dL (ref 8.9–10.3)
Chloride: 106 mmol/L (ref 98–111)
Creatinine, Ser: 0.71 mg/dL (ref 0.44–1.00)
GFR, Estimated: >60 mL/min (ref >60)
Glucose, Bld: 134 mg/dL — ABNORMAL HIGH (ref 70–99)
Potassium: 3.4 mmol/L — ABNORMAL LOW (ref 3.5–5.1)
Sodium: 139 mmol/L (ref 135–145)
Total Bilirubin: 0.8 mg/dL (ref 0.0–1.2)
Total Protein: 7.1 g/dL (ref 6.5–8.1)

## 2024-04-04 LAB — RAPID URINE DRUG SCREEN, HOSP PERFORMED
Amphetamines: NOT DETECTED
Barbiturates: NOT DETECTED
Benzodiazepines: NOT DETECTED
Cocaine: NOT DETECTED
Opiates: NOT DETECTED
Tetrahydrocannabinol: NOT DETECTED

## 2024-04-04 LAB — DIFFERENTIAL
Abs Immature Granulocytes: 0.03 K/uL (ref 0.00–0.07)
Basophils Absolute: 0 K/uL (ref 0.0–0.1)
Basophils Relative: 0 %
Eosinophils Absolute: 0.2 K/uL (ref 0.0–0.5)
Eosinophils Relative: 1 %
Immature Granulocytes: 0 %
Lymphocytes Relative: 34 %
Lymphs Abs: 3.5 K/uL (ref 0.7–4.0)
Monocytes Absolute: 0.7 K/uL (ref 0.1–1.0)
Monocytes Relative: 7 %
Neutro Abs: 6.1 K/uL (ref 1.7–7.7)
Neutrophils Relative %: 58 %

## 2024-04-04 LAB — I-STAT CHEM 8, ED
BUN: 11 mg/dL (ref 8–23)
Calcium, Ion: 1.2 mmol/L (ref 1.15–1.40)
Chloride: 107 mmol/L (ref 98–111)
Creatinine, Ser: 0.7 mg/dL (ref 0.44–1.00)
Glucose, Bld: 139 mg/dL — ABNORMAL HIGH (ref 70–99)
HCT: 44 % (ref 36.0–46.0)
Hemoglobin: 15 g/dL (ref 12.0–15.0)
Potassium: 3.5 mmol/L (ref 3.5–5.1)
Sodium: 143 mmol/L (ref 135–145)
TCO2: 23 mmol/L (ref 22–32)

## 2024-04-04 LAB — MAGNESIUM: Magnesium: 2.2 mg/dL (ref 1.7–2.4)

## 2024-04-04 LAB — CBC
HCT: 44.8 % (ref 36.0–46.0)
Hemoglobin: 14.2 g/dL (ref 12.0–15.0)
MCH: 28.2 pg (ref 26.0–34.0)
MCHC: 31.7 g/dL (ref 30.0–36.0)
MCV: 89.1 fL (ref 80.0–100.0)
Platelets: 278 K/uL (ref 150–400)
RBC: 5.03 MIL/uL (ref 3.87–5.11)
RDW: 12.8 % (ref 11.5–15.5)
WBC: 10.5 K/uL (ref 4.0–10.5)
nRBC: 0 % (ref 0.0–0.2)

## 2024-04-04 LAB — HEMOGLOBIN A1C
Hgb A1c MFr Bld: 5.4 % (ref 4.8–5.6)
Mean Plasma Glucose: 108.28 mg/dL

## 2024-04-04 LAB — ETHANOL: Alcohol, Ethyl (B): <15 mg/dL (ref <15)

## 2024-04-04 MED ORDER — ACETAMINOPHEN 650 MG RE SUPP: 650.0000 mg | Freq: Four times a day (QID) | RECTAL | Status: DC | PRN

## 2024-04-04 MED ORDER — MELATONIN 3 MG PO TABS: 3.0000 mg | ORAL_TABLET | Freq: Every evening | ORAL | Status: DC | PRN

## 2024-04-04 MED ORDER — IOHEXOL 350 MG/ML SOLN
75.0000 mL | Freq: Once | INTRAVENOUS | Status: AC | PRN
Start: 2024-04-04 — End: 2024-04-04
  Administered 2024-04-04: 75 mL via INTRAVENOUS

## 2024-04-04 MED ORDER — LORAZEPAM 1 MG PO TABS
1.0000 mg | ORAL_TABLET | ORAL | Status: AC
  Administered 2024-04-04: 1 mg via ORAL
  Filled 2024-04-04: qty 1

## 2024-04-04 MED ORDER — CLOPIDOGREL BISULFATE 300 MG PO TABS
300.0000 mg | ORAL_TABLET | Freq: Once | ORAL | Status: AC
  Administered 2024-04-04: 300 mg via ORAL
  Filled 2024-04-04: qty 1

## 2024-04-04 MED ORDER — ASPIRIN 81 MG PO CHEW
324.0000 mg | CHEWABLE_TABLET | Freq: Once | ORAL | Status: AC
  Administered 2024-04-04: 324 mg via ORAL
  Filled 2024-04-04: qty 4

## 2024-04-04 MED ORDER — POTASSIUM CHLORIDE CRYS ER 20 MEQ PO TBCR
40.0000 meq | EXTENDED_RELEASE_TABLET | Freq: Once | ORAL | Status: AC
  Administered 2024-04-04: 40 meq via ORAL
  Filled 2024-04-04: qty 2

## 2024-04-04 MED ORDER — LACTATED RINGERS IV BOLUS
1000.0000 mL | Freq: Once | INTRAVENOUS | Status: AC
  Administered 2024-04-04: 1000 mL via INTRAVENOUS

## 2024-04-04 MED ORDER — HYDRALAZINE HCL 20 MG/ML IJ SOLN: 10.0000 mg | INTRAMUSCULAR | Status: DC | PRN

## 2024-04-04 MED ORDER — ACETAMINOPHEN 325 MG PO TABS
650.0000 mg | ORAL_TABLET | Freq: Four times a day (QID) | ORAL | Status: DC | PRN
  Administered 2024-04-05: 650 mg via ORAL
  Filled 2024-04-04: qty 2

## 2024-04-04 MED ORDER — ONDANSETRON HCL 4 MG/2ML IJ SOLN: 4.0000 mg | Freq: Four times a day (QID) | INTRAMUSCULAR | Status: DC | PRN

## 2024-04-04 MED ORDER — STROKE: EARLY STAGES OF RECOVERY BOOK
Freq: Once | Status: AC
  Filled 2024-04-04: qty 1

## 2024-04-04 NOTE — ED Provider Notes (Signed)
 [facility] AT [facility] Provider Note   CSN: [id number] Arrival date & time: 04/04/24  1609     Patient presents with: Numbness   Barbara Kaiser is a 68 y.o. female.   68 year old female with a history of hypertension who presents to the emergency department with right hand numbness and weakness.  At 3 PM was at church and started feeling poorly.  Says that both of her hands started feeling numb.  Says that after that she noticed that her right hand felt numb and weak.  Says she is having a difficult time gripping things.  No headache.  No neck pain.  No pain in her arm.  Not on blood thinners and no history of stroke.       Prior to Admission medications   Medication Sig Start Date End Date Taking? Authorizing Provider  docusate sodium  (COLACE) 100 MG capsule Take 1 capsule (100 mg total) by mouth 2 (two) times daily. Patient taking differently: Take 100 mg by mouth 2 (two) times daily as needed for moderate constipation. 02/22/22  Yes Mavis Purchase, MD  ezetimibe (ZETIA) 10 MG tablet Take 1 tablet by mouth daily. 11/11/23 11/10/24 Yes [provider]  fluticasone  (FLONASE ) 50 MCG/ACT nasal spray Place 2 sprays into both nostrils daily. Patient taking differently: Place 2 sprays into both nostrils daily as needed for allergies. 07/10/22  Yes Scarboro, Juliene PARAS, NP  loratadine  (CLARITIN ) 10 MG tablet Take 1 tablet (10 mg total) by mouth daily. 08/30/22  Yes Scarboro, Adam J, NP  methocarbamol (ROBAXIN) 500 MG tablet Take 500 mg by mouth as needed for muscle spasms.   Yes [provider]  metoprolol  tartrate (LOPRESSOR ) 25 MG tablet Take 1 tablet (25 mg total) by mouth 2 (two) times daily. 06/13/22  Yes Simmons-Robinson, Makiera, MD  amLODipine  (NORVASC ) 5 MG tablet Take 1 tablet (5 mg total) by mouth daily. Patient not taking: Reported on 04/04/2024 06/13/22   Simmons-Robinson, Rockie, MD  HYDROcodone -acetaminophen  (NORCO/VICODIN) 5-325 MG  tablet Take 2 tablets by mouth every 8 (eight) hours as needed. Patient not taking: Reported on 04/04/2024 01/27/24   Ward, Josette SAILOR, DO  ibuprofen  (ADVIL ) 800 MG tablet Take 1 tablet (800 mg total) by mouth every 8 (eight) hours as needed. Patient not taking: Reported on 04/04/2024 01/27/24   Ward, Josette SAILOR, DO  montelukast  (SINGULAIR ) 10 MG tablet Take 1 tablet (10 mg total) by mouth at bedtime. Patient not taking: Reported on 04/04/2024 12/30/22   Gandhi, Safal, PA-C  ondansetron  (ZOFRAN -ODT) 4 MG disintegrating tablet Take 1 tablet (4 mg total) by mouth every 6 (six) hours as needed for nausea or vomiting. Patient not taking: Reported on 04/04/2024 01/27/24   Ward, Josette SAILOR, DO    Allergies: Patient has no known allergies.    Review of Systems  Updated Vital Signs BP (!) 158/81 (BP Location: Right Arm)   Pulse 74   Temp 98.5 F (36.9 C) (Oral)   Resp 20   Ht 5' 7 (1.702 m)   Wt 93.4 kg   LMP 11/28/2011   SpO2 98%   BMI 32.25 kg/m   Physical Exam Vitals and nursing note reviewed.  Constitutional:      General: She is not in acute distress.    Appearance: She is well-developed.  HENT:     Head: Normocephalic and atraumatic.     Right Ear: External ear normal.     Left Ear: External ear normal.     Nose:  Nose normal.  Eyes:     Extraocular Movements: Extraocular movements intact.     Conjunctiva/sclera: Conjunctivae normal.     Pupils: Pupils are equal, round, and reactive to light.  Cardiovascular:     Rate and Rhythm: Normal rate and regular rhythm.     Heart sounds: No murmur heard. Pulmonary:     Effort: Pulmonary effort is normal. No respiratory distress.     Breath sounds: Normal breath sounds.  Musculoskeletal:     Cervical back: Normal range of motion and neck supple.     Right lower leg: No edema.     Left lower leg: No edema.     Comments: Radial pulses 2+ bilaterally.    Skin:    General: Skin is warm and dry.  Neurological:     Mental Status: She is  alert and oriented to person, place, and time. Mental status is at baseline.     Comments: Diminished sensation to light touch of the thumb and index finger of the right hand.  Diminished ability to make an okay sign with the right hand due to weakness.  Decreased grip strength of the right hand.  NIHSS Exam  Level of Consciousness: Alert  LOC Questions: Answers Month and Age Correctly  LOC Commands: Opens and Closes Eyes and Hands on command  Best Gaze: Horizontal ocular movements intact  Visual Fields: No visual field loss  Facial Palsy: None  L Upper Extremity Motor: No drift after 10 seconds  R Upper Extremity Motor: No drift after 10 seconds  L Lower extremity Motor: No drift after 5 seconds  R Lower extremity Motor: No drift after 5 seconds  Ataxia: Absent  Sensory: Intact sensation to light touch on face, arms, trunk, and legs bilaterally aside from deficits noted above Best Language: No aphasia  Dysarthria: No dysarthria  Neglect: No visual or sensory neglect    Psychiatric:        Mood and Affect: Mood normal.     (all labs ordered are listed, but only abnormal results are displayed) Labs Reviewed  COMPREHENSIVE METABOLIC PANEL WITH GFR - Abnormal; Notable for the following components:      Result Value   Potassium 3.4 (*)    Glucose, Bld 134 (*)    All other components within normal limits  I-STAT CHEM 8, ED - Abnormal; Notable for the following components:   Glucose, Bld 139 (*)    All other components within normal limits  ETHANOL  CBC  DIFFERENTIAL  RAPID URINE DRUG SCREEN, HOSP PERFORMED  MAGNESIUM  HEMOGLOBIN A1C  CBC WITH DIFFERENTIAL/PLATELET  COMPREHENSIVE METABOLIC PANEL WITH GFR  MAGNESIUM  LIPID PANEL    EKG: EKG Interpretation Date/Time:  Sunday April 04 2024 16:39:50 EDT Ventricular Rate:  73 PR Interval:  138 QRS Duration:  102 QT Interval:  392 QTC Calculation: 431 R Axis:   -23  Text Interpretation: Normal sinus rhythm Left  ventricular hypertrophy with repolarization abnormality ( R in aVL , Cornell product , Romhilt-Estes ) Abnormal ECG When compared with ECG of 12-Feb-2022 15:07, PREVIOUS ECG IS PRESENT Confirmed by Yolande Charleston [id number]) on 04/04/2024 4:57:30 PM  Radiology: MR BRAIN WO CONTRAST Result Date: 04/04/2024 EXAM: MRI BRAIN WITHOUT CONTRAST 04/04/2024 07:07:43 PM TECHNIQUE: Multiplanar multisequence MRI of the head/brain was performed without the administration of intravenous contrast. COMPARISON: None available. CLINICAL HISTORY: Right hand weakness and numbness. FINDINGS: BRAIN AND VENTRICLES: Multifocal acute ischemia within the left MCA territory predominantly affecting the precentral and postcentral gyri.  Multifocal hyperintense T2-weighted signal within the cerebral white matter, most commonly due to chronic small vessel disease. Mild volume loss. No intracranial hemorrhage. No mass. No midline shift. No hydrocephalus. The sella is unremarkable. Normal flow voids. ORBITS: No acute abnormality. SINUSES AND MASTOIDS: Chronic pansinusitis. BONES AND SOFT TISSUES: Normal marrow signal. No acute soft tissue abnormality. IMPRESSION: 1. Multifocal acute ischemia within the left MCA territory predominantly affecting the precentral and postcentral gyri. 2. Multifocal hyperintense T2-weighted signal within the cerebral white matter, most commonly due to chronic small vessel disease. 3. Mild volume loss. 4. Chronic pansinusitis. Electronically signed by: Franky Stanford MD 04/04/2024 07:52 PM EDT RP Workstation: [id number]   CT ANGIO HEAD NECK W WO CM Result Date: 04/04/2024 CLINICAL DATA:  Provided history: Neuro deficit, acute, stroke suspected. Right hand weakness and numbness. EXAM: CT ANGIOGRAPHY HEAD AND NECK WITH AND WITHOUT CONTRAST TECHNIQUE: Multidetector CT imaging of the head and neck was performed using the standard protocol during bolus administration of intravenous contrast. Multiplanar CT image  reconstructions and MIPs were obtained to evaluate the vascular anatomy. Carotid stenosis measurements (when applicable) are obtained utilizing NASCET criteria, using the distal internal carotid diameter as the denominator. RADIATION DOSE REDUCTION: This exam was performed according to the departmental dose-optimization program which includes automated exposure control, adjustment of the mA and/or kV according to patient size and/or use of iterative reconstruction technique. CONTRAST:  75mL OMNIPAQUE  IOHEXOL  350 MG/ML SOLN COMPARISON:  Same-day brain MRI 04/04/2024. Head CT 01/27/2024. CT angiogram head 07/21/2014. FINDINGS: CT HEAD FINDINGS Brain: No age-advanced or lobar predominant cerebral atrophy. Patchy acute (predominantly cortical) MCA territory infarcts within the left frontal and parietal lobes, better appreciated on the same-day brain MRI. Notable involvement of the pre and postcentral gyri. Small chronic cortical infarct within the left parietal lobe. Chronic lacunar infarct within the left corona radiata. Moderate patchy and ill-defined hypoattenuation elsewhere within the cerebral white matter, nonspecific but compatible with chronic small vessel ischemic disease. There is no acute intracranial hemorrhage. No extra-axial fluid collection. No evidence of an intracranial mass. No midline shift. Vascular: No hyperdense vessel.  Atherosclerotic calcifications. Skull: No calvarial fracture or aggressive osseous lesion. Sinuses/Orbits: No orbital mass or acute orbital finding. Postsurgical appearance of the paranasal sinuses. Near complete opacification of the right frontal sinus. Extensive opacification of the left frontal sinus. Mild bilateral ethmoid sinus disease. Severe right sphenoid sinusitis. Severe left sphenoid sinusitis (with complete sinus opacification). Severe right maxillary sinusitis. Mild mucosal thickening within the left maxillary sinus. Review of the MIP images confirms the above findings  CTA NECK FINDINGS Aortic arch: Common origin of the innominate and left common carotid arteries. Atherosclerotic plaque within the aortic arch and proximal major branch vessels of the neck. Streak/beam hardening artifact arising from a dense contrast bolus partially obscures the left subclavian artery. Within this limitation, there is no appreciable hemodynamically significant innominate or proximal subclavian artery stenosis. Right carotid system: CCA and ICA patent within the neck without measurable stenosis. Atherosclerotic plaque within the mid and distal common carotid artery, about the carotid bifurcation and within the proximal ICA. Partially retropharyngeal course of the cervical ICA. Left carotid system: CCA and ICA patent within the neck without measurable stenosis. Atherosclerotic plaque about the carotid bifurcation and within the proximal ICA. Vertebral arteries: Codominant patent within the neck. Severe atherosclerotic narrowing at the right vertebral artery origin. Skeleton: Nonspecific reversal of the expected cervical lordosis. Mild grade 1 anterolisthesis at C4-C5 and C7-T1. Cervical spondylosis. Other neck: No neck mass  or cervical lymphadenopathy. Upper chest: No consolidation within the imaged lung apices. Review of the MIP images confirms the above findings CTA HEAD FINDINGS Anterior circulation: The intracranial internal carotid arteries are patent. Atherosclerotic plaque within both vessels with no more than mild stenosis. The M1 middle cerebral arteries are patent. Atherosclerotic irregularity of the M2 and more distal MCA branches bilaterally. Most notably, there is a severe stenosis within a mid-to-distal left MCA branch (series 18, image 30). No M2 proximal branch occlusion is identified. The anterior cerebral arteries are patent. No intracranial aneurysm is identified. Posterior circulation: The intracranial vertebral arteries are patent. The basilar artery is patent. The posterior  cerebral arteries are patent. Atherosclerotic irregularity of both vessels most notably as follows. Moderate-to-severe stenosis within the right posterior cerebral artery at the P2/P3 junction (series 16, image 19). Severe stenosis within the left posterior cerebral artery at the P4 segment level (series 16, image 19). Hypoplastic right P1 segment with sizable right posterior communicating artery. The left posterior communicating artery is diminutive or absent. Venous sinuses: Within the limitations of contrast timing, no convincing thrombus. Anatomic variants: As described Review of the MIP images confirms the above findings IMPRESSION: Non-contrast head CT: 1. Patchy acute (predominantly cortical) middle cerebral artery territory infarcts within the left frontal and parietal lobes, better delineated on the same-day brain MRI. Notable involvement of the pre and postcentral gyri. 2. Background chronic small vessel ischemic disease and chronic infarcts, as described. 3. Extensive paranasal sinus disease as outlined. CTA neck: 1. The common carotid and internal carotid arteries are patent in the neck without stenosis. Atherosclerotic plaque bilaterally, as described. 2. Vertebral arteries patent within the neck. Severe atherosclerotic narrowing of the right vertebral artery origin. 3. Aortic Atherosclerosis (ICD10-I70.0). CTA head: 1. No proximal intracranial large vessel occlusion is identified. 2. Intracranial atherosclerotic disease with multifocal stenoses, most notably as follows. 3. Severe stenosis within a mid-to-distal left middle cerebral artery branch. 4. Moderate-to-severe stenosis within the right posterior cerebral artery at the P2/P3 junction. 5. Severe stenosis within the left posterior cerebral artery at the P4 segment level. Electronically Signed   By: Rockey Childs D.O.   On: 04/04/2024 19:47     Procedures   Medications Ordered in the ED  acetaminophen  (TYLENOL ) tablet 650 mg (has no  administration in time range)    Or  acetaminophen  (TYLENOL ) suppository 650 mg (has no administration in time range)  melatonin tablet 3 mg (has no administration in time range)  ondansetron  (ZOFRAN ) injection 4 mg (has no administration in time range)  hydrALAZINE (APRESOLINE) injection 10 mg (has no administration in time range)  LORazepam (ATIVAN) tablet 1 mg (1 mg Oral Given 04/04/24 1750)  iohexol  (OMNIPAQUE ) 350 MG/ML injection 75 mL (75 mLs Intravenous Contrast Given 04/04/24 1839)  lactated ringers  bolus 1,000 mL (1,000 mLs Intravenous New Bag/Given 04/04/24 2033)  aspirin chewable tablet 324 mg (324 mg Oral Given 04/04/24 2116)  clopidogrel (PLAVIX) tablet 300 mg (300 mg Oral Given 04/04/24 2116)   stroke: early stages of recovery book ( Does not apply Given 04/04/24 2125)  potassium chloride SA (KLOR-CON M) CR tablet 40 mEq (40 mEq Oral Given 04/04/24 2124)    Clinical Course as of 04/04/24 2133  Sun Apr 04, 2024  1654 Discussed with Dr. Merrianne from neurology.  Does not feel that we should activate code stroke at this time given the patient's mild symptoms with only hand weakness and numbness of her dominant hand.  Feels that TNK is not  warranted but still agrees with imaging to ensure that the patient does not have a stroke. [RP]  2019 Repeated neurologic exam.  Patient subjectively states that she feels better. Grip strength is improved.  Numbness is improved.  No right upper extremity drift.  No aphasia or neglect.  No visual field cut. [RP]  2042 Dr Sallyann consulted. Recommends giving aspirin and plavix and admitting to hospitalist.  [RP]  2056 Dr Doc from hospitalist consulted for admission. [RP]    Clinical Course User Index [RP] Yolande Lamar BROCKS, MD                                 Medical Decision Making Amount and/or Complexity of Data Reviewed Labs: ordered. Radiology: ordered.  Risk OTC drugs. Prescription drug management. Decision regarding  hospitalization.   68 year old female with a history of hypertension presents emergency department with right hand numbness and weakness  Initial Ddx:  Peripheral neuropathy, stroke, spinal cord compression, hypoglycemia, electrolyte abnormality, anxiety  MDM/Course:  Patient presents to the emergency department with bilateral hand numbness.  Says that now she is having predominantly right hand numbness and some mild weakness.  Does not have any numbness or weakness of the rest of the arm.  No other signs of a stroke.  Did reach out to neurology immediately after evaluating the patient in triage regarding activating a code stroke.  It was felt that with her current symptoms is not a TNK candidate so they did not want to activate code stroke at this point in time.  They did recommend MRI and vessel imaging.  This did show distal MCA stroke.  Patient was reevaluated and did not show any signs of LVO and her exam actually had improved.  Discussed with neurology who recommended aspirin and plavix with admission.   This patient presents to the ED for concern of complaints listed in HPI, this involves an extensive number of treatment options, and is a complaint that carries with it a high risk of complications and morbidity. Disposition including potential need for admission considered.   Dispo: Admit to Floor  Additional history obtained from daughter Records reviewed Outpatient Clinic Notes The following labs were independently interpreted: Chemistry and show no acute abnormality I independently reviewed the following imaging with scope of interpretation limited to determining acute life threatening conditions related to emergency care: CT Head and agree with the radiologist interpretation with the following exceptions: none I personally reviewed and interpreted cardiac monitoring: normal sinus rhythm  I personally reviewed and interpreted the pt's EKG: see above for interpretation  I have reviewed  the patients home medications and made adjustments as needed Consults: Hospitalist and Neurology Social Determinants of health:  Geriatric  Portions of this note were generated with Scientist, clinical (histocompatibility and immunogenetics). Dictation errors may occur despite best attempts at proofreading.     Final diagnoses:  Cerebrovascular accident (CVA), unspecified mechanism (HCC)  Numbness of right hand  Right hand weakness    ED Discharge Orders     None          Yolande Lamar BROCKS, MD 04/04/24 2134

## 2024-04-04 NOTE — Consult Note (Incomplete)
 Standing 3156pm for some time, not getting up from seated position  Smoking hx remote 17y ago 0.5 ppd since 68yo  HTN - amlo 10 PRN, metop 25 BID  Zetia  Montelukast  loratidine  PRN robaxin Rosuvastatin replace zetia  A1c Lipid panel  RH Started bil all of hand numb, l resolved immediately, R continued withsome weakness. Now numbn only of space between thumb and index. 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

## 2024-04-04 NOTE — Consult Note (Incomplete)
 NEUROLOGY CONSULT NOTE   Date of service: April 04, 2024 Patient Name: Barbara Kaiser MRN:  [id number] DOB:  12-24-1955 Chief Complaint: Hand numbness and weakness Requesting Provider: Marcene Eva NOVAK, DO  History of Present Illness  Hana Fulmore is a 68 y.o. female with hx of HTN, asthma who presents with hand weakness and numbness.  Patient was her normal self up until around 315pm today when she was at church. She had been standing for some time when she noticed that both of her hands felt numb, no tingling. The numbness came on suddenly and involved all of her hands and fingers. As soon as she noticed it, the left hand sensation returned, however her right hand continued to be numb. She also noted some weakness in her fingers. It was difficult to flex her thumb in particular.   Stroke risk factors include: hypertension, HLD, smoking history (0.5ppd since 68yo, up until 68yo (16.5 pack years) .  Currently reports the numbness in her right hand has improved and now only involves the thumb and index finger and the space in between them. She still feels numbness, also improved, but still difficult to flex her thumb.   She is right handed.   She works as a Arboriculturist.  Other than the above, denies any other neurologic issues such as changes in vision, facial sensation or strength or symmetry, changes in voice or difficulty swallowing, numbness or weakness of the legs, issues with balance or language.   LKW: 04/04/24 @ 314pm Pre-Modified rankin score: 0 IV Thrombolysis: No (out of window) EVT: No (too mild to treat, no LVO) ICH Score: N/A  NIHSS components Score: Comment  1a Level of Conscious 0[x]  1[]  2[]  3[]      1b LOC Questions 0[x]  1[]  2[]       1c LOC Commands 0[x]  1[]  2[]       2 Best Gaze 0[x]  1[]  2[]       3 Visual 0[x]  1[]  2[]  3[]      4 Facial Palsy 0[x]  1[]  2[]  3[]      5a Motor Arm - left 0[x]  1[]  2[]  3[]  4[]  UN[]    5b Motor Arm - Right 0[x]  1[]  2[]  3[]  4[]  UN[]    6a  Motor Leg - Left 0[x]  1[]  2[]  3[]  4[]  UN[]    6b Motor Leg - Right 0[x]  1[]  2[]  3[]  4[]  UN[]    7 Limb Ataxia 0[x]  1[]  2[]  UN[]      8 Sensory 0[]  1[x]  2[]  UN[]      9 Best Language 0[x]  1[]  2[]  3[]      10 Dysarthria 0[x]  1[]  2[]  UN[]      11 Extinct. and Inattention 0[x]  1[]  2[]       TOTAL: 1      ROS  Comprehensive ROS performed and pertinent positives documented in HPI    Past History   Past Medical History:  Diagnosis Date  . Allergic rhinitis   . Anemia, iron deficiency   . Colon polyps 3/10  . Complication of anesthesia    hard to wake  . Hypertension   . PONV (postoperative nausea and vomiting)   . Uterine fibroid    with heavy menses and anemia (resolved by ablation)    Past Surgical History:  Procedure Laterality Date  . ABDOMINAL HYSTERECTOMY    . CARDIAC CATHETERIZATION  05/2005   mild CAD  . DILATION AND CURETTAGE OF UTERUS  12/08   hysteroscopy and endometrial ablation  . ETHMOIDECTOMY Bilateral 06/18/2019   Procedure: ETHMOIDECTOMY;  Surgeon: Karis Clunes, MD;  Location:  [facility];  Service: ENT;  Laterality: Bilateral;  . FRONTAL SINUS EXPLORATION Bilateral 06/18/2019   Procedure: FRONTAL SINUS EXPLORATION;  Surgeon: Karis Clunes, MD;  Location: [facility];  Service: ENT;  Laterality: Bilateral;  . LAPAROSCOPIC SUPRACERVICAL HYSTERECTOMY  12/09/2011   Procedure: LAPAROSCOPIC SUPRACERVICAL HYSTERECTOMY;  Surgeon: Gloris DELENA Hugger, MD;  Location: [facility];  Service: Gynecology;  Laterality: N/A;  . MAXILLARY ANTROSTOMY Bilateral 06/18/2019   Procedure: MAXILLARY ANTROSTOMY;  Surgeon: Karis Clunes, MD;  Location: [facility];  Service: ENT;  Laterality: Bilateral;  . NASAL SINUS SURGERY  11/07  . SALPINGOOPHORECTOMY  12/09/2011   Procedure: SALPINGO OOPHERECTOMY;  Surgeon: Gloris DELENA Hugger, MD;  Location: [facility];  Service: Gynecology;  Laterality: Bilateral;  . SINUS ENDO WITH FUSION Bilateral 06/18/2019   Procedure: SINUS ENDO WITH  FUSION;  Surgeon: Karis Clunes, MD;  Location: [facility];  Service: ENT;  Laterality: Bilateral;  . SPHENOIDECTOMY Bilateral 06/18/2019   Procedure: SPHENOIDECTOMY WITH TISSUE REMOVAL;  Surgeon: Karis Clunes, MD;  Location: [facility];  Service: ENT;  Laterality: Bilateral;  . SUPRACERVICAL ABDOMINAL HYSTERECTOMY  12/09/2011   Procedure: HYSTERECTOMY SUPRACERVICAL ABDOMINAL;  Surgeon: Gloris DELENA Hugger, MD;  Location: [facility];  Service: Gynecology;  Laterality: N/A;  . TUBAL LIGATION    . TURBINATE REDUCTION Bilateral 06/18/2019   Procedure: TURBINATE REDUCTION;  Surgeon: Karis Clunes, MD;  Location:  SUR[facility]ERY [facility];  Service: ENT;  Laterality: Bilateral;    Family History: Family History  Problem Relation Age of Onset  . Hypertension Mother   . Cancer Mother 20       uterine  . Hypertension Father   . Heart disease Other        MI  . Diabetes Other   . Stroke Other   . Heart disease Brother        age 63 and 37  . Cancer Other        lung  . Breast cancer Neg Hx     Social History  reports that she quit smoking about 22 years ago. Her smoking use included cigarettes. She started smoking about 46 years ago. She has a 6 pack-year smoking history. She has never used smokeless tobacco. She reports that she does not drink alcohol and does not use drugs.  No Known Allergies  Medications   Current Facility-Administered Medications:  .  acetaminophen  (TYLENOL ) tablet 650 mg, 650 mg, Oral, Q6H PRN **OR** acetaminophen  (TYLENOL ) suppository 650 mg, 650 mg, Rectal, Q6H PRN, Howerter, Justin B, DO .  hydrALAZINE (APRESOLINE) injection 10 mg, 10 mg, Intravenous, Q4H PRN, Howerter, Justin B, DO .  melatonin tablet 3 mg, 3 mg, Oral, QHS PRN, Howerter, Justin B, DO .  ondansetron  (ZOFRAN ) injection 4 mg, 4 mg, Intravenous, Q6H PRN, Howerter, Justin B, DO  Current Outpatient Medications:  .  docusate sodium  (COLACE) 100 MG capsule, Take 1 capsule (100 mg total) by  mouth 2 (two) times daily. (Patient taking differently: Take 100 mg by mouth 2 (two) times daily as needed for moderate constipation.), Disp: 30 capsule, Rfl: 0 .  ezetimibe (ZETIA) 10 MG tablet, Take 1 tablet by mouth daily., Disp: , Rfl:  .  fluticasone  (FLONASE ) 50 MCG/ACT nasal spray, Place 2 sprays into both nostrils daily. (Patient taking differently: Place 2 sprays into both nostrils daily as needed for allergies.), Disp: 16 g, Rfl: 6 .  loratadine  (CLARITIN ) 10 MG tablet, Take 1 tablet (10 mg total) by  mouth daily., Disp: 30 tablet, Rfl: 3 .  methocarbamol (ROBAXIN) 500 MG tablet, Take 500 mg by mouth as needed for muscle spasms., Disp: , Rfl:  .  metoprolol  tartrate (LOPRESSOR ) 25 MG tablet, Take 1 tablet (25 mg total) by mouth 2 (two) times daily., Disp: 180 tablet, Rfl: 0 .  amLODipine  (NORVASC ) 5 MG tablet, Take 1 tablet (5 mg total) by mouth daily. (Patient not taking: Reported on 04/20/24), Disp: 90 tablet, Rfl: 0 .  HYDROcodone -acetaminophen  (NORCO/VICODIN) 5-325 MG tablet, Take 2 tablets by mouth every 8 (eight) hours as needed. (Patient not taking: Reported on 04-20-24), Disp: 14 tablet, Rfl: 0 .  ibuprofen  (ADVIL ) 800 MG tablet, Take 1 tablet (800 mg total) by mouth every 8 (eight) hours as needed. (Patient not taking: Reported on 04-20-2024), Disp: 15 tablet, Rfl: 0 .  montelukast  (SINGULAIR ) 10 MG tablet, Take 1 tablet (10 mg total) by mouth at bedtime. (Patient not taking: Reported on 04/20/2024), Disp: 30 tablet, Rfl: 3 .  ondansetron  (ZOFRAN -ODT) 4 MG disintegrating tablet, Take 1 tablet (4 mg total) by mouth every 6 (six) hours as needed for nausea or vomiting. (Patient not taking: Reported on 04-20-24), Disp: 20 tablet, Rfl: 0  Vitals   Vitals:   04/20/24 1948 04/20/24 2115 20-Apr-2024 2155 04-20-24 2310  BP: (!) 158/81 (!) 180/86  (!) 162/77  Pulse: 72 74  71  Resp: 16 20  16   Temp:   98.5 F (36.9 C)   TempSrc:   Oral   SpO2: 100% 98%  95%  Weight:      Height:         Body mass index is 32.25 kg/m.   Physical Exam   Constitutional: Appears well-developed and well-nourished.  Psych: Affect appropriate to situation.  Eyes: No scleral injection.  HENT: No OP obstruction.  Head: Normocephalic.  Cardiovascular: Normal rate and regular rhythm.  Respiratory: Effort normal, non-labored breathing.  GI: Soft.  No distension.  Skin: WDI.   Neurologic Examination   Mental status: alert, oriented to person, place and time. Able to provide history. Speech: no dysarthria, word-finding difficulty, paraphasic errors. Cranial nerves: PERRL EOMI VF full Face sensation intact bilaterally. Face symmetric at rest and with activation. Hearing grossly intact. Palate elevation symmetric Tongue protrudes midline and has full range of motion. SCM's full strength bilaterally. Motor: Normal bulk and tone. No abnormal movements RUE: shoulder abduction 5/5, biceps 5/5, triceps 5/5, wrist flexion 5/5, wrist extension 5/5, hand grip 4/5, finger extension 4+/5, finger flexion (ulnar) 4+/5, (median) 4/5, finger spread 4/5 LUE: shoulder abduction 5/5, biceps 5/5, triceps 5/5, wrist flexion 5/5, wrist extension 5/5, hand grip 5/5 RLE: hip flexion 5/5, knee flexion 5/5, knee extension 5/5, ankle dorsiflexion 5/5, plantar flexion 5/5 (initially limited by pain) LLE: hip flexion 5/5, knee flexion 5/5, knee extension 5/5, ankle dorsiflexion 5/5, plantar flexion 5/5 Sensory: Grossly intact to light touch except for right thumb, index finger, and interdigital space. Reports feeling 90% when compared to rest of her hand. Reflexes: DTR's 2+. Equivocal R toe. Coordination: FTN intact. HTS intact. (Initially limited by pain) Gait: Deferred   Labs/Imaging/Neurodiagnostic studies   CBC:  Recent Labs  Lab April 20, 2024 1714 2024/04/20 1717  WBC  --  10.5  NEUTROABS  --  6.1  HGB 15.0 14.2  HCT 44.0 44.8  MCV  --  89.1  PLT  --  278   Basic Metabolic Panel:  Lab Results   Component Value Date   NA 139 Apr 20, 2024   K  3.4 (L) 04/04/2024   CO2 23 04/04/2024   GLUCOSE 134 (H) 04/04/2024   BUN 11 04/04/2024   CREATININE 0.71 04/04/2024   CALCIUM  9.3 04/04/2024   GFRNONAA >60 04/04/2024   GFRAA 99 08/18/2020   Lipid Panel:  Lab Results  Component Value Date   LDLCALC 147 (H) 08/21/2021   HgbA1c:  Lab Results  Component Value Date   HGBA1C 5.4 04/04/2024   Urine Drug Screen:     Component Value Date/Time   LABOPIA NONE DETECTED 04/04/2024 2000   COCAINSCRNUR NONE DETECTED 04/04/2024 2000   LABBENZ NONE DETECTED 04/04/2024 2000   AMPHETMU NONE DETECTED 04/04/2024 2000   THCU NONE DETECTED 04/04/2024 2000   LABBARB NONE DETECTED 04/04/2024 2000    Alcohol Level     Component Value Date/Time   [facility] <15 04/04/2024 1717    CT Head without contrast(Personally reviewed): Hypodensity of the left frontal and parietal lobe adjacent to central gyrus c/w acute/subacute infarct. Hypodensity of left posterior parietal c/w chronic infarct.   CT angio Head and Neck with contrast(Personally reviewed): Mild atherosclerotic stenosis of bilateral ICA. Severe of L MCA, distally. Moderate to severe of R P2/P3 Severe of L P4   MRI Brain(Personally reviewed): Small areas of DWI changes with ADC correlate in L MCA territory. Moderate amount of T2/FLAIR changes  Neurodiagnostics rEEG:  ***  ASSESSMENT   Jodiann Reardon is a 68 y.o. female ***  RECOMMENDATIONS  *** ______________________________________________________________________    Signed, Normie CHRISTELLA Blower, MD [facility]

## 2024-04-04 NOTE — ED Notes (Signed)
 Neurology at bedside.

## 2024-04-04 NOTE — H&P (Signed)
 History and Physical      Jareli Lizotte FMW:[id number] DOB: May 31, 1956 DOA: 04/04/2024; DOS: 04/04/2024  PCP: Bertrum Charlie CROME, MD *** Patient coming from: home ***  I have personally briefly reviewed patient's old medical records in [facility]  Chief Complaint: ***  HPI: Lizzy Hamre is a 68 y.o. female with medical history significant for *** who is admitted to [facility] on 04/04/2024 with *** after presenting from home*** to [facility] ED complaining of ***.    ***       ***   ED Course:  Vital signs in the ED were notable for the following: ***  Labs were notable for the following: ***  Per my interpretation, EKG in ED demonstrated the following:  ***  Imaging in the ED, per corresponding formal radiology read, was notable for the following:  ***  EDP d/w on-call neurology, Dr. Merrianne, followed by Dr. Sallyann. Neurology felt that TNK was not warranted given the mild and improving nature of pt's deficits. Neurology to formally consult, with Dr. Sallyann, recommending DAP therapy, with additional recs to follow.   While in the ED, the following were administered: ***  Subsequently, the patient was admitted  ***  ***red    Review of Systems: As per HPI otherwise 10 point review of systems negative.   Past Medical History:  Diagnosis Date   Allergic rhinitis    Anemia, iron deficiency    Colon polyps 3/10   Complication of anesthesia    hard to wake   Hypertension    PONV (postoperative nausea and vomiting)    Uterine fibroid    with heavy menses and anemia (resolved by ablation)    Past Surgical History:  Procedure Laterality Date   ABDOMINAL HYSTERECTOMY     CARDIAC CATHETERIZATION  05/2005   mild CAD   DILATION AND CURETTAGE OF UTERUS  12/08   hysteroscopy and endometrial ablation   ETHMOIDECTOMY Bilateral 06/18/2019   Procedure: ETHMOIDECTOMY;  Surgeon: Karis Clunes, MD;  Location: [facility];  Service: ENT;  Laterality: Bilateral;    FRONTAL SINUS EXPLORATION Bilateral 06/18/2019   Procedure: FRONTAL SINUS EXPLORATION;  Surgeon: Karis Clunes, MD;  Location: [facility];  Service: ENT;  Laterality: Bilateral;   LAPAROSCOPIC SUPRACERVICAL HYSTERECTOMY  12/09/2011   Procedure: LAPAROSCOPIC SUPRACERVICAL HYSTERECTOMY;  Surgeon: Gloris DELENA Hugger, MD;  Location: [facility];  Service: Gynecology;  Laterality: N/A;   MAXILLARY ANTROSTOMY Bilateral 06/18/2019   Procedure: MAXILLARY ANTROSTOMY;  Surgeon: Karis Clunes, MD;  Location: [facility];  Service: ENT;  Laterality: Bilateral;   NASAL SINUS SURGERY  11/07   SALPINGOOPHORECTOMY  12/09/2011   Procedure: SALPINGO OOPHERECTOMY;  Surgeon: Gloris DELENA Hugger, MD;  Location: [facility];  Service: Gynecology;  Laterality: Bilateral;   SINUS ENDO WITH FUSION Bilateral 06/18/2019   Procedure: SINUS ENDO WITH FUSION;  Surgeon: Karis Clunes, MD;  Location: [facility];  Service: ENT;  Laterality: Bilateral;   SPHENOIDECTOMY Bilateral 06/18/2019   Procedure: SPHENOIDECTOMY WITH TISSUE REMOVAL;  Surgeon: Karis Clunes, MD;  Location: [facility];  Service: ENT;  Laterality: Bilateral;   SUPRACERVICAL ABDOMINAL HYSTERECTOMY  12/09/2011   Procedure: HYSTERECTOMY SUPRACERVICAL ABDOMINAL;  Surgeon: Gloris DELENA Hugger, MD;  Location: [facility];  Service: Gynecology;  Laterality: N/A;   TUBAL LIGATION     TURBINATE REDUCTION Bilateral 06/18/2019   Procedure: TURBINATE REDUCTION;  Surgeon: Karis Clunes, MD;  Location:  SURGERY CENTER;  Service: ENT;  Laterality: Bilateral;  Social History:  reports that she quit smoking about 22 years ago. Her smoking use included cigarettes. She started smoking about 46 years ago. She has a 6 pack-year smoking history. She has never used smokeless tobacco. She reports that she does not drink alcohol and does not use drugs.   No Known Allergies  Family History  Problem Relation Age of Onset   Hypertension Mother    Cancer  Mother 12       uterine   Hypertension Father    Heart disease Other        MI   Diabetes Other    Stroke Other    Heart disease Brother        age 71 and 73   Cancer Other        lung   Breast cancer Neg Hx     Family history reviewed and not pertinent ***   Prior to Admission medications   Medication Sig Start Date End Date Taking? Authorizing Provider  docusate sodium  (COLACE) 100 MG capsule Take 1 capsule (100 mg total) by mouth 2 (two) times daily. Patient taking differently: Take 100 mg by mouth 2 (two) times daily as needed for moderate constipation. 02/22/22  Yes Mavis Purchase, MD  ezetimibe (ZETIA) 10 MG tablet Take 1 tablet by mouth daily. 11/11/23 11/10/24 Yes [provider]  fluticasone  (FLONASE ) 50 MCG/ACT nasal spray Place 2 sprays into both nostrils daily. Patient taking differently: Place 2 sprays into both nostrils daily as needed for allergies. 07/10/22  Yes Scarboro, Juliene PARAS, NP  loratadine  (CLARITIN ) 10 MG tablet Take 1 tablet (10 mg total) by mouth daily. 08/30/22  Yes Scarboro, Adam J, NP  methocarbamol (ROBAXIN) 500 MG tablet Take 500 mg by mouth as needed for muscle spasms.   Yes [provider]  metoprolol  tartrate (LOPRESSOR ) 25 MG tablet Take 1 tablet (25 mg total) by mouth 2 (two) times daily. 06/13/22  Yes Simmons-Robinson, Makiera, MD  amLODipine  (NORVASC ) 5 MG tablet Take 1 tablet (5 mg total) by mouth daily. Patient not taking: Reported on 04/04/2024 06/13/22   Simmons-Robinson, Rockie, MD  HYDROcodone -acetaminophen  (NORCO/VICODIN) 5-325 MG tablet Take 2 tablets by mouth every 8 (eight) hours as needed. Patient not taking: Reported on 04/04/2024 01/27/24   Ward, Josette SAILOR, DO  ibuprofen  (ADVIL ) 800 MG tablet Take 1 tablet (800 mg total) by mouth every 8 (eight) hours as needed. Patient not taking: Reported on 04/04/2024 01/27/24   Ward, Josette SAILOR, DO  montelukast  (SINGULAIR ) 10 MG tablet Take 1 tablet (10 mg total) by mouth at  bedtime. Patient not taking: Reported on 04/04/2024 12/30/22   Gandhi, Safal, PA-C  ondansetron  (ZOFRAN -ODT) 4 MG disintegrating tablet Take 1 tablet (4 mg total) by mouth every 6 (six) hours as needed for nausea or vomiting. Patient not taking: Reported on 04/04/2024 01/27/24   Ward, Josette SAILOR, DO     Objective    Physical Exam: Vitals:   04/04/24 1736 04/04/24 1800 04/04/24 1813 04/04/24 1948  BP: (!) 158/92 (!) 167/82  (!) 158/81  Pulse:  65  72  Resp:  15  16  Temp:   98.5 F (36.9 C)   TempSrc:   Oral   SpO2:  98%  100%  Weight:      Height:        General: appears to be stated age; alert, oriented Skin: warm, dry, no rash Head:  AT/[place] Mouth:  Oral mucosa membranes appear moist, normal dentition Neck: supple; trachea midline  Heart:  RRR; did not appreciate any M/R/G Lungs: CTAB, did not appreciate any wheezes, rales, or rhonchi Abdomen: + BS; soft, ND, NT Vascular: 2+ pedal pulses b/l; 2+ radial pulses b/l Extremities: no peripheral edema, no muscle wasting Neuro: strength and sensation intact in upper and lower extremities b/l ***   *** Neuro: 5/5 strength of the proximal and distal flexors and extensors of the upper and lower extremities bilaterally; sensation intact in upper and lower extremities b/l; cranial nerves II through XII grossly intact; no pronator drift; no evidence suggestive of slurred speech, dysarthria, or facial droop; Normal muscle tone. No tremors.  *** Neuro: In the setting of the patient's current mental status and associated inability to follow instructions, unable to perform full neurologic exam at this time.  As such, assessment of strength, sensation, and cranial nerves is limited at this time. Patient noted to spontaneously move all 4 extremities. No tremors.  ***    Labs on Admission: I have personally reviewed following labs and imaging studies  CBC: Recent Labs  Lab 04/04/24 1714 04/04/24 1717  WBC  --  10.5  NEUTROABS  --  6.1   HGB 15.0 14.2  HCT 44.0 44.8  MCV  --  89.1  PLT  --  278   Basic Metabolic Panel: Recent Labs  Lab 04/04/24 1714 04/04/24 1717  NA 143 139  K 3.5 3.4*  CL 107 106  CO2  --  23  GLUCOSE 139* 134*  BUN 11 11  CREATININE 0.70 0.71  CALCIUM   --  9.3   GFR: Estimated Creatinine Clearance: 82.2 mL/min (by C-G formula based on SCr of 0.71 mg/dL). Liver Function Tests: Recent Labs  Lab 04/04/24 1717  AST 24  ALT 16  ALKPHOS 84  BILITOT 0.8  PROT 7.1  ALBUMIN 3.9   No results for input(s): LIPASE, AMYLASE in the last 168 hours. No results for input(s): AMMONIA in the last 168 hours. Coagulation Profile: No results for input(s): INR, PROTIME in the last 168 hours. Cardiac Enzymes: No results for input(s): CKTOTAL, CKMB, CKMBINDEX, TROPONINI in the last 168 hours. BNP (last 3 results) No results for input(s): PROBNP in the last 8760 hours. HbA1C: No results for input(s): HGBA1C in the last 72 hours. CBG: No results for input(s): GLUCAP in the last 168 hours. Lipid Profile: No results for input(s): CHOL, HDL, LDLCALC, TRIG, CHOLHDL, LDLDIRECT in the last 72 hours. Thyroid  Function Tests: No results for input(s): TSH, T4TOTAL, FREET4, T3FREE, THYROIDAB in the last 72 hours. Anemia Panel: No results for input(s): VITAMINB12, FOLATE, FERRITIN, TIBC, IRON, RETICCTPCT in the last 72 hours. Urine analysis:    Component Value Date/Time   BILIRUBINUR Negative 08/18/2020 0841   PROTEINUR Negative 08/18/2020 0841   UROBILINOGEN 0.2 08/18/2020 0841   NITRITE Negative 08/18/2020 0841   LEUKOCYTESUR Negative 08/18/2020 0841    Radiological Exams on Admission: MR BRAIN WO CONTRAST Result Date: 04/04/2024 EXAM: MRI BRAIN WITHOUT CONTRAST 04/04/2024 07:07:43 PM TECHNIQUE: Multiplanar multisequence MRI of the head/brain was performed without the administration of intravenous contrast. COMPARISON: None available. CLINICAL  HISTORY: Right hand weakness and numbness. FINDINGS: BRAIN AND VENTRICLES: Multifocal acute ischemia within the left MCA territory predominantly affecting the precentral and postcentral gyri. Multifocal hyperintense T2-weighted signal within the cerebral white matter, most commonly due to chronic small vessel disease. Mild volume loss. No intracranial hemorrhage. No mass. No midline shift. No hydrocephalus. The sella is unremarkable. Normal flow voids. ORBITS: No acute abnormality. SINUSES AND MASTOIDS: Chronic pansinusitis.  BONES AND SOFT TISSUES: Normal marrow signal. No acute soft tissue abnormality. IMPRESSION: 1. Multifocal acute ischemia within the left MCA territory predominantly affecting the precentral and postcentral gyri. 2. Multifocal hyperintense T2-weighted signal within the cerebral white matter, most commonly due to chronic small vessel disease. 3. Mild volume loss. 4. Chronic pansinusitis. Electronically signed by: Franky Stanford MD 04/04/2024 07:52 PM EDT RP Workstation: [id number]   CT ANGIO HEAD NECK W WO CM Result Date: 04/04/2024 CLINICAL DATA:  Provided history: Neuro deficit, acute, stroke suspected. Right hand weakness and numbness. EXAM: CT ANGIOGRAPHY HEAD AND NECK WITH AND WITHOUT CONTRAST TECHNIQUE: Multidetector CT imaging of the head and neck was performed using the standard protocol during bolus administration of intravenous contrast. Multiplanar CT image reconstructions and MIPs were obtained to evaluate the vascular anatomy. Carotid stenosis measurements (when applicable) are obtained utilizing NASCET criteria, using the distal internal carotid diameter as the denominator. RADIATION DOSE REDUCTION: This exam was performed according to the departmental dose-optimization program which includes automated exposure control, adjustment of the mA and/or kV according to patient size and/or use of iterative reconstruction technique. CONTRAST:  75mL OMNIPAQUE  IOHEXOL  350 MG/ML SOLN  COMPARISON:  Same-day brain MRI 04/04/2024. Head CT 01/27/2024. CT angiogram head 07/21/2014. FINDINGS: CT HEAD FINDINGS Brain: No age-advanced or lobar predominant cerebral atrophy. Patchy acute (predominantly cortical) MCA territory infarcts within the left frontal and parietal lobes, better appreciated on the same-day brain MRI. Notable involvement of the pre and postcentral gyri. Small chronic cortical infarct within the left parietal lobe. Chronic lacunar infarct within the left corona radiata. Moderate patchy and ill-defined hypoattenuation elsewhere within the cerebral white matter, nonspecific but compatible with chronic small vessel ischemic disease. There is no acute intracranial hemorrhage. No extra-axial fluid collection. No evidence of an intracranial mass. No midline shift. Vascular: No hyperdense vessel.  Atherosclerotic calcifications. Skull: No calvarial fracture or aggressive osseous lesion. Sinuses/Orbits: No orbital mass or acute orbital finding. Postsurgical appearance of the paranasal sinuses. Near complete opacification of the right frontal sinus. Extensive opacification of the left frontal sinus. Mild bilateral ethmoid sinus disease. Severe right sphenoid sinusitis. Severe left sphenoid sinusitis (with complete sinus opacification). Severe right maxillary sinusitis. Mild mucosal thickening within the left maxillary sinus. Review of the MIP images confirms the above findings CTA NECK FINDINGS Aortic arch: Common origin of the innominate and left common carotid arteries. Atherosclerotic plaque within the aortic arch and proximal major branch vessels of the neck. Streak/beam hardening artifact arising from a dense contrast bolus partially obscures the left subclavian artery. Within this limitation, there is no appreciable hemodynamically significant innominate or proximal subclavian artery stenosis. Right carotid system: CCA and ICA patent within the neck without measurable stenosis.  Atherosclerotic plaque within the mid and distal common carotid artery, about the carotid bifurcation and within the proximal ICA. Partially retropharyngeal course of the cervical ICA. Left carotid system: CCA and ICA patent within the neck without measurable stenosis. Atherosclerotic plaque about the carotid bifurcation and within the proximal ICA. Vertebral arteries: Codominant patent within the neck. Severe atherosclerotic narrowing at the right vertebral artery origin. Skeleton: Nonspecific reversal of the expected cervical lordosis. Mild grade 1 anterolisthesis at C4-C5 and C7-T1. Cervical spondylosis. Other neck: No neck mass or cervical lymphadenopathy. Upper chest: No consolidation within the imaged lung apices. Review of the MIP images confirms the above findings CTA HEAD FINDINGS Anterior circulation: The intracranial internal carotid arteries are patent. Atherosclerotic plaque within both vessels with no more than mild stenosis. The M1  middle cerebral arteries are patent. Atherosclerotic irregularity of the M2 and more distal MCA branches bilaterally. Most notably, there is a severe stenosis within a mid-to-distal left MCA branch (series 18, image 30). No M2 proximal branch occlusion is identified. The anterior cerebral arteries are patent. No intracranial aneurysm is identified. Posterior circulation: The intracranial vertebral arteries are patent. The basilar artery is patent. The posterior cerebral arteries are patent. Atherosclerotic irregularity of both vessels most notably as follows. Moderate-to-severe stenosis within the right posterior cerebral artery at the P2/P3 junction (series 16, image 19). Severe stenosis within the left posterior cerebral artery at the P4 segment level (series 16, image 19). Hypoplastic right P1 segment with sizable right posterior communicating artery. The left posterior communicating artery is diminutive or absent. Venous sinuses: Within the limitations of contrast  timing, no convincing thrombus. Anatomic variants: As described Review of the MIP images confirms the above findings IMPRESSION: Non-contrast head CT: 1. Patchy acute (predominantly cortical) middle cerebral artery territory infarcts within the left frontal and parietal lobes, better delineated on the same-day brain MRI. Notable involvement of the pre and postcentral gyri. 2. Background chronic small vessel ischemic disease and chronic infarcts, as described. 3. Extensive paranasal sinus disease as outlined. CTA neck: 1. The common carotid and internal carotid arteries are patent in the neck without stenosis. Atherosclerotic plaque bilaterally, as described. 2. Vertebral arteries patent within the neck. Severe atherosclerotic narrowing of the right vertebral artery origin. 3. Aortic Atherosclerosis (ICD10-I70.0). CTA head: 1. No proximal intracranial large vessel occlusion is identified. 2. Intracranial atherosclerotic disease with multifocal stenoses, most notably as follows. 3. Severe stenosis within a mid-to-distal left middle cerebral artery branch. 4. Moderate-to-severe stenosis within the right posterior cerebral artery at the P2/P3 junction. 5. Severe stenosis within the left posterior cerebral artery at the P4 segment level. Electronically Signed   By: Rockey Childs D.O.   On: 04/04/2024 19:47      Assessment/Plan   Principal Problem:   Acute ischemic stroke (HCC)   ***            ***                  ***                   ***                  ***                  ***                  ***                   ***                  ***                  ***                  ***                  ***                 ***                ***  DVT prophylaxis: SCD's ***  Code Status:  Full code*** Family Communication: none*** Disposition Plan: Per Rounding Team Consults called: EDP d/w on-call neurology, Dr. Lindzen,  followed by Dr. Sallyann. Neurology felt that TNK was not warranted given the mild and improving nature of pt's deficits. Neurology to formally consult, with Dr. Sallyann, recommending DAP therapy, with additional recs to follow. ;  Admission status: ***     I SPENT GREATER THAN 75 *** MINUTES IN CLINICAL CARE TIME/MEDICAL DECISION-MAKING IN COMPLETING THIS ADMISSION.      Eva NOVAK Thom Ollinger DO [facility]  From 7PM - 7AM   04/04/2024, 9:05 PM   ***

## 2024-04-04 NOTE — ED Triage Notes (Signed)
 Pt ambulatory to triage C/O bilateral hand numbness starting just after 1500. States initially numbness in both hands, only R hand numbness has persisted. Also endorses feeling lightheaded.   LKW 1500

## 2024-04-05 ENCOUNTER — Observation Stay (HOSPITAL_COMMUNITY)

## 2024-04-05 DIAGNOSIS — I63412 Cerebral infarction due to embolism of left middle cerebral artery: Secondary | ICD-10-CM

## 2024-04-05 DIAGNOSIS — R29701 NIHSS score 1: Secondary | ICD-10-CM | POA: Diagnosis not present

## 2024-04-05 DIAGNOSIS — R29898 Other symptoms and signs involving the musculoskeletal system: Secondary | ICD-10-CM

## 2024-04-05 DIAGNOSIS — E785 Hyperlipidemia, unspecified: Secondary | ICD-10-CM

## 2024-04-05 DIAGNOSIS — E876 Hypokalemia: Secondary | ICD-10-CM | POA: Diagnosis present

## 2024-04-05 DIAGNOSIS — I639 Cerebral infarction, unspecified: Secondary | ICD-10-CM | POA: Diagnosis not present

## 2024-04-05 LAB — LIPID PANEL
Cholesterol: 189 mg/dL (ref 0–200)
HDL: 34 mg/dL — ABNORMAL LOW (ref >40)
LDL Cholesterol: 123 mg/dL — ABNORMAL HIGH (ref 0–99)
Total CHOL/HDL Ratio: 5.6 ratio
Triglycerides: 159 mg/dL — ABNORMAL HIGH (ref <150)
VLDL: 32 mg/dL (ref 0–40)

## 2024-04-05 LAB — COMPREHENSIVE METABOLIC PANEL WITH GFR
ALT: 15 U/L (ref 0–44)
AST: 16 U/L (ref 15–41)
Albumin: 3.4 g/dL — ABNORMAL LOW (ref 3.5–5.0)
Alkaline Phosphatase: 82 U/L (ref 38–126)
Anion gap: 11 (ref 5–15)
BUN: 10 mg/dL (ref 8–23)
CO2: 21 mmol/L — ABNORMAL LOW (ref 22–32)
Calcium: 8.9 mg/dL (ref 8.9–10.3)
Chloride: 107 mmol/L (ref 98–111)
Creatinine, Ser: 0.65 mg/dL (ref 0.44–1.00)
GFR, Estimated: >60 mL/min (ref >60)
Glucose, Bld: 111 mg/dL — ABNORMAL HIGH (ref 70–99)
Potassium: 3.8 mmol/L (ref 3.5–5.1)
Sodium: 139 mmol/L (ref 135–145)
Total Bilirubin: 0.7 mg/dL (ref 0.0–1.2)
Total Protein: 6.3 g/dL — ABNORMAL LOW (ref 6.5–8.1)

## 2024-04-05 LAB — ECHOCARDIOGRAM COMPLETE
AR max vel: 2.56 cm2
AV Area VTI: 3.12 cm2
AV Area mean vel: 2.43 cm2
AV Mean grad: 5 mmHg
AV Peak grad: 7.4 mmHg
Ao pk vel: 1.36 m/s
Area-P 1/2: 2.51 cm2
Height: 67 in
S' Lateral: 2.2 cm
Weight: 3294.55 [oz_av]

## 2024-04-05 LAB — CBC WITH DIFFERENTIAL/PLATELET
Abs Immature Granulocytes: 0.02 K/uL (ref 0.00–0.07)
Basophils Absolute: 0 K/uL (ref 0.0–0.1)
Basophils Relative: 1 %
Eosinophils Absolute: 0.2 K/uL (ref 0.0–0.5)
Eosinophils Relative: 3 %
HCT: 42.5 % (ref 36.0–46.0)
Hemoglobin: 13.5 g/dL (ref 12.0–15.0)
Immature Granulocytes: 0 %
Lymphocytes Relative: 41 %
Lymphs Abs: 3 K/uL (ref 0.7–4.0)
MCH: 28.4 pg (ref 26.0–34.0)
MCHC: 31.8 g/dL (ref 30.0–36.0)
MCV: 89.3 fL (ref 80.0–100.0)
Monocytes Absolute: 0.5 K/uL (ref 0.1–1.0)
Monocytes Relative: 7 %
Neutro Abs: 3.6 K/uL (ref 1.7–7.7)
Neutrophils Relative %: 48 %
Platelets: 279 K/uL (ref 150–400)
RBC: 4.76 MIL/uL (ref 3.87–5.11)
RDW: 12.7 % (ref 11.5–15.5)
WBC: 7.4 K/uL (ref 4.0–10.5)
nRBC: 0 % (ref 0.0–0.2)

## 2024-04-05 LAB — MAGNESIUM: Magnesium: 2.1 mg/dL (ref 1.7–2.4)

## 2024-04-05 MED ORDER — CLOPIDOGREL BISULFATE 75 MG PO TABS: 75.0000 mg | ORAL_TABLET | Freq: Every day | ORAL | 0 refills | Status: DC

## 2024-04-05 MED ORDER — LORATADINE 10 MG PO TABS
10.0000 mg | ORAL_TABLET | Freq: Every day | ORAL | Status: DC
  Administered 2024-04-05: 10 mg via ORAL
  Filled 2024-04-05: qty 1

## 2024-04-05 MED ORDER — EZETIMIBE 10 MG PO TABS
10.0000 mg | ORAL_TABLET | Freq: Every day | ORAL | Status: DC
  Administered 2024-04-05: 10 mg via ORAL
  Filled 2024-04-05: qty 1

## 2024-04-05 MED ORDER — ASPIRIN 81 MG PO TBEC: 81.0000 mg | DELAYED_RELEASE_TABLET | Freq: Every day | ORAL | 0 refills | Status: AC

## 2024-04-05 MED ORDER — ROSUVASTATIN CALCIUM 5 MG PO TABS
10.0000 mg | ORAL_TABLET | Freq: Every day | ORAL | Status: DC
  Administered 2024-04-05: 10 mg via ORAL
  Filled 2024-04-05: qty 2

## 2024-04-05 MED ORDER — ROSUVASTATIN CALCIUM 10 MG PO TABS: 10.0000 mg | ORAL_TABLET | Freq: Every day | ORAL | 0 refills | Status: AC

## 2024-04-05 NOTE — TOC Transition Note (Signed)
 Transition of Care [facility]) - Discharge Note   Patient Details  Name: Barbara Kaiser MRN: [id number] Date of Birth: 07-Feb-1956  Transition of Care [facility] CM/SW Contact:  Andrez JULIANNA George, RN Phone Number: 04/05/2024, 3:49 PM   Clinical Narrative:    Barbara Kaiser is a 68 y.o. female with medical history significant for essential hypertension, hyperlipidemia, who is admitted to [facility] on 04/04/2024 with acute ischemic left MCA stroke after presenting from home to [facility] ED complaining of right upper extremity weakness.   Pt will discharge home with outpatient therapy referral sent to [facility]. Information on the AVS.  DME at home: walker/ BSC/ shower seat--no new DME recommendations. Pt drives self but friend she lives with can also drive. She manages her own medications and denies issues.   Pt has transportation home.   Final next level of care: OP Rehab Barriers to Discharge: No Barriers Identified   Patient Goals and CMS Choice     Choice offered to / list presented to : Patient      Discharge Placement                       Discharge Plan and Services Additional resources added to the After Visit Summary for                                       Social Drivers of Health (SDOH) Interventions SDOH Screenings   Food Insecurity: No Food Insecurity (04/05/2024)  Housing: Low Risk  (04/05/2024)  Transportation Needs: No Transportation Needs (04/05/2024)  Utilities: Not At Risk (04/05/2024)  Alcohol Screen: Low Risk  (08/16/2021)  Depression (PHQ2-9): Low Risk  (08/16/2021)  Financial Resource Strain: Low Risk  (08/29/2023)   Received from [facility]  Social Connections: Moderately Integrated (04/05/2024)  Tobacco Use: Medium Risk (04/04/2024)     Readmission Risk Interventions     No data to display

## 2024-04-05 NOTE — ED Notes (Signed)
 PT at bedside.

## 2024-04-05 NOTE — Evaluation (Signed)
 Occupational Therapy Evaluation Patient Details Name: Barbara Kaiser MRN: [id number] DOB: Oct 19, 1955 Today's Date: 04/05/2024   History of Present Illness   Pt is 68 yo presenting to [facility] on 10/5 due to acute ischemic L MCA stroke after presenting with RUE weakness. PMH: HTN, hyperlipidemia, asthma.     Clinical Impressions Pt is functioning modified independently in ADLs and mobility. She presents with R hand weakness and incoordination. Educated in resistance foam block, theraputty and fine motor manipulation activities. Pt having difficulty returning demonstration of hand position with block activities. Session limited by arrival of echo technician, pt will need repetition of instruction. Encouraged pt to incorporate use of R UE in ADLs when safe. Recommend OPOT.     If plan is discharge home, recommend the following:   Assistance with cooking/housework     Functional Status Assessment   Patient has had a recent decline in their functional status and demonstrates the ability to make significant improvements in function in a reasonable and predictable amount of time.     Equipment Recommendations   None recommended by OT     Recommendations for Other Services         Precautions/Restrictions   Precautions Recall of Precautions/Restrictions: Intact Restrictions Weight Bearing Restrictions Per Provider Order: No     Mobility Bed Mobility Overal bed mobility: Modified Independent             General bed mobility comments: HOB up    Transfers Overall transfer level: Modified independent Equipment used: None               General transfer comment: Steady on standing without an AD      Balance Overall balance assessment: Modified Independent                                         ADL either performed or assessed with clinical judgement   ADL Overall ADL's : Modified independent                                              Vision Ability to See in Adequate Light: 0 Adequate Patient Visual Report: No change from baseline       Perception         Praxis         Pertinent Vitals/Pain Pain Assessment Pain Assessment: No/denies pain     Extremity/Trunk Assessment Upper Extremity Assessment Upper Extremity Assessment: Right hand dominant;RUE deficits/detail RUE Deficits / Details: 4/5 gross grasp, can oppose thumb to lateral DIP of first finger only RUE Sensation: decreased proprioception RUE Coordination: decreased fine motor   Lower Extremity Assessment Lower Extremity Assessment: Defer to PT evaluation   Cervical / Trunk Assessment Cervical / Trunk Assessment: Normal   Communication Communication Communication: No apparent difficulties   Cognition Arousal: Alert Behavior During Therapy: WFL for tasks assessed/performed Cognition: No apparent impairments                               Following commands: Intact       Cueing  General Comments   Cueing Techniques: Verbal cues  No signs/symptoms of cardiac/respiratory distress during session. Pt demonstrates no deviations with heel/shin slides, toe taps. Pt has  difficulty with opposition on the R hand but overall demonstrates no deviations with ramps.   Exercises Exercises: Other exercises Other Exercises Other Exercises: yellow theraputty exercises, R hand Other Exercises: blue foam block exercises R hand Other Exercises: Fine motor coordination activities   Shoulder Instructions      Home Living Family/patient expects to be discharged to:: Private residence Living Arrangements: Non-relatives/Friends Available Help at Discharge: Family;Available 24 hours/day Type of Home: House Home Access: Stairs to enter [employer] of Steps: 3 at side with no rail and 8 at rails at front; pt goes through side   Home Layout: One level     Bathroom Shower/Tub: Chief Strategy Officer:  Standard     Home Equipment: Agricultural consultant (2 wheels);BSC/3in1          Prior Functioning/Environment Prior Level of Function : Independent/Modified Independent;Working/employed;Driving               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: Decreased coordination;Decreased strength;Impaired UE functional use   OT Treatment/Interventions: Neuromuscular education;Therapeutic exercise      OT Goals(Current goals can be found in the care plan section)   Acute Rehab OT Goals OT Goal Formulation: With patient Time For Goal Achievement: 04/19/24 Potential to Achieve Goals: Good   OT Frequency:  Min 2X/week    Co-evaluation              AM-PAC OT 6 Clicks Daily Activity     Outcome Measure Help from another person eating meals?: None Help from another person taking care of personal grooming?: None Help from another person toileting, which includes using toliet, bedpan, or urinal?: None Help from another person bathing (including washing, rinsing, drying)?: None Help from another person to put on and taking off regular upper body clothing?: None Help from another person to put on and taking off regular lower body clothing?: None 6 Click Score: 24   End of Session    Activity Tolerance: Patient tolerated treatment well Patient left: in bed;with call bell/phone within reach;with nursing/sitter in room;Other (comment) (echo in room)  OT Visit Diagnosis: Muscle weakness (generalized) (M62.81)                Time: 1352-1410 OT Time Calculation (min): 18 min Charges:  OT General Charges $OT Visit: 1 Visit OT Evaluation $OT Eval Low Complexity: 1 Low  Mliss HERO, OTR/L Acute Rehabilitation Services Office: [phone]   Kennth Mliss Helling 04/05/2024, 2:39 PM

## 2024-04-05 NOTE — Discharge Instructions (Signed)
 Patient question: Can I return to work right away? Per Dr. Powell:can give her a week to follow with PCP and establish with therapy - might need longer off, but can get updated recommendations from outpatient doc

## 2024-04-05 NOTE — Discharge Summary (Signed)
 Physician Discharge Summary  Barbara Kaiser FMW:[id number] DOB: April 28, 1956 DOA: 04/04/2024  PCP: Bertrum Charlie CROME, MD  Admit date: 04/04/2024 Discharge date: 04/05/2024  Time spent: 40 minutes  Recommendations for Outpatient Follow-up:  Follow outpatient CBC/CMP  Needs neurology follow up outpatient DAPTx3 weeks, followed by aspirin alone Follow lipid panel outpatient on statin Needs titration of BP outpatient  Needs cardiac event monitor outpatient    Discharge Diagnoses:  Principal Problem:   Acute ischemic stroke [facility]) Active Problems:   HLD (hyperlipidemia)   Essential hypertension   Allergic rhinitis   Hypokalemia   Right hand weakness   Discharge Condition: stable  Diet recommendation: heart healthy  Filed Weights   04/04/24 1625  Weight: 93.4 kg    History of present illness:   Barbara Kaiser is Barbara Kaiser 68 y.o. female with medical history significant for essential hypertension, hyperlipidemia, who is admitted to [facility] on 04/04/2024 with acute ischemic left MCA stroke after presenting from home to [facility] ED complaining of right upper extremity weakness.   Admitted for acute stroke.  Stable for discharge 10/6.   Hospital Course:  Assessment and Plan:  Multifocal Acute Ischemia within L MCA Territory Acute Stroke MRI with multifocal ischemia within L MCA territory, multifocal hyperintense T2 weighted signal within the cerebral white matter CT head with patchy acute middle cerebral artery territory infarcts within L frontal and parietal lobes, chronic small vessel ischemic disease and chronic infarcts  CTA head/neck common carotid and internal carotid arteries patent without stenosis - atherosclerotic plaque bilaterally - severe atherosclerotic narrowing of the R vertebral artery origin.  CTA head with intracranial atherosclerotic disease with multifocal stenoses.  Severe stenosis within Negin Hegg mid to distal L MCA branch.  Moderate to severe stenosis within the R  posterior cerebral artery at the P2/P3 junction.  Severe stenosis within the L posterior cerebral artery at the P4 segment level. Echo with preserved EF, diastolic dysfunction LDL 123, J8r 5.4 PT/OT/SLP - recommending outpatient therapy Appreciate neurology recommendations - cocnern for embolic cryptogenic source, recommending prolonged cardiac monitoring to look for paroxysmal afib.  DAPTx3 weeks followed by aspirin alone.  Statin.  Outpatient therapy.  Neurology follow up outpatient.  Hypertension Continue metop BP on high side, will need titration outpatient  Dyslipidemia Follow outpatient   Obesity Body mass index is 32.25 kg/m.      Procedures: Echo IMPRESSIONS     1. Left ventricular ejection fraction, by estimation, is 65 to 70%. The  left ventricle has normal function. The left ventricle has no regional  wall motion abnormalities. There is mild left ventricular hypertrophy.  Left ventricular diastolic parameters  are consistent with Grade I diastolic dysfunction (impaired relaxation).   2. Right ventricular systolic function is normal. The right ventricular  size is normal.   3. The mitral valve is normal in structure. No evidence of mitral valve  regurgitation. No evidence of mitral stenosis.   4. The aortic valve is tricuspid. Aortic valve regurgitation is not  visualized. No aortic stenosis is present.   5. The inferior vena cava is normal in size with greater than 50%  respiratory variability, suggesting right atrial pressure of 3 mmHg.   Conclusion(s)/Recommendation(s): No intracardiac source of embolism  detected on this transthoracic study. Consider Michella Detjen transesophageal  echocardiogram to exclude cardiac source of embolism if clinically  indicated.    Consultations: neurology  Discharge Exam: Vitals:   04/05/24 1242 04/05/24 1546  BP: (!) 159/81 (!) 162/84  Pulse: 63 71  Resp: 16  16  Temp: 98.1 F (36.7 C) 98.5 F (36.9 C)  SpO2: 99% 100%     General: No acute distress. Cardiovascular: Heart sounds show Nico Rogness regular rate, and rhythm. No gallops or rubs. No murmurs. No JVD. Lungs: Clear to auscultation bilaterally with good air movement. No rales, rhonchi or wheezes. Abdomen: Soft, nontender, nondistended with normal active bowel sounds. No masses. No hepatosplenomegaly. Neurological: Alert and oriented 3. CN 2-12 intact.  Relatively symmetric strength (mild RUE weakness/grip) - intact FNF.   Extremities: No clubbing or cyanosis. No edema.   Discharge Instructions   Discharge Instructions     Ambulatory referral to Neurology   Complete by: As directed    An appointment is requested in approximately: 4 weeks   Ambulatory referral to Occupational Therapy   Complete by: As directed    Ambulatory referral to Physical Therapy   Complete by: As directed    Call MD for:  difficulty breathing, headache or visual disturbances   Complete by: As directed    Call MD for:  extreme fatigue   Complete by: As directed    Call MD for:  hives   Complete by: As directed    Call MD for:  persistant dizziness or light-headedness   Complete by: As directed    Call MD for:  persistant nausea and vomiting   Complete by: As directed    Call MD for:  redness, tenderness, or signs of infection (pain, swelling, redness, odor or green/yellow discharge around incision site)   Complete by: As directed    Call MD for:  severe uncontrolled pain   Complete by: As directed    Call MD for:  temperature >100.4   Complete by: As directed    Diet - low sodium heart healthy   Complete by: As directed    Discharge instructions   Complete by: As directed    You were seen for [name] stroke.  You've been seen by neurology.  They recommend aspirin and plavix for 21 days, then take aspirin alone.  We've started you on crestor to help reduce your cholesterol and reduce the risk of future stroke.  You had an ultrasound of the heart which is reassuring.  We're  going to arrange Mitesh Rosendahl cardiac monitor for you.  Cardiology should call you regarding this.  Your blood pressure is on the high side.  Continue your metoprolol .  You should follow up with your PCP for additional titration/adjustment of your blood pressure medicines.   Return for new, recurrent, or worsening symptoms.  Please ask your PCP to request records from this hospitalization so they know what was done and what the next steps will be.   Increase activity slowly   Complete by: As directed       Allergies as of 04/05/2024   No Known Allergies      Medication List     STOP taking these medications    amLODipine  5 MG tablet Commonly known as: NORVASC    ibuprofen  800 MG tablet Commonly known as: ADVIL        TAKE these medications    aspirin EC 81 MG tablet Take 1 tablet (81 mg total) by mouth daily. Swallow whole.   clopidogrel 75 MG tablet Commonly known as: Plavix Take 1 tablet (75 mg total) by mouth daily for 21 days.   docusate sodium  100 MG capsule Commonly known as: COLACE Take 1 capsule (100 mg total) by mouth 2 (two) times daily. What changed:  when to take  this reasons to take this   ezetimibe 10 MG tablet Commonly known as: ZETIA Take 1 tablet by mouth daily.   fluticasone  50 MCG/ACT nasal spray Commonly known as: FLONASE  Place 2 sprays into both nostrils daily. What changed:  when to take this reasons to take this   HYDROcodone -acetaminophen  5-325 MG tablet Commonly known as: NORCO/VICODIN Take 2 tablets by mouth every 8 (eight) hours as needed.   loratadine  10 MG tablet Commonly known as: CLARITIN  Take 1 tablet (10 mg total) by mouth daily.   methocarbamol 500 MG tablet Commonly known as: ROBAXIN Take 500 mg by mouth as needed for muscle spasms.   metoprolol  tartrate 25 MG tablet Commonly known as: LOPRESSOR  Take 1 tablet (25 mg total) by mouth 2 (two) times daily.   montelukast  10 MG tablet Commonly known as: SINGULAIR  Take 1 tablet  (10 mg total) by mouth at bedtime.   ondansetron  4 MG disintegrating tablet Commonly known as: ZOFRAN -ODT Take 1 tablet (4 mg total) by mouth every 6 (six) hours as needed for nausea or vomiting.   rosuvastatin 10 MG tablet Commonly known as: CRESTOR Take 1 tablet (10 mg total) by mouth daily. Start taking on: April 06, 2024       No Known Allergies  Follow-up Information     [facility]. Schedule an appointment as soon as possible for Jarris Kortz visit.   Specialty: Rehabilitation Contact information: [address] [place]  [address] [phone]        Bertrum Charlie CROME, MD Follow up.   Specialty: Family Medicine Contact information: [address] [place] [address] [phone]         [facility] Follow up.   Contact information: [address]     [address] [place]  [address] [phone]                 The results of significant diagnostics from this hospitalization (including imaging, microbiology, ancillary and laboratory) are listed below for reference.    Significant Diagnostic Studies: ECHOCARDIOGRAM COMPLETE Result Date: 04/05/2024    ECHOCARDIOGRAM REPORT   Patient Name:   IREM STONEHAM Date of Exam: 04/05/2024 Medical Rec #:  [id number]      Height:       67.0 in Accession #:    [id number]     Weight:       205.9 lb Date of Birth:  1956/03/10      BSA:          2.047 m Patient Age:    67 years       BP:           159/81 mmHg Patient Gender: F              HR:           73 bpm. Exam Location:  Inpatient Procedure: 2D Echo, Cardiac Doppler and Color Doppler (Both Spectral and Color            Flow Doppler were utilized during procedure). Indications:    TIA G45.9  History:        Patient has no prior history of Echocardiogram examinations.                 Risk Factors:Hypertension.  Sonographer:    Jayson Gaskins Referring Phys: [id number]  JUSTIN B HOWERTER IMPRESSIONS  1. Left ventricular ejection fraction, by estimation, is 65 to 70%. The left ventricle has normal  function. The left ventricle has no regional wall motion abnormalities. There is mild left ventricular hypertrophy. Left ventricular diastolic parameters are consistent with Grade I diastolic dysfunction (impaired relaxation).  2. Right ventricular systolic function is normal. The right ventricular size is normal.  3. The mitral valve is normal in structure. No evidence of mitral valve regurgitation. No evidence of mitral stenosis.  4. The aortic valve is tricuspid. Aortic valve regurgitation is not visualized. No aortic stenosis is present.  5. The inferior vena cava is normal in size with greater than 50% respiratory variability, suggesting right atrial pressure of 3 mmHg. Conclusion(s)/Recommendation(s): No intracardiac source of embolism detected on this transthoracic study. Consider Rayen Palen transesophageal echocardiogram to exclude cardiac source of embolism if clinically indicated. FINDINGS  Left Ventricle: Left ventricular ejection fraction, by estimation, is 65 to 70%. The left ventricle has normal function. The left ventricle has no regional wall motion abnormalities. The left ventricular internal cavity size was normal in size. There is  mild left ventricular hypertrophy. Left ventricular diastolic parameters are consistent with Grade I diastolic dysfunction (impaired relaxation). Right Ventricle: The right ventricular size is normal. No increase in right ventricular wall thickness. Right ventricular systolic function is normal. Left Atrium: Left atrial size was normal in size. Right Atrium: Right atrial size was normal in size. Pericardium: There is no evidence of pericardial effusion. Mitral Valve: The mitral valve is normal in structure. No evidence of mitral valve regurgitation. No evidence of mitral valve stenosis. Tricuspid Valve: The tricuspid valve is normal in structure.  Tricuspid valve regurgitation is not demonstrated. No evidence of tricuspid stenosis. Aortic Valve: The aortic valve is tricuspid. Aortic valve regurgitation is not visualized. No aortic stenosis is present. Aortic valve mean gradient measures 5.0 mmHg. Aortic valve peak gradient measures 7.4 mmHg. Aortic valve area, by VTI measures 3.12 cm. Pulmonic Valve: The pulmonic valve was normal in structure. Pulmonic valve regurgitation is not visualized. No evidence of pulmonic stenosis. Aorta: The aortic root is normal in size and structure. Venous: The inferior vena cava is normal in size with greater than 50% respiratory variability, suggesting right atrial pressure of 3 mmHg. IAS/Shunts: No atrial level shunt detected by color flow Doppler.  LEFT VENTRICLE PLAX 2D LVIDd:         3.60 cm   Diastology LVIDs:         2.20 cm   LV e' medial:    5.00 cm/s LV PW:         1.10 cm   LV E/e' medial:  9.5 LV IVS:        1.20 cm   LV e' lateral:   8.05 cm/s LVOT diam:     1.90 cm   LV E/e' lateral: 5.9 LV SV:         63 LV SV Index:   31 LVOT Area:     2.84 cm  RIGHT VENTRICLE RV S prime:     16.50 cm/s TAPSE (M-mode): 2.5 cm LEFT ATRIUM             Index        RIGHT ATRIUM           Index LA Vol (A2C):   33.7 ml 16.46 ml/m  RA Area:     18.40 cm LA Vol (A4C):   40.0 ml 19.54 ml/m  RA Volume:   50.50 ml  24.67 ml/m LA Biplane Vol: 40.3 ml 19.68 ml/m  AORTIC VALVE AV Area (Vmax):  2.56 cm AV Area (Vmean):   2.43 cm AV Area (VTI):     3.12 cm AV Vmax:           136.00 cm/s AV Vmean:          102.000 cm/s AV VTI:            0.201 m AV Peak Grad:      7.4 mmHg AV Mean Grad:      5.0 mmHg LVOT Vmax:         123.00 cm/s LVOT Vmean:        87.300 cm/s LVOT VTI:          0.221 m LVOT/AV VTI ratio: 1.10  AORTA Ao Root diam: 2.80 cm MITRAL VALVE MV Area (PHT): 2.51 cm    SHUNTS MV Decel Time: 302 msec    Systemic VTI:  0.22 m MV E velocity: 47.70 cm/s  Systemic Diam: 1.90 cm MV Yaileen Hofferber velocity: 85.40 cm/s MV E/Mabel Unrein ratio:  0.56 Oneil Parchment MD Electronically signed by Oneil Parchment MD Signature Date/Time: 04/05/2024/4:37:57 PM    Final    MR BRAIN WO CONTRAST Result Date: 04/04/2024 EXAM: MRI BRAIN WITHOUT CONTRAST 04/04/2024 07:07:43 PM TECHNIQUE: Multiplanar multisequence MRI of the head/brain was performed without the administration of intravenous contrast. COMPARISON: None available. CLINICAL HISTORY: Right hand weakness and numbness. FINDINGS: BRAIN AND VENTRICLES: Multifocal acute ischemia within the left MCA territory predominantly affecting the precentral and postcentral gyri. Multifocal hyperintense T2-weighted signal within the cerebral white matter, most commonly due to chronic small vessel disease. Mild volume loss. No intracranial hemorrhage. No mass. No midline shift. No hydrocephalus. The sella is unremarkable. Normal flow voids. ORBITS: No acute abnormality. SINUSES AND MASTOIDS: Chronic pansinusitis. BONES AND SOFT TISSUES: Normal marrow signal. No acute soft tissue abnormality. IMPRESSION: 1. Multifocal acute ischemia within the left MCA territory predominantly affecting the precentral and postcentral gyri. 2. Multifocal hyperintense T2-weighted signal within the cerebral white matter, most commonly due to chronic small vessel disease. 3. Mild volume loss. 4. Chronic pansinusitis. Electronically signed by: Franky Stanford MD 04/04/2024 07:52 PM EDT RP Workstation: [id number]   CT ANGIO HEAD NECK W WO CM Result Date: 04/04/2024 CLINICAL DATA:  Provided history: Neuro deficit, acute, stroke suspected. Right hand weakness and numbness. EXAM: CT ANGIOGRAPHY HEAD AND NECK WITH AND WITHOUT CONTRAST TECHNIQUE: Multidetector CT imaging of the head and neck was performed using the standard protocol during bolus administration of intravenous contrast. Multiplanar CT image reconstructions and MIPs were obtained to evaluate the vascular anatomy. Carotid stenosis measurements (when applicable) are obtained utilizing NASCET criteria, using  the distal internal carotid diameter as the denominator. RADIATION DOSE REDUCTION: This exam was performed according to the departmental dose-optimization program which includes automated exposure control, adjustment of the mA and/or kV according to patient size and/or use of iterative reconstruction technique. CONTRAST:  75mL OMNIPAQUE  IOHEXOL  350 MG/ML SOLN COMPARISON:  Same-day brain MRI 04/04/2024. Head CT 01/27/2024. CT angiogram head 07/21/2014. FINDINGS: CT HEAD FINDINGS Brain: No age-advanced or lobar predominant cerebral atrophy. Patchy acute (predominantly cortical) MCA territory infarcts within the left frontal and parietal lobes, better appreciated on the same-day brain MRI. Notable involvement of the pre and postcentral gyri. Small chronic cortical infarct within the left parietal lobe. Chronic lacunar infarct within the left corona radiata. Moderate patchy and ill-defined hypoattenuation elsewhere within the cerebral white matter, nonspecific but compatible with chronic small vessel ischemic disease. There is no acute intracranial hemorrhage. No extra-axial fluid collection. No evidence  of an intracranial mass. No midline shift. Vascular: No hyperdense vessel.  Atherosclerotic calcifications. Skull: No calvarial fracture or aggressive osseous lesion. Sinuses/Orbits: No orbital mass or acute orbital finding. Postsurgical appearance of the paranasal sinuses. Near complete opacification of the right frontal sinus. Extensive opacification of the left frontal sinus. Mild bilateral ethmoid sinus disease. Severe right sphenoid sinusitis. Severe left sphenoid sinusitis (with complete sinus opacification). Severe right maxillary sinusitis. Mild mucosal thickening within the left maxillary sinus. Review of the MIP images confirms the above findings CTA NECK FINDINGS Aortic arch: Common origin of the innominate and left common carotid arteries. Atherosclerotic plaque within the aortic arch and proximal major  branch vessels of the neck. Streak/beam hardening artifact arising from Yadier Bramhall dense contrast bolus partially obscures the left subclavian artery. Within this limitation, there is no appreciable hemodynamically significant innominate or proximal subclavian artery stenosis. Right carotid system: CCA and ICA patent within the neck without measurable stenosis. Atherosclerotic plaque within the mid and distal common carotid artery, about the carotid bifurcation and within the proximal ICA. Partially retropharyngeal course of the cervical ICA. Left carotid system: CCA and ICA patent within the neck without measurable stenosis. Atherosclerotic plaque about the carotid bifurcation and within the proximal ICA. Vertebral arteries: Codominant patent within the neck. Severe atherosclerotic narrowing at the right vertebral artery origin. Skeleton: Nonspecific reversal of the expected cervical lordosis. Mild grade 1 anterolisthesis at C4-C5 and C7-T1. Cervical spondylosis. Other neck: No neck mass or cervical lymphadenopathy. Upper chest: No consolidation within the imaged lung apices. Review of the MIP images confirms the above findings CTA HEAD FINDINGS Anterior circulation: The intracranial internal carotid arteries are patent. Atherosclerotic plaque within both vessels with no more than mild stenosis. The M1 middle cerebral arteries are patent. Atherosclerotic irregularity of the M2 and more distal MCA branches bilaterally. Most notably, there is Joie Reamer severe stenosis within Montavious Wierzba mid-to-distal left MCA branch (series 18, image 30). No M2 proximal branch occlusion is identified. The anterior cerebral arteries are patent. No intracranial aneurysm is identified. Posterior circulation: The intracranial vertebral arteries are patent. The basilar artery is patent. The posterior cerebral arteries are patent. Atherosclerotic irregularity of both vessels most notably as follows. Moderate-to-severe stenosis within the right posterior cerebral  artery at the P2/P3 junction (series 16, image 19). Severe stenosis within the left posterior cerebral artery at the P4 segment level (series 16, image 19). Hypoplastic right P1 segment with sizable right posterior communicating artery. The left posterior communicating artery is diminutive or absent. Venous sinuses: Within the limitations of contrast timing, no convincing thrombus. Anatomic variants: As described Review of the MIP images confirms the above findings IMPRESSION: Non-contrast head CT: 1. Patchy acute (predominantly cortical) middle cerebral artery territory infarcts within the left frontal and parietal lobes, better delineated on the same-day brain MRI. Notable involvement of the pre and postcentral gyri. 2. Background chronic small vessel ischemic disease and chronic infarcts, as described. 3. Extensive paranasal sinus disease as outlined. CTA neck: 1. The common carotid and internal carotid arteries are patent in the neck without stenosis. Atherosclerotic plaque bilaterally, as described. 2. Vertebral arteries patent within the neck. Severe atherosclerotic narrowing of the right vertebral artery origin. 3. Aortic Atherosclerosis (ICD10-I70.0). CTA head: 1. No proximal intracranial large vessel occlusion is identified. 2. Intracranial atherosclerotic disease with multifocal stenoses, most notably as follows. 3. Severe stenosis within Byanca Kasper mid-to-distal left middle cerebral artery branch. 4. Moderate-to-severe stenosis within the right posterior cerebral artery at the P2/P3 junction. 5. Severe stenosis within  the left posterior cerebral artery at the P4 segment level. Electronically Signed   By: Rockey Childs D.O.   On: 04/04/2024 19:47    Microbiology: No results found for this or any previous visit (from the past 240 hours).   Labs: Basic Metabolic Panel: Recent Labs  Lab 04/04/24 1714 04/04/24 1717 04/05/24 0330  NA 143 139 139  K 3.5 3.4* 3.8  CL 107 106 107  CO2  --  23 21*  GLUCOSE  139* 134* 111*  BUN 11 11 10   CREATININE 0.70 0.71 0.65  CALCIUM   --  9.3 8.9  MG  --  2.2 2.1   Liver Function Tests: Recent Labs  Lab 04/04/24 1717 04/05/24 0330  AST 24 16  ALT 16 15  ALKPHOS 84 82  BILITOT 0.8 0.7  PROT 7.1 6.3*  ALBUMIN 3.9 3.4*   No results for input(s): LIPASE, AMYLASE in the last 168 hours. No results for input(s): AMMONIA in the last 168 hours. CBC: Recent Labs  Lab 04/04/24 1714 04/04/24 1717 04/05/24 0330  WBC  --  10.5 7.4  NEUTROABS  --  6.1 3.6  HGB 15.0 14.2 13.5  HCT 44.0 44.8 42.5  MCV  --  89.1 89.3  PLT  --  278 279   Cardiac Enzymes: No results for input(s): CKTOTAL, CKMB, CKMBINDEX, TROPONINI in the last 168 hours. BNP: BNP (last 3 results) No results for input(s): BNP in the last 8760 hours.  ProBNP (last 3 results) No results for input(s): PROBNP in the last 8760 hours.  CBG: No results for input(s): GLUCAP in the last 168 hours.     Signed:  Meliton Monte MD.  [facility] 04/05/2024, 6:04 PM

## 2024-04-05 NOTE — Progress Notes (Signed)
 RN went over DC instructions with patient and she stated understanding IV has been removed. Medications escribed to home pharmacy.

## 2024-04-05 NOTE — Progress Notes (Addendum)
 STROKE TEAM PROGRESS NOTE    SIGNIFICANT HOSPITAL EVENTS 10/ 5 - Admitted with c/o bilateral hand numbness starting just after 1500. Patient was at church and started feeling poorly. Reported that both of her hands started feeling numb. Reported that after that she noticed that her right hand felt weak. Reported she was having a difficult time gripping things. Reported left hand numbness then went away.  MRI showed acute ischemia within the L MCA region affecting the precentral and postcentral gyri and chronic small vessel disease.  TNK was not warranted given the mild and improving nature of patient's deficits.    INTERIM HISTORY/SUBJECTIVE Today, patient reports she is no longer having right hand weakness, and is able to grip with full strength on physical exam. Reports some numbness at fingertips of right index and thumb. When assessed on physical exam, reports mildly diminished sensation in her fingertips. No other deficits noted on exam. Patient denies past use of blood thinners.   OBJECTIVE  CBC    Component Value Date/Time   WBC 7.4 04/05/2024 0330   RBC 4.76 04/05/2024 0330   HGB 13.5 04/05/2024 0330   HGB 14.5 08/21/2021 0749   HCT 42.5 04/05/2024 0330   HCT 45.0 08/21/2021 0749   PLT 279 04/05/2024 0330   PLT 264 08/21/2021 0749   MCV 89.3 04/05/2024 0330   MCV 86 08/21/2021 0749   MCV 87 07/20/2014 2216   MCH 28.4 04/05/2024 0330   MCHC 31.8 04/05/2024 0330   RDW 12.7 04/05/2024 0330   RDW 12.2 08/21/2021 0749   RDW 13.2 07/20/2014 2216   LYMPHSABS 3.0 04/05/2024 0330   LYMPHSABS 1.7 08/21/2021 0749   LYMPHSABS 3.4 07/20/2014 2216   MONOABS 0.5 04/05/2024 0330   MONOABS 0.7 07/20/2014 2216   EOSABS 0.2 04/05/2024 0330   EOSABS 0.2 08/21/2021 0749   EOSABS 0.4 07/20/2014 2216   BASOSABS 0.0 04/05/2024 0330   BASOSABS 0.0 08/21/2021 0749   BASOSABS 0.1 07/20/2014 2216    BMET    Component Value Date/Time   NA 139 04/05/2024 0330   NA 142 08/21/2021 0749    NA 142 07/20/2014 2216   K 3.8 04/05/2024 0330   K 3.3 (L) 07/20/2014 2216   CL 107 04/05/2024 0330   CL 107 07/20/2014 2216   CO2 21 (L) 04/05/2024 0330   CO2 27 07/20/2014 2216   GLUCOSE 111 (H) 04/05/2024 0330   GLUCOSE 137 (H) 07/20/2014 2216   BUN 10 04/05/2024 0330   BUN 17 08/21/2021 0749   BUN 12 07/20/2014 2216   CREATININE 0.65 04/05/2024 0330   CREATININE 0.90 07/20/2014 2216   CALCIUM  8.9 04/05/2024 0330   CALCIUM  8.8 07/20/2014 2216   EGFR 94 08/21/2021 0749   GFRNONAA >60 04/05/2024 0330   GFRNONAA >60 07/20/2014 2216    IMAGING past 24 hours MR BRAIN WO CONTRAST Result Date: 04/04/2024 EXAM: MRI BRAIN WITHOUT CONTRAST 04/04/2024 07:07:43 PM TECHNIQUE: Multiplanar multisequence MRI of the head/brain was performed without the administration of intravenous contrast. COMPARISON: None available. CLINICAL HISTORY: Right hand weakness and numbness. FINDINGS: BRAIN AND VENTRICLES: Multifocal acute ischemia within the left MCA territory predominantly affecting the precentral and postcentral gyri. Multifocal hyperintense T2-weighted signal within the cerebral white matter, most commonly due to chronic small vessel disease. Mild volume loss. No intracranial hemorrhage. No mass. No midline shift. No hydrocephalus. The sella is unremarkable. Normal flow voids. ORBITS: No acute abnormality. SINUSES AND MASTOIDS: Chronic pansinusitis. BONES AND SOFT TISSUES: Normal marrow signal. No acute  soft tissue abnormality. IMPRESSION: 1. Multifocal acute ischemia within the left MCA territory predominantly affecting the precentral and postcentral gyri. 2. Multifocal hyperintense T2-weighted signal within the cerebral white matter, most commonly due to chronic small vessel disease. 3. Mild volume loss. 4. Chronic pansinusitis. Electronically signed by: Franky Stanford MD 04/04/2024 07:52 PM EDT RP Workstation: [id number]   CT ANGIO HEAD NECK W WO CM Result Date: 04/04/2024 CLINICAL DATA:  Provided  history: Neuro deficit, acute, stroke suspected. Right hand weakness and numbness. EXAM: CT ANGIOGRAPHY HEAD AND NECK WITH AND WITHOUT CONTRAST TECHNIQUE: Multidetector CT imaging of the head and neck was performed using the standard protocol during bolus administration of intravenous contrast. Multiplanar CT image reconstructions and MIPs were obtained to evaluate the vascular anatomy. Carotid stenosis measurements (when applicable) are obtained utilizing NASCET criteria, using the distal internal carotid diameter as the denominator. RADIATION DOSE REDUCTION: This exam was performed according to the departmental dose-optimization program which includes automated exposure control, adjustment of the mA and/or kV according to patient size and/or use of iterative reconstruction technique. CONTRAST:  75mL OMNIPAQUE  IOHEXOL  350 MG/ML SOLN COMPARISON:  Same-day brain MRI 04/04/2024. Head CT 01/27/2024. CT angiogram head 07/21/2014. FINDINGS: CT HEAD FINDINGS Brain: No age-advanced or lobar predominant cerebral atrophy. Patchy acute (predominantly cortical) MCA territory infarcts within the left frontal and parietal lobes, better appreciated on the same-day brain MRI. Notable involvement of the pre and postcentral gyri. Small chronic cortical infarct within the left parietal lobe. Chronic lacunar infarct within the left corona radiata. Moderate patchy and ill-defined hypoattenuation elsewhere within the cerebral white matter, nonspecific but compatible with chronic small vessel ischemic disease. There is no acute intracranial hemorrhage. No extra-axial fluid collection. No evidence of an intracranial mass. No midline shift. Vascular: No hyperdense vessel.  Atherosclerotic calcifications. Skull: No calvarial fracture or aggressive osseous lesion. Sinuses/Orbits: No orbital mass or acute orbital finding. Postsurgical appearance of the paranasal sinuses. Near complete opacification of the right frontal sinus. Extensive  opacification of the left frontal sinus. Mild bilateral ethmoid sinus disease. Severe right sphenoid sinusitis. Severe left sphenoid sinusitis (with complete sinus opacification). Severe right maxillary sinusitis. Mild mucosal thickening within the left maxillary sinus. Review of the MIP images confirms the above findings CTA NECK FINDINGS Aortic arch: Common origin of the innominate and left common carotid arteries. Atherosclerotic plaque within the aortic arch and proximal major branch vessels of the neck. Streak/beam hardening artifact arising from a dense contrast bolus partially obscures the left subclavian artery. Within this limitation, there is no appreciable hemodynamically significant innominate or proximal subclavian artery stenosis. Right carotid system: CCA and ICA patent within the neck without measurable stenosis. Atherosclerotic plaque within the mid and distal common carotid artery, about the carotid bifurcation and within the proximal ICA. Partially retropharyngeal course of the cervical ICA. Left carotid system: CCA and ICA patent within the neck without measurable stenosis. Atherosclerotic plaque about the carotid bifurcation and within the proximal ICA. Vertebral arteries: Codominant patent within the neck. Severe atherosclerotic narrowing at the right vertebral artery origin. Skeleton: Nonspecific reversal of the expected cervical lordosis. Mild grade 1 anterolisthesis at C4-C5 and C7-T1. Cervical spondylosis. Other neck: No neck mass or cervical lymphadenopathy. Upper chest: No consolidation within the imaged lung apices. Review of the MIP images confirms the above findings CTA HEAD FINDINGS Anterior circulation: The intracranial internal carotid arteries are patent. Atherosclerotic plaque within both vessels with no more than mild stenosis. The M1 middle cerebral arteries are patent. Atherosclerotic irregularity of the  M2 and more distal MCA branches bilaterally. Most notably, there is a  severe stenosis within a mid-to-distal left MCA branch (series 18, image 30). No M2 proximal branch occlusion is identified. The anterior cerebral arteries are patent. No intracranial aneurysm is identified. Posterior circulation: The intracranial vertebral arteries are patent. The basilar artery is patent. The posterior cerebral arteries are patent. Atherosclerotic irregularity of both vessels most notably as follows. Moderate-to-severe stenosis within the right posterior cerebral artery at the P2/P3 junction (series 16, image 19). Severe stenosis within the left posterior cerebral artery at the P4 segment level (series 16, image 19). Hypoplastic right P1 segment with sizable right posterior communicating artery. The left posterior communicating artery is diminutive or absent. Venous sinuses: Within the limitations of contrast timing, no convincing thrombus. Anatomic variants: As described Review of the MIP images confirms the above findings IMPRESSION: Non-contrast head CT: 1. Patchy acute (predominantly cortical) middle cerebral artery territory infarcts within the left frontal and parietal lobes, better delineated on the same-day brain MRI. Notable involvement of the pre and postcentral gyri. 2. Background chronic small vessel ischemic disease and chronic infarcts, as described. 3. Extensive paranasal sinus disease as outlined. CTA neck: 1. The common carotid and internal carotid arteries are patent in the neck without stenosis. Atherosclerotic plaque bilaterally, as described. 2. Vertebral arteries patent within the neck. Severe atherosclerotic narrowing of the right vertebral artery origin. 3. Aortic Atherosclerosis (ICD10-I70.0). CTA head: 1. No proximal intracranial large vessel occlusion is identified. 2. Intracranial atherosclerotic disease with multifocal stenoses, most notably as follows. 3. Severe stenosis within a mid-to-distal left middle cerebral artery branch. 4. Moderate-to-severe stenosis within  the right posterior cerebral artery at the P2/P3 junction. 5. Severe stenosis within the left posterior cerebral artery at the P4 segment level. Electronically Signed   By: Rockey Childs D.O.   On: 04/04/2024 19:47    Vitals:   04/05/24 0630 04/05/24 0800 04/05/24 0946 04/05/24 1000  BP:  (!) 153/81 (!) 170/100 (!) 163/79  Pulse:  62 66 65  Resp:  18 16 16   Temp: 97.9 F (36.6 C) 98 F (36.7 C) 98 F (36.7 C) 98.1 F (36.7 C)  TempSrc: Oral Oral Oral Oral  SpO2:  98% 98% 98%  Weight:      Height:         PHYSICAL EXAM General:  Alert, well-nourished, well-developed patient in no acute distress Psych:  Mood and affect appropriate for situation CV: Regular rate and rhythm on monitor Respiratory: Regular, unlabored respirations on room air.   NEURO:  Mental Status: AA&Ox3, patient is able to give clear and coherent history Speech/Language: speech is without dysarthria or aphasia.  Naming, repetition, fluency, and comprehension intact.  Cranial Nerves:  II: PERRL. Visual fields full.  III, IV, VI: EOMI. Eyelids elevate symmetrically.  V: Sensation is intact to light touch and symmetrical to face.  VII: Face is symmetrical resting and smiling VIII: hearing intact to voice. IX, X: Palate elevates symmetrically. Phonation is normal.  XI: Shoulder shrug 5/5. XII: tongue is midline without fasciculations. Motor: 5/5 strength to all muscle groups tested.  Tone: is normal and bulk is normal Sensation - Diminished sensation in fingertips of right thumb and index finger, otherwise no other sensory deficits. Extinction absent to light touch to DSS.   Coordination: FTN intact bilaterally, HKS: no ataxia in BLE. No drift.  Gait- deferred  Most Recent NIH 1   ASSESSMENT/PLAN  [name] is a 68 y.o. female with history  of HTN, HLD, asthma admitted for transient bilateral hand numbness and R hand weakness. R hand weakness has resolved, but patient still with diminished  sensation in fingertips of right thumb and index finger. Patient found to have multiple small L MCA strokes on MRI of undetermined etiology, possibly cardioembolic. Will follow-up on echo results. Recommend further monitoring for Afib with 30-day heart monitor after discharge. NIH on Admission 1.   Acute Ischemic Infarct: left MCA territory Etiology:  Embolic cryptogenic source Code Stroke CT head no acute abnormality, multilevel degenerative disc disease and facet arthrosis resulting in multilevel neuroforaminal narrowing, severe on the right at C3-4 and the left at C4-5. ASPECTS 10.   CTA head & neck: No proximal intracranial LVO is identified. Severe stenosis within a mid-to-distal left middle cerebral artery branch.  Moderate-to-severe stenosis within the right posterior cerebral artery at the P2/P3 junction. Severe stenosis within the left posterior cerebral artery at the P4 segment level. MRI: Multifocal acute ischemia within the left MCA territory predominantly affecting the precentral and postcentral gyri.  2D Echo pending results LDL 123 HgbA1c 5.4 VTE prophylaxis - SCDs No antithrombotic use prior to admission.  Plan aspirin and Plavix for 3 weeks followed by aspirin alone. Therapy recommendations:  No follow up needed  Disposition:  Home  Hx of Stroke/TIA Patient denies previous hx of stroke.   Atrial fibrillation Home Meds: None Continue telemetry monitoring Will follow up with echo and 30-day heart monitor to assess for  A-fib,    Hypertension Home meds: hold metoprolol  tartrate 25 mg BID Stable Blood Pressure Goal: BP less than 220/110   Hyperlipidemia Home meds: Ezetimibe 10 mg daily, resumed in hospital. Patient reports history of prior statin intolerance, but has never been on rosuvastatin previously. LDL 123, goal < 70 Add rosuvastatin 10 mg daily  Continue statin at discharge  Blood Glucose - Controlled Home meds:  None HgbA1c 5.4, goal < 7.0  Other Stroke  Risk Factors Obesity, Body mass index is 32.25 kg/m., BMI >/= 30 associated with increased stroke risk, recommend weight loss, diet and exercise as appropriate  Family hx stroke (Grandmother) Mild coronary artery disease  Other Active Problems Hypokalemia - per primary team  Hospital day # 0  I have personally obtained history,examined this patient, reviewed notes, independently viewed imaging studies, participated in medical decision making and plan of care.ROS completed by me personally and pertinent positives fully documented  I have made any additions or clarifications directly to the above note. Agree with note above.  Patient presented with sudden onset of right hand numbness and weakness due to embolic left parietal MCA branch infarct with CT angiogram showing distal left MCA branch occlusion.  Etiology likely cryptogenic.  Continue ongoing stroke workup.  Telemetry monitoring and 2D echo.  Will need outpatient prolonged cardiac monitoring at discharge to look for paroxysmal A-fib.  Recommend aspirin and Plavix for 3 weeks followed by aspirin alone and aggressive risk factor modification.  Statin for elevated lipids.  Therapy consults.  Long discussion patient and daughter and pastor at the bedside and answered questions. Patient may also consider possible participation in [place]  stroke prevention study.  Patient and daughter were given written information to review and decide.  Was made clear study participation voluntary patient can withdraw from the study at any point if not satisfied.  Patient will get the same excellent medical care irrespective of whether patient participates in the study or not.   I personally spent a total of 50 minutes  in the care of the patient today including getting/reviewing separately obtained history, performing a medically appropriate exam/evaluation, counseling and educating, placing orders, referring and communicating with other health care professionals,  documenting clinical information in the EHR, independently interpreting results, and coordinating care.        Eather Popp, MD Medical Director [facility] Pager: [phone] 04/05/2024 2:08 PM   To contact Stroke Continuity provider, please refer to [email or web address]. After hours, contact General Neurology

## 2024-04-05 NOTE — ED Notes (Signed)
 Patient ambulated to the restroom without difficulty for a one time non volume urine occurrence.

## 2024-04-05 NOTE — Evaluation (Signed)
 Physical Therapy Evaluation Patient Details Name: Barbara Kaiser MRN: [id number] DOB: [birth year] Today's Date: 04/05/2024  History of Present Illness  Pt is 68 yo presenting to [facility] on 10/5 due to acute ischemic L MCA stroke after presenting with RUE weakness. PMH: HTN, hyperlipidemia, asthma.  Clinical Impression  Pt is currently presenting at Min A to get out of the stretching; most likely would do better out a bed. Able to pull herself up with mIn A stabilization on stretcher. Mod I for sit to stand and gait without an AD. Pt with antalgic gait due to previous injury from 2 weeks ago. Due to pt current functional status, home set up and available assistance at home recommending skilled physical therapy services 3x/week in order to address strength, balance and functional mobility to decrease risk for falls, injury and re-hospitalization. Currently pt is presenting close to baseline level of functioning and no skilled physical therapy services recommended in acute care setting. Pt will be discharged from skilled physical therapy services at this time; please re-consult if further needs arise.            If plan is discharge home, recommend the following: Assist for transportation;Assistance with cooking/housework;Other (comment) (as needed)     Equipment Recommendations None recommended by PT     Functional Status Assessment Patient has had a recent decline in their functional status and demonstrates the ability to make significant improvements in function in a reasonable and predictable amount of time.     Precautions / Restrictions Precautions Precautions: Fall Recall of Precautions/Restrictions: Intact Restrictions Weight Bearing Restrictions Per Provider Order: No      Mobility  Bed Mobility Overal bed mobility: Needs Assistance Bed Mobility: Supine to Sit, Sit to Supine     Supine to sit: Min assist Sit to supine: Min assist   General bed mobility comments: Min A for trunk  to mid line and rolling on the stretcher. Min A for getting LE up to stretcher. Most likely could move better on regular bed.    Transfers Overall transfer level: Modified independent Equipment used: None     General transfer comment: Steady on standing without an AD    Ambulation/Gait Ambulation/Gait assistance: Modified independent (Device/Increase time) Gait Distance (Feet): 250 Feet Assistive device: None Gait Pattern/deviations: Step-through pattern, Decreased stance time - right, Antalgic Gait velocity: decreased Gait velocity interpretation: 1.31 - 2.62 ft/sec, indicative of limited community ambulator   General Gait Details: Significant antalgic gait initially that moderately improved with distance. Pt states this has been going on since an injury 2 weeks ago when she fell. She feels tightness/stretching in the R calf.  Stairs Stairs:  (demonstrates good strength with sitting/standing in order to navigate stairs per home set up)             Balance Overall balance assessment: Modified Independent         Pertinent Vitals/Pain Pain Assessment Pain Assessment: No/denies pain    Home Living Family/patient expects to be discharged to:: Private residence Living Arrangements: Spouse/significant other;Children Available Help at Discharge: Family;Available 24 hours/day Type of Home: House Home Access: Stairs to enter   [employer] of Steps: 3 at side with no rail and 8 at rails at front; pt goes through side   Home Layout: One level Home Equipment: Agricultural consultant (2 wheels);BSC/3in1      Prior Function Prior Level of Function : Independent/Modified Independent;Working/employed;Driving             Mobility Comments: 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     Extremity/Trunk Assessment   Upper Extremity Assessment Upper Extremity Assessment: Defer to OT evaluation    Lower Extremity  Assessment Lower Extremity Assessment: Overall WFL for tasks assessed    Cervical / Trunk Assessment Cervical / Trunk Assessment: Normal  Communication   Communication Communication: No apparent difficulties    Cognition Arousal: Alert Behavior During Therapy: WFL for tasks assessed/performed   PT - Cognitive impairments: No apparent impairments       Following commands: Intact       Cueing Cueing Techniques: Verbal cues     General Comments General comments (skin integrity, edema, etc.): No signs/symptoms of cardiac/respiratory distress during session. Pt demonstrates no deviations with heel/shin slides, toe taps. Pt has difficulty with opposition on the R hand but overall demonstrates no deviations with ramps.        Assessment/Plan    PT Assessment All further PT needs can be met in the next venue of care  PT Problem List Decreased mobility;Pain           PT Goals (Current goals can be found in the Care Plan section)  Acute Rehab PT Goals PT Goal Formulation: All assessment and education complete, DC therapy     AM-PAC PT 6 Clicks Mobility  Outcome Measure Help needed turning from your back to your side while in a flat bed without using bedrails?: A Little Help needed moving from lying on your back to sitting on the side of a flat bed without using bedrails?: A Little Help needed moving to and from a bed to a chair (including a wheelchair)?: None Help needed standing up from a chair using your arms (e.g., wheelchair or bedside chair)?: None Help needed to walk in hospital room?: None Help needed climbing 3-5 steps with a railing? : A Little 6 Click Score: 21    End of Session Equipment Utilized During Treatment: Gait belt Activity Tolerance: Patient tolerated treatment well Patient left: in bed;with call bell/phone within reach Nurse Communication: Mobility status PT Visit Diagnosis: Other abnormalities of gait and mobility (R26.89)    Time:  8993-8975 PT Time Calculation (min) (ACUTE ONLY): 18 min   Charges:   PT Evaluation $PT Eval Low Complexity: 1 Low   PT General Charges $$ ACUTE PT VISIT: 1 Visit        Dorothyann Maier, DPT, CLT  Acute Rehabilitation Services Office: [phone] (Secure chat preferred)   Dorothyann VEAR Maier 04/05/2024, 11:31 AM

## 2024-04-05 NOTE — ED Notes (Signed)
 Introduced self to patient at this time. Patient is resting in bed with visible chest rise and fall. The call light is in reach. There are no further requests at this time.

## 2024-04-06 ENCOUNTER — Encounter (HOSPITAL_COMMUNITY): Payer: Self-pay

## 2024-04-06 ENCOUNTER — Emergency Department (HOSPITAL_COMMUNITY)

## 2024-04-06 ENCOUNTER — Ambulatory Visit: Admitting: Occupational Therapy

## 2024-04-06 ENCOUNTER — Ambulatory Visit: Admitting: Physical Therapy

## 2024-04-06 ENCOUNTER — Other Ambulatory Visit: Payer: Self-pay

## 2024-04-06 ENCOUNTER — Observation Stay (HOSPITAL_COMMUNITY)
Admission: EM | Admit: 2024-04-06 | Discharge: 2024-04-08 | Disposition: A | Discharge status: Home / Self Care | Attending: Internal Medicine | Admitting: Internal Medicine

## 2024-04-06 DIAGNOSIS — Z79899 Other long term (current) drug therapy: Secondary | ICD-10-CM | POA: Diagnosis not present

## 2024-04-06 DIAGNOSIS — Z7982 Long term (current) use of aspirin: Secondary | ICD-10-CM | POA: Insufficient documentation

## 2024-04-06 DIAGNOSIS — E785 Hyperlipidemia, unspecified: Secondary | ICD-10-CM | POA: Insufficient documentation

## 2024-04-06 DIAGNOSIS — J45909 Unspecified asthma, uncomplicated: Secondary | ICD-10-CM | POA: Insufficient documentation

## 2024-04-06 DIAGNOSIS — R202 Paresthesia of skin: Secondary | ICD-10-CM | POA: Diagnosis present

## 2024-04-06 DIAGNOSIS — R200 Anesthesia of skin: Principal | ICD-10-CM

## 2024-04-06 DIAGNOSIS — E782 Mixed hyperlipidemia: Secondary | ICD-10-CM

## 2024-04-06 DIAGNOSIS — Z87891 Personal history of nicotine dependence: Secondary | ICD-10-CM | POA: Diagnosis not present

## 2024-04-06 DIAGNOSIS — I2510 Atherosclerotic heart disease of native coronary artery without angina pectoris: Secondary | ICD-10-CM | POA: Insufficient documentation

## 2024-04-06 DIAGNOSIS — Z7902 Long term (current) use of antithrombotics/antiplatelets: Secondary | ICD-10-CM | POA: Insufficient documentation

## 2024-04-06 DIAGNOSIS — I639 Cerebral infarction, unspecified: Principal | ICD-10-CM | POA: Insufficient documentation

## 2024-04-06 DIAGNOSIS — I10 Essential (primary) hypertension: Secondary | ICD-10-CM | POA: Insufficient documentation

## 2024-04-06 LAB — I-STAT CHEM 8, ED
BUN: 11 mg/dL (ref 8–23)
Calcium, Ion: 1.23 mmol/L (ref 1.15–1.40)
Chloride: 106 mmol/L (ref 98–111)
Creatinine, Ser: 0.7 mg/dL (ref 0.44–1.00)
Glucose, Bld: 101 mg/dL — ABNORMAL HIGH (ref 70–99)
HCT: 43 % (ref 36.0–46.0)
Hemoglobin: 14.6 g/dL (ref 12.0–15.0)
Potassium: 3.7 mmol/L (ref 3.5–5.1)
Sodium: 142 mmol/L (ref 135–145)
TCO2: 23 mmol/L (ref 22–32)

## 2024-04-06 LAB — RAPID URINE DRUG SCREEN, HOSP PERFORMED
Amphetamines: NOT DETECTED
Barbiturates: NOT DETECTED
Benzodiazepines: NOT DETECTED
Cocaine: NOT DETECTED
Opiates: NOT DETECTED
Tetrahydrocannabinol: NOT DETECTED

## 2024-04-06 LAB — COMPREHENSIVE METABOLIC PANEL WITH GFR
ALT: 14 U/L (ref 0–44)
AST: 17 U/L (ref 15–41)
Albumin: 3.7 g/dL (ref 3.5–5.0)
Alkaline Phosphatase: 84 U/L (ref 38–126)
Anion gap: 12 (ref 5–15)
BUN: 10 mg/dL (ref 8–23)
CO2: 21 mmol/L — ABNORMAL LOW (ref 22–32)
Calcium: 9.3 mg/dL (ref 8.9–10.3)
Chloride: 106 mmol/L (ref 98–111)
Creatinine, Ser: 0.66 mg/dL (ref 0.44–1.00)
GFR, Estimated: >60 mL/min (ref >60)
Glucose, Bld: 102 mg/dL — ABNORMAL HIGH (ref 70–99)
Potassium: 3.7 mmol/L (ref 3.5–5.1)
Sodium: 139 mmol/L (ref 135–145)
Total Bilirubin: 0.7 mg/dL (ref 0.0–1.2)
Total Protein: 6.6 g/dL (ref 6.5–8.1)

## 2024-04-06 LAB — DIFFERENTIAL
Abs Immature Granulocytes: 0.02 K/uL (ref 0.00–0.07)
Basophils Absolute: 0.1 K/uL (ref 0.0–0.1)
Basophils Relative: 1 %
Eosinophils Absolute: 0.2 K/uL (ref 0.0–0.5)
Eosinophils Relative: 3 %
Immature Granulocytes: 0 %
Lymphocytes Relative: 28 %
Lymphs Abs: 1.9 K/uL (ref 0.7–4.0)
Monocytes Absolute: 0.5 K/uL (ref 0.1–1.0)
Monocytes Relative: 7 %
Neutro Abs: 4 K/uL (ref 1.7–7.7)
Neutrophils Relative %: 61 %

## 2024-04-06 LAB — CBC
HCT: 44.8 % (ref 36.0–46.0)
Hemoglobin: 14.5 g/dL (ref 12.0–15.0)
MCH: 28.5 pg (ref 26.0–34.0)
MCHC: 32.4 g/dL (ref 30.0–36.0)
MCV: 88 fL (ref 80.0–100.0)
Platelets: 283 K/uL (ref 150–400)
RBC: 5.09 MIL/uL (ref 3.87–5.11)
RDW: 12.5 % (ref 11.5–15.5)
WBC: 6.6 K/uL (ref 4.0–10.5)
nRBC: 0 % (ref 0.0–0.2)

## 2024-04-06 LAB — ETHANOL: Alcohol, Ethyl (B): <15 mg/dL (ref <15)

## 2024-04-06 LAB — PROTIME-INR
INR: 1 (ref 0.8–1.2)
Prothrombin Time: 13.8 s (ref 11.4–15.2)

## 2024-04-06 LAB — CBG MONITORING, ED: Glucose-Capillary: 85 mg/dL (ref 70–99)

## 2024-04-06 LAB — APTT: aPTT: 35 s (ref 24–36)

## 2024-04-06 MED ORDER — MIDAZOLAM HCL 2 MG/2ML IJ SOLN
1.0000 mg | Freq: Once | INTRAMUSCULAR | Status: AC | PRN
  Administered 2024-04-06: 1 mg via INTRAVENOUS
  Filled 2024-04-06: qty 2

## 2024-04-06 MED ORDER — ACETAMINOPHEN 325 MG PO TABS: 650.0000 mg | ORAL_TABLET | ORAL | Status: DC | PRN

## 2024-04-06 MED ORDER — ASPIRIN 81 MG PO TBEC
81.0000 mg | DELAYED_RELEASE_TABLET | Freq: Every day | ORAL | Status: DC
  Administered 2024-04-07 – 2024-04-08 (×2): 81 mg via ORAL
  Filled 2024-04-06 (×2): qty 1

## 2024-04-06 MED ORDER — SENNOSIDES-DOCUSATE SODIUM 8.6-50 MG PO TABS: 1.0000 | ORAL_TABLET | Freq: Every evening | ORAL | Status: DC | PRN

## 2024-04-06 MED ORDER — ENOXAPARIN SODIUM 40 MG/0.4ML IJ SOSY
40.0000 mg | PREFILLED_SYRINGE | Freq: Every day | INTRAMUSCULAR | Status: DC
  Administered 2024-04-07 – 2024-04-08 (×2): 40 mg via SUBCUTANEOUS
  Filled 2024-04-06 (×2): qty 0.4

## 2024-04-06 MED ORDER — METHOCARBAMOL 500 MG PO TABS: 500.0000 mg | ORAL_TABLET | Freq: Three times a day (TID) | ORAL | Status: DC | PRN

## 2024-04-06 MED ORDER — ACETAMINOPHEN 650 MG RE SUPP: 650.0000 mg | RECTAL | Status: DC | PRN

## 2024-04-06 MED ORDER — LABETALOL HCL 5 MG/ML IV SOLN: 10.0000 mg | INTRAVENOUS | Status: DC | PRN

## 2024-04-06 MED ORDER — STROKE: EARLY STAGES OF RECOVERY BOOK
Freq: Once | Status: AC
  Filled 2024-04-06: qty 1

## 2024-04-06 MED ORDER — ROSUVASTATIN CALCIUM 5 MG PO TABS
10.0000 mg | ORAL_TABLET | Freq: Every day | ORAL | Status: DC
  Administered 2024-04-07 – 2024-04-08 (×2): 10 mg via ORAL
  Filled 2024-04-06 (×2): qty 2

## 2024-04-06 MED ORDER — CLOPIDOGREL BISULFATE 75 MG PO TABS
75.0000 mg | ORAL_TABLET | Freq: Every day | ORAL | Status: DC
  Administered 2024-04-08: 75 mg via ORAL
  Filled 2024-04-06 (×2): qty 1

## 2024-04-06 MED ORDER — ACETAMINOPHEN 160 MG/5ML PO SOLN: 650.0000 mg | ORAL | Status: DC | PRN

## 2024-04-06 MED ORDER — SODIUM CHLORIDE 0.9 % IV SOLN: INTRAVENOUS | Status: AC

## 2024-04-06 NOTE — Consult Note (Signed)
 NEUROLOGY CONSULT NOTE   Date of service: April 06, 2024 Patient Name: Barbara Kaiser MRN:  [id number] DOB:  [birth year] Chief Complaint:  Requesting Provider: Ula Prentice SAUNDERS, MD  History of Present Illness   This is a 68 year old woman with a past medical history of hypertension and hyperlipidemia as well as recent left MCA stroke this week who presented via EMS as a code stroke for worsening numbness in her right face and hand.  She has been compliant with dual antiplatelet therapy and is not on anticoagulation.  Last known well was at 1230 she had no neurologic deficits and then had recurrence of numbness in her right face, right tongue, and hand.  She states that she no symptoms at time of discharge from hospital yesterday.  Patient had a stroke scale of 2 for left facial droop and decreased sensation of the right hand.  CT head showed no acute process on personal review.  She was not a candidate for TNK due to recent stroke as well as symptoms being too mild to treat.  CTA was not performed as part of the stroke code because exam was not consistent with LVO.   She was discharged from the stroke service yesterday after presenting with right hand weakness and numbness which had previously resolved.  She was found to have small acute infarcts in the left MCA territory that was felt to be due to an embolic cryptogenic source.  TTE at that time showed no intracardiac clot.  Plan was for outpatient cardiac monitoring.  LKW: 1230 Modified rankin score: 1-No significant post stroke disability and can perform usual duties with stroke symptoms IV Thrombolysis: no see above  NIHSS = 2 facial droop and R hand numbness    ROS   Comprehensive ROS performed and pertinent positives documented in HPI   Past History   Past Medical History:  Diagnosis Date   Allergic rhinitis    Anemia, iron deficiency    Colon polyps 3/10   Complication of anesthesia    hard to wake   Hypertension    PONV  (postoperative nausea and vomiting)    Uterine fibroid    with heavy menses and anemia (resolved by ablation)    Past Surgical History:  Procedure Laterality Date   ABDOMINAL HYSTERECTOMY     CARDIAC CATHETERIZATION  05/2005   mild CAD   DILATION AND CURETTAGE OF UTERUS  12/08   hysteroscopy and endometrial ablation   ETHMOIDECTOMY Bilateral 06/18/2019   Procedure: ETHMOIDECTOMY;  Surgeon: Karis Clunes, MD;  Location: [facility];  Service: ENT;  Laterality: Bilateral;   FRONTAL SINUS EXPLORATION Bilateral 06/18/2019   Procedure: FRONTAL SINUS EXPLORATION;  Surgeon: Karis Clunes, MD;  Location: [facility];  Service: ENT;  Laterality: Bilateral;   LAPAROSCOPIC SUPRACERVICAL HYSTERECTOMY  12/09/2011   Procedure: LAPAROSCOPIC SUPRACERVICAL HYSTERECTOMY;  Surgeon: Gloris DELENA Hugger, MD;  Location: [facility];  Service: Gynecology;  Laterality: N/A;   MAXILLARY ANTROSTOMY Bilateral 06/18/2019   Procedure: MAXILLARY ANTROSTOMY;  Surgeon: Karis Clunes, MD;  Location: [facility];  Service: ENT;  Laterality: Bilateral;   NASAL SINUS SURGERY  11/07   SALPINGOOPHORECTOMY  12/09/2011   Procedure: SALPINGO OOPHERECTOMY;  Surgeon: Gloris DELENA Hugger, MD;  Location: [facility];  Service: Gynecology;  Laterality: Bilateral;   SINUS ENDO WITH FUSION Bilateral 06/18/2019   Procedure: SINUS ENDO WITH FUSION;  Surgeon: Karis Clunes, MD;  Location: [facility];  Service: ENT;  Laterality: Bilateral;   SPHENOIDECTOMY  Bilateral 06/18/2019   Procedure: SPHENOIDECTOMY WITH TISSUE REMOVAL;  Surgeon: Karis Clunes, MD;  Location: [facility];  Service: ENT;  Laterality: Bilateral;   SUPRACERVICAL ABDOMINAL HYSTERECTOMY  12/09/2011   Procedure: HYSTERECTOMY SUPRACERVICAL ABDOMINAL;  Surgeon: Gloris DELENA Hugger, MD;  Location: [facility];  Service: Gynecology;  Laterality: N/A;   TUBAL LIGATION     TURBINATE REDUCTION Bilateral 06/18/2019   Procedure: TURBINATE REDUCTION;  Surgeon:  Karis Clunes, MD;  Location: [facility];  Service: ENT;  Laterality: Bilateral;    Family History: Family History  Problem Relation Age of Onset   Hypertension Mother    Cancer Mother 55       uterine   Hypertension Father    Heart disease Other        MI   Diabetes Other    Stroke Other    Heart disease Brother        age 16 and 15   Cancer Other        lung   Breast cancer Neg Hx     Social History  reports that she quit smoking about 22 years ago. Her smoking use included cigarettes. She started smoking about 46 years ago. She has a 6 pack-year smoking history. She has never used smokeless tobacco. She reports that she does not drink alcohol and does not use drugs.  No Known Allergies  Medications   Current Facility-Administered Medications:    midazolam  (VERSED ) injection 1 mg, 1 mg, Intravenous, Once PRN, Floyd, Dan, DO  Current Outpatient Medications:    aspirin EC 81 MG tablet, Take 1 tablet (81 mg total) by mouth daily. Swallow whole., Disp: 90 tablet, Rfl: 0   clopidogrel (PLAVIX) 75 MG tablet, Take 1 tablet (75 mg total) by mouth daily for 21 days., Disp: 21 tablet, Rfl: 0   ezetimibe (ZETIA) 10 MG tablet, Take 1 tablet by mouth daily., Disp: , Rfl:    fluticasone  (FLONASE ) 50 MCG/ACT nasal spray, Place 2 sprays into both nostrils daily. (Patient taking differently: Place 2 sprays into both nostrils daily as needed for allergies.), Disp: 16 g, Rfl: 6   methocarbamol (ROBAXIN) 500 MG tablet, Take 500 mg by mouth as needed for muscle spasms., Disp: , Rfl:    metoprolol  tartrate (LOPRESSOR ) 25 MG tablet, Take 1 tablet (25 mg total) by mouth 2 (two) times daily., Disp: 180 tablet, Rfl: 0   Misc Natural Products (PUMPKIN SEED OIL PO), Take 1 tablet by mouth 1 day or 1 dose., Disp: , Rfl:    ondansetron  (ZOFRAN -ODT) 4 MG disintegrating tablet, Take 1 tablet (4 mg total) by mouth every 6 (six) hours as needed for nausea or vomiting., Disp: 20 tablet, Rfl: 0    rosuvastatin (CRESTOR) 10 MG tablet, Take 1 tablet (10 mg total) by mouth daily., Disp: 90 tablet, Rfl: 0  Vitals   Vitals:   04/06/24 1715 04/06/24 1745 04/06/24 1812 04/06/24 1820  BP: (!) 158/82 (!) 165/86 (!) 142/71   Pulse:   67   Resp: 14 14 12    Temp:    98.3 F (36.8 C)  TempSrc:      SpO2:  100% 98%   Weight:      Height:        Body mass index is 32.25 kg/m.   Physical Exam    Gen: patient lying in bed, NAD CV: extremities appear well-perfused Resp: normal WOB  Neurologic exam MS: alert, oriented x4, follows commands Speech: no dysarthria, no aphasia  CN: PERRL, VFF, EOMI, sensation intact, L NLF flattening, hearing intact to voice Motor: 5/5 strength throughout Sensory: R hand numbness, otherwise intact Reflexes: 2+ symm with toes down bilat Coordination: FNF intact bilat Gait: deferred   Labs/Imaging/Neurodiagnostic studies   CBC:  Recent Labs  Lab May 02, 2024 0330 04/06/24 1454 04/06/24 1501  WBC 7.4 6.6  --   NEUTROABS 3.6 4.0  --   HGB 13.5 14.5 14.6  HCT 42.5 44.8 43.0  MCV 89.3 88.0  --   PLT 279 283  --    Basic Metabolic Panel:  Lab Results  Component Value Date   NA 142 04/06/2024   K 3.7 04/06/2024   CO2 21 (L) 04/06/2024   GLUCOSE 101 (H) 04/06/2024   BUN 11 04/06/2024   CREATININE 0.70 04/06/2024   CALCIUM  9.3 04/06/2024   GFRNONAA >60 04/06/2024   GFRAA 99 08/18/2020   Lipid Panel:  Lab Results  Component Value Date   LDLCALC 123 (H) 05/02/2024   HgbA1c:  Lab Results  Component Value Date   HGBA1C 5.4 04/04/2024   Urine Drug Screen:     Component Value Date/Time   LABOPIA NONE DETECTED 04/06/2024 1348   COCAINSCRNUR NONE DETECTED 04/06/2024 1348   LABBENZ NONE DETECTED 04/06/2024 1348   AMPHETMU NONE DETECTED 04/06/2024 1348   THCU NONE DETECTED 04/06/2024 1348   LABBARB NONE DETECTED 04/06/2024 1348    Alcohol Level     Component Value Date/Time   ETH <15 04/06/2024 1454   INR  Lab Results  Component  Value Date   INR 1.0 04/06/2024   APTT  Lab Results  Component Value Date   APTT 35 04/06/2024   AED levels: No results found for: PHENYTOIN, ZONISAMIDE, LAMOTRIGINE, LEVETIRACETA  CT Head without contrast(Personally reviewed): No acute process  ASSESSMENT   This is a 68 year old woman with a past medical history of hypertension and hyperlipidemia as well as recent left MCA stroke this week who presents with worsening numbness in R face and hand. She also appears to have L NLF flattening which was not noted on her discharge exam yesterday. She has not been hypotensive since arrival.  She was just discharged from the stroke service yesterday after presenting with right hand weakness and numbness which had previously resolved.  She was found to have small acute infarcts in the left MCA territory that was felt to be due to an embolic cryptogenic source.  TTE at that time showed no intracardiac clot.  Plan was for outpatient cardiac monitoring.  RECOMMENDATIONS   Recommend MRI brain wo contrast in ED. If she has new strokes not seen on most recent MRI brain 10/5 that is concerning for ongoing emboli from unknown source, possibly cardiac. If this is the case recommend admission for further workup including TEE. Please make sure patient is amenable to TEE before admission since that is the reason for admission. If no new stroke is seen on MRI today, OK to discharge patient home with outpatient follow up and no medication changes.  Please contact the overnight neurohospitalist Dr. Sal Khaliqdina (in [place]) with any questions. ______________________________________________________________________    Signed, Elida CHRISTELLA Ross, MD [facility]

## 2024-04-06 NOTE — ED Provider Notes (Signed)
 [place] EMERGENCY DEPARTMENT AT [facility] Provider Note   CSN: [id number] Arrival date & time: 04/06/24  1345  An emergency department physician performed an initial assessment on this suspected stroke patient at 1348.  Patient presents with: Code Stroke   Barbara Kaiser is a 68 y.o. female.   68 yo F with a chief plaints of numbness to her fingers tongue and reported facial droop.  This occurred about an hour ago.  Patient was made a code stroke.  Level 5 caveat acuity of condition.        Prior to Admission medications   Medication Sig Start Date End Date Taking? Authorizing Provider  aspirin EC 81 MG tablet Take 1 tablet (81 mg total) by mouth daily. Swallow whole. 04/05/24 07/04/24 Yes Perri DELENA Meliton Mickey., MD  clopidogrel (PLAVIX) 75 MG tablet Take 1 tablet (75 mg total) by mouth daily for 21 days. 04/05/24 04/26/24 Yes Perri DELENA Meliton Mickey., MD  ezetimibe (ZETIA) 10 MG tablet Take 1 tablet by mouth daily. 11/11/23 11/10/24 Yes [provider]  fluticasone  (FLONASE ) 50 MCG/ACT nasal spray Place 2 sprays into both nostrils daily. Patient taking differently: Place 2 sprays into both nostrils daily as needed for allergies. 07/10/22  Yes Scarboro, Adam J, NP  methocarbamol (ROBAXIN) 500 MG tablet Take 500 mg by mouth as needed for muscle spasms.   Yes [provider]  metoprolol  tartrate (LOPRESSOR ) 25 MG tablet Take 1 tablet (25 mg total) by mouth 2 (two) times daily. 06/13/22  Yes Simmons-Robinson, Makiera, MD  Misc Natural Products (PUMPKIN SEED OIL PO) Take 1 tablet by mouth 1 day or 1 dose.   Yes [provider]  ondansetron  (ZOFRAN -ODT) 4 MG disintegrating tablet Take 1 tablet (4 mg total) by mouth every 6 (six) hours as needed for nausea or vomiting. 01/27/24  Yes Ward, Josette N, DO  rosuvastatin (CRESTOR) 10 MG tablet Take 1 tablet (10 mg total) by mouth daily. 04/06/24 05/06/24 Yes Perri DELENA Meliton Mickey., MD  docusate sodium  (COLACE) 100  MG capsule Take 1 capsule (100 mg total) by mouth 2 (two) times daily. Patient not taking: Reported on 04/06/2024 02/22/22   Mavis Purchase, MD  HYDROcodone -acetaminophen  (NORCO/VICODIN) 5-325 MG tablet Take 2 tablets by mouth every 8 (eight) hours as needed. Patient not taking: Reported on 04/04/2024 01/27/24   Ward, Josette SAILOR, DO  loratadine  (CLARITIN ) 10 MG tablet Take 1 tablet (10 mg total) by mouth daily. Patient not taking: Reported on 04/06/2024 08/30/22   Scarboro, Adam J, NP  montelukast  (SINGULAIR ) 10 MG tablet Take 1 tablet (10 mg total) by mouth at bedtime. Patient not taking: Reported on 04/04/2024 12/30/22   Gandhi, Safal, PA-C    Allergies: Patient has no known allergies.    Review of Systems  Updated Vital Signs BP (!) 148/65   Pulse 60   Temp 98 F (36.7 C) (Oral)   Resp 14   Ht 5' 7 (1.702 m)   Wt 93.4 kg   LMP 11/28/2011   SpO2 99%   BMI 32.25 kg/m   Physical Exam Vitals and nursing note reviewed.  Constitutional:      General: She is not in acute distress.    Appearance: She is well-developed. She is not diaphoretic.  HENT:     Head: Normocephalic and atraumatic.  Eyes:     Pupils: Pupils are equal, round, and reactive to light.  Cardiovascular:     Rate and Rhythm: Normal rate and regular rhythm.  Heart sounds: No murmur heard.    No friction rub. No gallop.  Pulmonary:     Effort: Pulmonary effort is normal.     Breath sounds: No wheezing or rales.  Abdominal:     General: There is no distension.     Palpations: Abdomen is soft.     Tenderness: There is no abdominal tenderness.  Musculoskeletal:        General: No tenderness.     Cervical back: Normal range of motion and neck supple.  Skin:    General: Skin is warm and dry.  Neurological:     Mental Status: She is alert and oriented to person, place, and time.  Psychiatric:        Behavior: Behavior normal.     (all labs ordered are listed, but only abnormal results are displayed) Labs  Reviewed  ETHANOL  PROTIME-INR  APTT  CBC  DIFFERENTIAL  COMPREHENSIVE METABOLIC PANEL WITH GFR  RAPID URINE DRUG SCREEN, HOSP PERFORMED  CBG MONITORING, ED  I-STAT CHEM 8, ED    EKG: None  Radiology: CT HEAD CODE STROKE WO CONTRAST Result Date: 04/06/2024 CLINICAL DATA:  Provided history: Code stroke. Neuro deficit, acute, stroke suspected. EXAM: CT HEAD WITHOUT CONTRAST TECHNIQUE: Contiguous axial images were obtained from the base of the skull through the vertex without intravenous contrast. RADIATION DOSE REDUCTION: This exam was performed according to the departmental dose-optimization program which includes automated exposure control, adjustment of the mA and/or kV according to patient size and/or use of iterative reconstruction technique. COMPARISON:  Brain MRI 04/04/2024. Non-contrast head CT and CT angiogram head/neck 04/04/2024.A FINDINGS: Brain: No age-advanced or lobar predominant cerebral atrophy. Known patchy acute left MCA territory infarcts, overall better appreciated on the recent prior brain MRI of 04/04/2024. As before, some of these infarcts involve the pre and postcentral gyri. No CT evidence of an interval acute intracranial abnormality. Small chronic cortical infarct within the left parietal lobe, better appreciated on the prior MRI. Unchanged chronic lacunar infarct within the left corona radiata. Moderate patchy and ill-defined hypoattenuation elsewhere within the cerebral white matter, nonspecific but compatible with chronic small vessel ischemic disease. There is no acute intracranial hemorrhage. No extra-axial fluid collection. No evidence of an intracranial mass. No midline shift. Vascular: No hyperdense vessel.  Atherosclerotic calcifications. Skull: No calvarial fracture or aggressive osseous lesion. Sinuses/Orbits: No orbital mass or acute orbital finding. Pansinusitis, not significant changed at the imaged levels. ASPECTS [facility] Stroke Program Early CT Score) -  Ganglionic level infarction (caudate, lentiform nuclei, internal capsule, insula, M1-M3 cortex): 7 - Supraganglionic infarction (M4-M6 cortex): 1 Total score (0-10 with 10 being normal): 8 No evidence of an interval acute intracranial abnormality. These results were communicated to Dr. Matthews at 2:05 pmon 10/7/2025by text page via the [facility] messaging system. IMPRESSION: 1. Known patchy acute left MCA territory infarcts, overall better appreciated on the recent prior brain MRI of 04/04/2024. 2. No evidence of an interval acute intracranial abnormality 3. Background chronic small vessel ischemic disease and chronic infarcts, as described. Electronically Signed   By: Rockey Childs D.O.   On: 04/06/2024 14:06   ECHOCARDIOGRAM COMPLETE Result Date: 04/05/2024    ECHOCARDIOGRAM REPORT   Patient Name:   Barbara Kaiser Date of Exam: 04/05/2024 Medical Rec #:  [id number]      Height:       67.0 in Accession #:    [id number]     Weight:       205.9 lb Date of Birth:  June 25, 1956      BSA:          2.047 m Patient Age:    67 years       BP:           159/81 mmHg Patient Gender: F              HR:           73 bpm. Exam Location:  Inpatient Procedure: 2D Echo, Cardiac Doppler and Color Doppler (Both Spectral and Color            Flow Doppler were utilized during procedure). Indications:    TIA G45.9  History:        Patient has no prior history of Echocardiogram examinations.                 Risk Factors:Hypertension.  Sonographer:    Jayson Gaskins Referring Phys: [id number] JUSTIN B HOWERTER IMPRESSIONS  1. Left ventricular ejection fraction, by estimation, is 65 to 70%. The left ventricle has normal function. The left ventricle has no regional wall motion abnormalities. There is mild left ventricular hypertrophy. Left ventricular diastolic parameters are consistent with Grade I diastolic dysfunction (impaired relaxation).  2. Right ventricular systolic function is normal. The right ventricular size is normal.  3. The mitral valve  is normal in structure. No evidence of mitral valve regurgitation. No evidence of mitral stenosis.  4. The aortic valve is tricuspid. Aortic valve regurgitation is not visualized. No aortic stenosis is present.  5. The inferior vena cava is normal in size with greater than 50% respiratory variability, suggesting right atrial pressure of 3 mmHg. Conclusion(s)/Recommendation(s): No intracardiac source of embolism detected on this transthoracic study. Consider a transesophageal echocardiogram to exclude cardiac source of embolism if clinically indicated. FINDINGS  Left Ventricle: Left ventricular ejection fraction, by estimation, is 65 to 70%. The left ventricle has normal function. The left ventricle has no regional wall motion abnormalities. The left ventricular internal cavity size was normal in size. There is  mild left ventricular hypertrophy. Left ventricular diastolic parameters are consistent with Grade I diastolic dysfunction (impaired relaxation). Right Ventricle: The right ventricular size is normal. No increase in right ventricular wall thickness. Right ventricular systolic function is normal. Left Atrium: Left atrial size was normal in size. Right Atrium: Right atrial size was normal in size. Pericardium: There is no evidence of pericardial effusion. Mitral Valve: The mitral valve is normal in structure. No evidence of mitral valve regurgitation. No evidence of mitral valve stenosis. Tricuspid Valve: The tricuspid valve is normal in structure. Tricuspid valve regurgitation is not demonstrated. No evidence of tricuspid stenosis. Aortic Valve: The aortic valve is tricuspid. Aortic valve regurgitation is not visualized. No aortic stenosis is present. Aortic valve mean gradient measures 5.0 mmHg. Aortic valve peak gradient measures 7.4 mmHg. Aortic valve area, by VTI measures 3.12 cm. Pulmonic Valve: The pulmonic valve was normal in structure. Pulmonic valve regurgitation is not visualized. No evidence of  pulmonic stenosis. Aorta: The aortic root is normal in size and structure. Venous: The inferior vena cava is normal in size with greater than 50% respiratory variability, suggesting right atrial pressure of 3 mmHg. IAS/Shunts: No atrial level shunt detected by color flow Doppler.  LEFT VENTRICLE PLAX 2D LVIDd:         3.60 cm   Diastology LVIDs:         2.20 cm   LV e' medial:    5.00 cm/s LV PW:  1.10 cm   LV E/e' medial:  9.5 LV IVS:        1.20 cm   LV e' lateral:   8.05 cm/s LVOT diam:     1.90 cm   LV E/e' lateral: 5.9 LV SV:         63 LV SV Index:   31 LVOT Area:     2.84 cm  RIGHT VENTRICLE RV S prime:     16.50 cm/s TAPSE (M-mode): 2.5 cm LEFT ATRIUM             Index        RIGHT ATRIUM           Index LA Vol (A2C):   33.7 ml 16.46 ml/m  RA Area:     18.40 cm LA Vol (A4C):   40.0 ml 19.54 ml/m  RA Volume:   50.50 ml  24.67 ml/m LA Biplane Vol: 40.3 ml 19.68 ml/m  AORTIC VALVE AV Area (Vmax):    2.56 cm AV Area (Vmean):   2.43 cm AV Area (VTI):     3.12 cm AV Vmax:           136.00 cm/s AV Vmean:          102.000 cm/s AV VTI:            0.201 m AV Peak Grad:      7.4 mmHg AV Mean Grad:      5.0 mmHg LVOT Vmax:         123.00 cm/s LVOT Vmean:        87.300 cm/s LVOT VTI:          0.221 m LVOT/AV VTI ratio: 1.10  AORTA Ao Root diam: 2.80 cm MITRAL VALVE MV Area (PHT): 2.51 cm    SHUNTS MV Decel Time: 302 msec    Systemic VTI:  0.22 m MV E velocity: 47.70 cm/s  Systemic Diam: 1.90 cm MV A velocity: 85.40 cm/s MV E/A ratio:  0.56 Oneil Parchment MD Electronically signed by Oneil Parchment MD Signature Date/Time: 04/05/2024/4:37:57 PM    Final    MR BRAIN WO CONTRAST Result Date: 04/04/2024 EXAM: MRI BRAIN WITHOUT CONTRAST 04/04/2024 07:07:43 PM TECHNIQUE: Multiplanar multisequence MRI of the head/brain was performed without the administration of intravenous contrast. COMPARISON: None available. CLINICAL HISTORY: Right hand weakness and numbness. FINDINGS: BRAIN AND VENTRICLES: Multifocal acute  ischemia within the left MCA territory predominantly affecting the precentral and postcentral gyri. Multifocal hyperintense T2-weighted signal within the cerebral white matter, most commonly due to chronic small vessel disease. Mild volume loss. No intracranial hemorrhage. No mass. No midline shift. No hydrocephalus. The sella is unremarkable. Normal flow voids. ORBITS: No acute abnormality. SINUSES AND MASTOIDS: Chronic pansinusitis. BONES AND SOFT TISSUES: Normal marrow signal. No acute soft tissue abnormality. IMPRESSION: 1. Multifocal acute ischemia within the left MCA territory predominantly affecting the precentral and postcentral gyri. 2. Multifocal hyperintense T2-weighted signal within the cerebral white matter, most commonly due to chronic small vessel disease. 3. Mild volume loss. 4. Chronic pansinusitis. Electronically signed by: Franky Stanford MD 04/04/2024 07:52 PM EDT RP Workstation: [id number]   CT ANGIO HEAD NECK W WO CM Result Date: 04/04/2024 CLINICAL DATA:  Provided history: Neuro deficit, acute, stroke suspected. Right hand weakness and numbness. EXAM: CT ANGIOGRAPHY HEAD AND NECK WITH AND WITHOUT CONTRAST TECHNIQUE: Multidetector CT imaging of the head and neck was performed using the standard protocol during bolus administration of intravenous contrast. Multiplanar CT image reconstructions and MIPs  were obtained to evaluate the vascular anatomy. Carotid stenosis measurements (when applicable) are obtained utilizing NASCET criteria, using the distal internal carotid diameter as the denominator. RADIATION DOSE REDUCTION: This exam was performed according to the departmental dose-optimization program which includes automated exposure control, adjustment of the mA and/or kV according to patient size and/or use of iterative reconstruction technique. CONTRAST:  75mL OMNIPAQUE  IOHEXOL  350 MG/ML SOLN COMPARISON:  Same-day brain MRI 04/04/2024. Head CT 01/27/2024. CT angiogram head 07/21/2014.  FINDINGS: CT HEAD FINDINGS Brain: No age-advanced or lobar predominant cerebral atrophy. Patchy acute (predominantly cortical) MCA territory infarcts within the left frontal and parietal lobes, better appreciated on the same-day brain MRI. Notable involvement of the pre and postcentral gyri. Small chronic cortical infarct within the left parietal lobe. Chronic lacunar infarct within the left corona radiata. Moderate patchy and ill-defined hypoattenuation elsewhere within the cerebral white matter, nonspecific but compatible with chronic small vessel ischemic disease. There is no acute intracranial hemorrhage. No extra-axial fluid collection. No evidence of an intracranial mass. No midline shift. Vascular: No hyperdense vessel.  Atherosclerotic calcifications. Skull: No calvarial fracture or aggressive osseous lesion. Sinuses/Orbits: No orbital mass or acute orbital finding. Postsurgical appearance of the paranasal sinuses. Near complete opacification of the right frontal sinus. Extensive opacification of the left frontal sinus. Mild bilateral ethmoid sinus disease. Severe right sphenoid sinusitis. Severe left sphenoid sinusitis (with complete sinus opacification). Severe right maxillary sinusitis. Mild mucosal thickening within the left maxillary sinus. Review of the MIP images confirms the above findings CTA NECK FINDINGS Aortic arch: Common origin of the innominate and left common carotid arteries. Atherosclerotic plaque within the aortic arch and proximal major branch vessels of the neck. Streak/beam hardening artifact arising from a dense contrast bolus partially obscures the left subclavian artery. Within this limitation, there is no appreciable hemodynamically significant innominate or proximal subclavian artery stenosis. Right carotid system: CCA and ICA patent within the neck without measurable stenosis. Atherosclerotic plaque within the mid and distal common carotid artery, about the carotid bifurcation and  within the proximal ICA. Partially retropharyngeal course of the cervical ICA. Left carotid system: CCA and ICA patent within the neck without measurable stenosis. Atherosclerotic plaque about the carotid bifurcation and within the proximal ICA. Vertebral arteries: Codominant patent within the neck. Severe atherosclerotic narrowing at the right vertebral artery origin. Skeleton: Nonspecific reversal of the expected cervical lordosis. Mild grade 1 anterolisthesis at C4-C5 and C7-T1. Cervical spondylosis. Other neck: No neck mass or cervical lymphadenopathy. Upper chest: No consolidation within the imaged lung apices. Review of the MIP images confirms the above findings CTA HEAD FINDINGS Anterior circulation: The intracranial internal carotid arteries are patent. Atherosclerotic plaque within both vessels with no more than mild stenosis. The M1 middle cerebral arteries are patent. Atherosclerotic irregularity of the M2 and more distal MCA branches bilaterally. Most notably, there is a severe stenosis within a mid-to-distal left MCA branch (series 18, image 30). No M2 proximal branch occlusion is identified. The anterior cerebral arteries are patent. No intracranial aneurysm is identified. Posterior circulation: The intracranial vertebral arteries are patent. The basilar artery is patent. The posterior cerebral arteries are patent. Atherosclerotic irregularity of both vessels most notably as follows. Moderate-to-severe stenosis within the right posterior cerebral artery at the P2/P3 junction (series 16, image 19). Severe stenosis within the left posterior cerebral artery at the P4 segment level (series 16, image 19). Hypoplastic right P1 segment with sizable right posterior communicating artery. The left posterior communicating artery is diminutive or absent.  Venous sinuses: Within the limitations of contrast timing, no convincing thrombus. Anatomic variants: As described Review of the MIP images confirms the above  findings IMPRESSION: Non-contrast head CT: 1. Patchy acute (predominantly cortical) middle cerebral artery territory infarcts within the left frontal and parietal lobes, better delineated on the same-day brain MRI. Notable involvement of the pre and postcentral gyri. 2. Background chronic small vessel ischemic disease and chronic infarcts, as described. 3. Extensive paranasal sinus disease as outlined. CTA neck: 1. The common carotid and internal carotid arteries are patent in the neck without stenosis. Atherosclerotic plaque bilaterally, as described. 2. Vertebral arteries patent within the neck. Severe atherosclerotic narrowing of the right vertebral artery origin. 3. Aortic Atherosclerosis (ICD10-I70.0). CTA head: 1. No proximal intracranial large vessel occlusion is identified. 2. Intracranial atherosclerotic disease with multifocal stenoses, most notably as follows. 3. Severe stenosis within a mid-to-distal left middle cerebral artery branch. 4. Moderate-to-severe stenosis within the right posterior cerebral artery at the P2/P3 junction. 5. Severe stenosis within the left posterior cerebral artery at the P4 segment level. Electronically Signed   By: Rockey Childs D.O.   On: 04/04/2024 19:47     Procedures   Medications Ordered in the ED  midazolam  (VERSED ) injection 1 mg (has no administration in time range)                                    Medical Decision Making Amount and/or Complexity of Data Reviewed Labs: ordered. Radiology: ordered.  Risk Prescription drug management.   56 yoF with a chief complaints of numbness to her fingers tongue and facial droop arrived as a code stroke.  Airway cleared at the bridge taken urgently to CT.  Patient's symptoms had resolved upon arrival here.  Record review patient recently in the hospital with a stroke.  Patient seen by neurology plan for repeat MRI.  Concern is if continuing to shower small strokes likely needs a loop recorder.  Even if  positive likely goes home.  Signed out to Dr. Ula, please see their note for further details of care.   The patients results and plan were reviewed and discussed.   Any x-rays performed were independently reviewed by myself.   Differential diagnosis were considered with the presenting HPI.  Medications  midazolam  (VERSED ) injection 1 mg (has no administration in time range)    Vitals:   04/06/24 1400 04/06/24 1445 04/06/24 1445 04/06/24 1500  BP: (!) 194/90 (!) 167/82  (!) 148/65  Pulse:  66  60  Resp:  11  14  Temp:      TempSrc:      SpO2:  99%  99%  Weight:   93.4 kg   Height:   5' 7 (1.702 m)     Final diagnoses:  Right sided numbness         Final diagnoses:  Right sided numbness    ED Discharge Orders     None          Emil Share, DO 04/06/24 1509

## 2024-04-06 NOTE — ED Triage Notes (Signed)
 Pt BIB EMS from home for complaints of right sided numbness including tongue. LKW 1230. Pt was discharged from hospital yesterday.

## 2024-04-06 NOTE — ED Notes (Addendum)
 Pt denies any tingling or numbing sensations at this time. Pt ambulated to restroom and back with a steady gait, no complaints at this time.

## 2024-04-06 NOTE — Code Documentation (Signed)
 Stroke Response Nurse Documentation Code Documentation  Barbara Kaiser is a 68 y.o. female arriving to [facility]  via [facility] on 04/06/2024 with past medical hx of HTN HLD recent L MCA territory stroke. On aspirin 81 mg daily and clopidogrel 75 mg daily. Code stroke was activated by EMS.   Patient from home where she was LKW at 1230 and now complaining of numbness in rt tongue, hand. She states she had no symptoms upon her discharge from hospital yesterday.  Stroke team at the bedside on patient arrival. Labs drawn and patient cleared for CT by Dr. Emil. Patient to CT with team. NIHSS 2, see documentation for details and code stroke times. Patient with left facial droop and right decreased sensation on exam. The following imaging was completed:  CT Head. Patient is not a candidate for IV Thrombolytic due to recent stroke as well as too mild to treat. Patient is not a candidate for IR due to VAN negative..   Care Plan:   No acute treatment/TIA alert: q2h x 12 hours NIHSS & VS, then q4h  NPO until stroke swallow screen passed.      Bedside handoff with ED RN Metta.    Michai Dieppa Livengood  Stroke Response RN

## 2024-04-06 NOTE — H&P (Signed)
 SABRA

## 2024-04-06 NOTE — ED Notes (Signed)
 Patient transported to MRI

## 2024-04-06 NOTE — ED Notes (Signed)
 Pt ambulated to restroom with a stable gait, no complaints at this time

## 2024-04-07 ENCOUNTER — Other Ambulatory Visit: Payer: Self-pay | Admitting: Cardiology

## 2024-04-07 DIAGNOSIS — E785 Hyperlipidemia, unspecified: Secondary | ICD-10-CM

## 2024-04-07 DIAGNOSIS — I639 Cerebral infarction, unspecified: Secondary | ICD-10-CM | POA: Diagnosis not present

## 2024-04-07 DIAGNOSIS — I739 Peripheral vascular disease, unspecified: Secondary | ICD-10-CM | POA: Diagnosis not present

## 2024-04-07 DIAGNOSIS — R29700 NIHSS score 0: Secondary | ICD-10-CM | POA: Diagnosis not present

## 2024-04-07 DIAGNOSIS — I6389 Other cerebral infarction: Secondary | ICD-10-CM

## 2024-04-07 LAB — CBC
HCT: 43.2 % (ref 36.0–46.0)
Hemoglobin: 14.2 g/dL (ref 12.0–15.0)
MCH: 28.9 pg (ref 26.0–34.0)
MCHC: 32.9 g/dL (ref 30.0–36.0)
MCV: 88 fL (ref 80.0–100.0)
Platelets: 300 K/uL (ref 150–400)
RBC: 4.91 MIL/uL (ref 3.87–5.11)
RDW: 12.6 % (ref 11.5–15.5)
WBC: 7.6 K/uL (ref 4.0–10.5)
nRBC: 0 % (ref 0.0–0.2)

## 2024-04-07 LAB — BASIC METABOLIC PANEL WITH GFR
Anion gap: 10 (ref 5–15)
BUN: 12 mg/dL (ref 8–23)
CO2: 23 mmol/L (ref 22–32)
Calcium: 9.1 mg/dL (ref 8.9–10.3)
Chloride: 104 mmol/L (ref 98–111)
Creatinine, Ser: 0.7 mg/dL (ref 0.44–1.00)
GFR, Estimated: >60 mL/min (ref >60)
Glucose, Bld: 106 mg/dL — ABNORMAL HIGH (ref 70–99)
Potassium: 3.3 mmol/L — ABNORMAL LOW (ref 3.5–5.1)
Sodium: 137 mmol/L (ref 135–145)

## 2024-04-07 LAB — HIV ANTIBODY (ROUTINE TESTING W REFLEX): HIV Screen 4th Generation wRfx: NONREACTIVE

## 2024-04-07 MED ORDER — POTASSIUM CHLORIDE 20 MEQ PO PACK
20.0000 meq | PACK | Freq: Once | ORAL | Status: AC
  Administered 2024-04-07: 20 meq via ORAL
  Filled 2024-04-07: qty 1

## 2024-04-07 NOTE — ED Notes (Signed)
 Pt given toiletries and fresh linen to do a bed bath. Family assisted. Call bell within reach. Bed in low and locked position.

## 2024-04-07 NOTE — H&P (View-Only) (Signed)
   [facility] has been requested to perform a transesophageal echocardiogram on Barbara Kaiser for an acute CVA.    The patient passed a swallow study in the ED.   The patient does NOT have any absolute or relative contraindications to a Transesophageal Echocardiogram (TEE).  The patient has: No other conditions that may impact this procedure.   After careful review of history and examination, the risks and benefits of transesophageal echocardiogram have been explained including risks of esophageal damage, perforation (1:10,000 risk), bleeding, pharyngeal hematoma as well as other potential complications associated with conscious sedation including aspiration, arrhythmia, respiratory failure and death. Alternatives to treatment were discussed, questions were answered. Patient is willing to proceed.   SignedMorse Clause, PA-C  04/07/2024 3:17 PM

## 2024-04-07 NOTE — Plan of Care (Signed)
  Problem: Education: Goal: Knowledge of disease or condition will improve Outcome: Progressing   Problem: Coping: Goal: Will verbalize positive feelings about self Outcome: Progressing   Problem: Coping: Goal: Will identify appropriate support needs Outcome: Progressing   Problem: Health Behavior/Discharge Planning: Goal: Ability to manage health-related needs will improve Outcome: Progressing   Problem: Nutrition: Goal: Risk of aspiration will decrease Outcome: Progressing   Problem: Nutrition: Goal: Dietary intake will improve Outcome: Progressing   Problem: Education: Goal: Knowledge of General Education information will improve Description: Including pain rating scale, medication(s)/side effects and non-pharmacologic comfort measures Outcome: Progressing   Problem: Health Behavior/Discharge Planning: Goal: Ability to manage health-related needs will improve Outcome: Progressing   Problem: Clinical Measurements: Goal: Will remain free from infection Outcome: Progressing   Problem: Clinical Measurements: Goal: Respiratory complications will improve Outcome: Progressing   Problem: Clinical Measurements: Goal: Cardiovascular complication will be avoided Outcome: Progressing   Problem: Coping: Goal: Level of anxiety will decrease Outcome: Progressing   Problem: Elimination: Goal: Will not experience complications related to bowel motility Outcome: Progressing   Problem: Elimination: Goal: Will not experience complications related to urinary retention Outcome: Progressing   Problem: Pain Managment: Goal: General experience of comfort will improve and/or be controlled Outcome: Progressing

## 2024-04-07 NOTE — Evaluation (Signed)
 Physical Therapy Evaluation & Discharge Patient Details Name: Barbara Kaiser MRN: [id number] DOB: 05-19-1956 Today's Date: 04/07/2024  History of Present Illness  Pt is a 67 y.o. female who presented 10/7 with R face and hand numbness. Pt recently admitted for L MCA CVA and discharged home 10/6. MRI this admission showed many small acute infarcts in the left MCA territory an acute right basal ganglia perforator infarct. PMH: HTN, hyperlipidemia, asthma.   Clinical Impression  Pt presents with condition above. PTA, she was independent, working, and living with a friend in a 1-level house with 3-8 STE. The pt currently displays deficits in R UE dexterity, which she reported was the case when she discharged home on 10/6. Otherwise, the pt displays WFL, symmetrical, and intact bil lower extremity strength, sensation, and coordination. She displayed some word finding difficulties this session, but reports her speech is at her baseline. Her word finding issues could be due to lack of sleep since admission. She currently mobilizes at an independent level without LOB or need for DME. As pt appears to be at her baseline other than her R UE deficits, no post acute PT is needed and will defer R UE dexterity deficits to OT. All education completed and questions answered. PT will sign off. Thank you for this referral.        If plan is discharge home, recommend the following: Assistance with cooking/housework;Assist for transportation   Can travel by private vehicle        Equipment Recommendations None recommended by PT  Recommendations for Other Services       Functional Status Assessment Patient has had a recent decline in their functional status and demonstrates the ability to make significant improvements in function in a reasonable and predictable amount of time.     Precautions / Restrictions Precautions Precautions: None Restrictions Weight Bearing Restrictions Per Provider Order: No       Mobility  Bed Mobility Overal bed mobility: Modified Independent Bed Mobility: Supine to Sit, Sit to Supine     Supine to sit: Modified independent (Device/Increase time), HOB elevated Sit to supine: Modified independent (Device/Increase time), HOB elevated   General bed mobility comments: HOB elevated, no assistance needed    Transfers Overall transfer level: Independent Equipment used: None               General transfer comment: Pt able to stand from edge of stretcher without LOB or assistance    Ambulation/Gait Ambulation/Gait assistance: Supervision, Independent Gait Distance (Feet): 220 Feet Assistive device: None Gait Pattern/deviations: WFL(Within Functional Limits) Gait velocity: WFL     General Gait Details: No drastic gait deviation. No LOB, even with direction, speed, and head position changes. Supervision initially but progressed to independent  Stairs            Wheelchair Mobility     Tilt Bed    Modified Rankin (Stroke Patients Only) Modified Rankin (Stroke Patients Only) Pre-Morbid Rankin Score: No significant disability Modified Rankin: No significant disability     Balance Overall balance assessment: Independent                                           Pertinent Vitals/Pain Pain Assessment Pain Assessment: Faces Faces Pain Scale: Hurts a little bit Pain Location: R posterior lower leg from prior injury (hit it against tub during fall in August) (pain went away with  walking) Pain Descriptors / Indicators: Discomfort, Sore Pain Intervention(s): Limited activity within patient's tolerance, Monitored during session, Repositioned    Home Living Family/patient expects to be discharged to:: Private residence Living Arrangements: Non-relatives/Friends Available Help at Discharge: Family;Available 24 hours/day Type of Home: House Home Access: Stairs to enter   [employer] of Steps: 3 at side with no  rail and 8 at rails at front; pt goes through side   Home Layout: One level Home Equipment: Agricultural consultant (2 wheels);BSC/3in1      Prior Function Prior Level of Function : Independent/Modified Independent;Working/employed;Driving;History of Falls (last six months)             Mobility Comments: No AD, fell out of tub in August 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 Extremity Assessment Upper Extremity Assessment: Defer to OT evaluation;Right hand dominant    Lower Extremity Assessment Lower Extremity Assessment: Overall WFL for tasks assessed (symmetrically intact and WFL bil strength (grossly >/= 4+/5), sensation, and coordination)    Cervical / Trunk Assessment Cervical / Trunk Assessment: Normal  Communication   Communication Communication: Impaired (denied changes compared to baseline but noted some word finding difficulty, could be related due to limited sleep since admission though) Factors Affecting Communication: Difficulty expressing self (denied changes compared to baseline but noted some word finding difficulty, could be related due to limited sleep since admission though)    Cognition Arousal: Alert Behavior During Therapy: WFL for tasks assessed/performed   PT - Cognitive impairments: No apparent impairments                       PT - Cognition Comments: A&Ox4 (stated 10/9, but pretty close) Following commands: Intact       Cueing Cueing Techniques: Verbal cues     General Comments General comments (skin integrity, edema, etc.): Educated pt on BE FAST, she verbalized understanding    Exercises     Assessment/Plan    PT Assessment Patient does not need any further PT services  PT Problem List Impaired sensation       PT Treatment Interventions      PT Goals (Current goals can be found in the Care Plan section)  Acute Rehab PT Goals Patient Stated Goal: to improve R UE function PT Goal  Formulation: All assessment and education complete, DC therapy Time For Goal Achievement: 04/08/24 Potential to Achieve Goals: Good    Frequency       Co-evaluation   Reason for Co-Treatment: For patient/therapist safety;To address functional/ADL transfers;Other (comment) (unsure of extent of deficits this time with more CVAs) PT goals addressed during session: Mobility/safety with mobility;Balance OT goals addressed during session: ADL's and self-care       AM-PAC PT 6 Clicks Mobility  Outcome Measure Help needed turning from your back to your side while in a flat bed without using bedrails?: None Help needed moving from lying on your back to sitting on the side of a flat bed without using bedrails?: None Help needed moving to and from a bed to a chair (including a wheelchair)?: None Help needed standing up from a chair using your arms (e.g., wheelchair or bedside chair)?: None Help needed to walk in hospital room?: None Help needed climbing 3-5 steps with a railing? : None 6 Click Score: 24    End of Session   Activity Tolerance: Patient tolerated treatment well Patient left: in bed;with call bell/phone within reach  PT Visit Diagnosis: Other symptoms and signs involving the nervous system (R29.898)    Time: 9078-9056 PT Time Calculation (min) (ACUTE ONLY): 22 min   Charges:   PT Evaluation $PT Eval Low Complexity: 1 Low   PT General Charges $$ ACUTE PT VISIT: 1 Visit         Theo Ferretti, PT, DPT Acute Rehabilitation Services  Office: [phone]   Theo CHRISTELLA Ferretti 04/07/2024, 9:53 AM

## 2024-04-07 NOTE — Progress Notes (Signed)
   04/07/24 1019  TOC Brief Assessment  Insurance and Status Reviewed  Patient has primary care physician Yes  Home environment has been reviewed From home  Prior level of function: Independent  Prior/Current Home Services No current home services  Social Drivers of Health Review SDOH reviewed no interventions necessary  Readmission risk has been reviewed Yes  Transition of care needs no transition of care needs at this time   Pt previously admitted c/CVA discharged home yesterday c/outpatient therapy referral at [facility]. DME at home: walker/ BSC/ shower seat.

## 2024-04-07 NOTE — Progress Notes (Signed)
  Progress Note   Patient: Barbara Kaiser FMW:[id number] DOB: 1955/09/16 DOA: 04/06/2024     0 DOS: the patient was seen and examined on 04/07/2024   Brief hospital course:  68 y.o. female with medical history significant for hypertension, hyperlipidemia, and recent stroke who returns to the ED with right hand and tongue numbness. MRI brain reveals many small acute left MCA infarcts and acute right basal ganglia perforator infarct. Neurology was consulted by the ED physician and recommended medical admission and cardiology consultation for TEE (to be done on 10/9).   Assessment and Plan:   Acute ischemic CVA, POA:   MRI brain reveals many small acute left MCA infarcts and acute right basal ganglia perforator infarct. - Neurology recommended TEE, which will be done tomorrow.  - She passed stroke swallow screen in ED  - Continue cardiac monitoring and neuro checks, permit HTN in acute-phase of ischemic CVA, continue DAPT and Crestor -PT/OT evaluations ordered   HTN  - Hold antihypertensives in acute-phase of ischemic stroke and treat as-needed only for now    HLD  - Crestor    Disposition: Home     Subjective: No acute events overnight. Complaining of slightly decreased strength in her right hand. We spoke about the need to do TEE to rule out cardioembolic cause of the stroke. Her daughter was present at the bedside.  Physical Exam: Vitals:   04/07/24 0900 04/07/24 0951 04/07/24 1000 04/07/24 1100  BP: (!) 157/82  (!) 146/73 (!) 153/83  Pulse: 73  65 71  Resp: 16  13 18   Temp:  98.6 F (37 C)    TempSrc:  Oral    SpO2: 96%  97% 99%  Weight:      Height:       Constitutional: NAD, calm, comfortable Eyes: PERRL, lids and conjunctivae normal ENMT: Mucous membranes are moist. Posterior pharynx clear of any exudate or lesions.Normal dentition.  Neck: normal, supple, no masses, no thyromegaly Respiratory: clear to auscultation bilaterally, no wheezing, no crackles. Normal  respiratory effort. No accessory muscle use.  Cardiovascular: Regular rate and rhythm, no murmurs / rubs / gallops. No extremity edema. 2+ pedal pulses. No carotid bruits.  Abdomen: no tenderness, no masses palpated. No hepatosplenomegaly. Bowel sounds positive.  Musculoskeletal: no clubbing / cyanosis. No joint deformity upper and lower extremities. Good ROM, no contractures. Normal muscle tone.  Skin: no rashes, lesions, ulcers. No induration Neurologic: CN 2-12 grossly intact. Sensation intact, DTR normal. Strength 5/5 x all 4 extremities.  Psychiatric: Normal judgment and insight. Alert and oriented x 3. Normal mood.   Data Reviewed:  There are no new results to review at this time.  Family Communication: Daughter at the bedside  Disposition: Status is: Observation The patient remains OBS appropriate and will d/c before 2 midnights.  Planned Discharge Destination: Home with Home Health    Time spent: 39 minutes  Author: Deliliah Room, MD 04/07/2024 11:42 AM  For on call review [email or web address].

## 2024-04-07 NOTE — Progress Notes (Signed)
   [facility] has been requested to perform a transesophageal echocardiogram on Barbara Kaiser for an acute CVA.    The patient passed a swallow study in the ED.   The patient does NOT have any absolute or relative contraindications to a Transesophageal Echocardiogram (TEE).  The patient has: No other conditions that may impact this procedure.   After careful review of history and examination, the risks and benefits of transesophageal echocardiogram have been explained including risks of esophageal damage, perforation (1:10,000 risk), bleeding, pharyngeal hematoma as well as other potential complications associated with conscious sedation including aspiration, arrhythmia, respiratory failure and death. Alternatives to treatment were discussed, questions were answered. Patient is willing to proceed.   SignedMorse Clause, PA-C  04/07/2024 3:17 PM

## 2024-04-07 NOTE — Progress Notes (Addendum)
 STROKE TEAM PROGRESS NOTE    SIGNIFICANT HOSPITAL EVENTS 29/44 - 68 year old female who presented with acute onset of right hand and right tongue numbness earlier that afternoon. Patient was recently discharged on 04/05/24 after acute ischemic stroke of L MCA, during which she also had right hand numbness and weakness that resolved before discharge. She was started on DAPT of aspirin and Plavix, and a statin.  MRI shows acute new right basal ganglia perforator infarct not seen on the recent MRI few days ago. Many small acute infarcts in the left MCA territory likely unchanged from recent MRI.   INTERIM HISTORY/SUBJECTIVE Patient seen at bedside accompanied by daughter. Patient reports she felt fine initially when she went home from hospital on 10/6, but began experiencing right-sided finger numbness and tingling and right-sided tongue numbness the next day. Upon arrival to the ED, her symptoms had resolved. She denied any tingling or numbness on exam today.   OBJECTIVE  CBC    Component Value Date/Time   WBC 7.6 04/07/2024 0252   RBC 4.91 04/07/2024 0252   HGB 14.2 04/07/2024 0252   HGB 14.5 08/21/2021 0749   HCT 43.2 04/07/2024 0252   HCT 45.0 08/21/2021 0749   PLT 300 04/07/2024 0252   PLT 264 08/21/2021 0749   MCV 88.0 04/07/2024 0252   MCV 86 08/21/2021 0749   MCV 87 07/20/2014 2216   MCH 28.9 04/07/2024 0252   MCHC 32.9 04/07/2024 0252   RDW 12.6 04/07/2024 0252   RDW 12.2 08/21/2021 0749   RDW 13.2 07/20/2014 2216   LYMPHSABS 1.9 04/06/2024 1454   LYMPHSABS 1.7 08/21/2021 0749   LYMPHSABS 3.4 07/20/2014 2216   MONOABS 0.5 04/06/2024 1454   MONOABS 0.7 07/20/2014 2216   EOSABS 0.2 04/06/2024 1454   EOSABS 0.2 08/21/2021 0749   EOSABS 0.4 07/20/2014 2216   BASOSABS 0.1 04/06/2024 1454   BASOSABS 0.0 08/21/2021 0749   BASOSABS 0.1 07/20/2014 2216    BMET    Component Value Date/Time   NA 137 04/07/2024 0252   NA 142 08/21/2021 0749   NA 142 07/20/2014 2216   K 3.3  (L) 04/07/2024 0252   K 3.3 (L) 07/20/2014 2216   CL 104 04/07/2024 0252   CL 107 07/20/2014 2216   CO2 23 04/07/2024 0252   CO2 27 07/20/2014 2216   GLUCOSE 106 (H) 04/07/2024 0252   GLUCOSE 137 (H) 07/20/2014 2216   BUN 12 04/07/2024 0252   BUN 17 08/21/2021 0749   BUN 12 07/20/2014 2216   CREATININE 0.70 04/07/2024 0252   CREATININE 0.90 07/20/2014 2216   CALCIUM  9.1 04/07/2024 0252   CALCIUM  8.8 07/20/2014 2216   EGFR 94 08/21/2021 0749   GFRNONAA >60 04/07/2024 0252   GFRNONAA >60 07/20/2014 2216    IMAGING past 24 hours MR BRAIN WO CONTRAST Result Date: 04/06/2024 EXAM: MR Brain without Intravenous Contrast. CLINICAL HISTORY: Neuro deficit, acute, stroke suspected. She was discharged from the stroke service yesterday after presenting with right hand weakness and numbness which had previously resolved. She was found to have small acute infarcts in the left MCA territory that was felt to be due to an embolic; cryptogenic source. TTE at that time showed no intracardiac clot. TECHNIQUE: Magnetic resonance images of the brain without intravenous contrast in multiple planes. CONTRAST: Without. COMPARISON: None provided. FINDINGS: BRAIN: Acute right basal ganglia perforator infarct. Many small acute infarcts in the left MCA territory including in the left frontal and parietal lobes. Moderate t2/flair hyperintensities in the  white matter, compatible with chronic microvascular ischemic change. No intracranial mass or hemorrhage. No midline shift or extra-axial fluid collection. No cerebellar tonsillar ectopia. The central arterial and venous flow voids are patent. VENTRICLES: No hydrocephalus. ORBITS: The orbits are normal. SINUSES AND MASTOIDS: The sinuses and mastoid air cells are clear. BONES: No acute fracture or focal osseous lesion. IMPRESSION: 1. Many small acute infarcts in the left MCA territory. 2. Acute right basal ganglia perforator infarct. Electronically signed by: Gilmore Molt MD  04/06/2024 08:02 PM EDT RP Workstation: [id number]   CT HEAD CODE STROKE WO CONTRAST Result Date: 04/06/2024 CLINICAL DATA:  Provided history: Code stroke. Neuro deficit, acute, stroke suspected. EXAM: CT HEAD WITHOUT CONTRAST TECHNIQUE: Contiguous axial images were obtained from the base of the skull through the vertex without intravenous contrast. RADIATION DOSE REDUCTION: This exam was performed according to the departmental dose-optimization program which includes automated exposure control, adjustment of the mA and/or kV according to patient size and/or use of iterative reconstruction technique. COMPARISON:  Brain MRI 04/04/2024. Non-contrast head CT and CT angiogram head/neck 04/04/2024.A FINDINGS: Brain: No age-advanced or lobar predominant cerebral atrophy. Known patchy acute left MCA territory infarcts, overall better appreciated on the recent prior brain MRI of 04/04/2024. As before, some of these infarcts involve the pre and postcentral gyri. No CT evidence of an interval acute intracranial abnormality. Small chronic cortical infarct within the left parietal lobe, better appreciated on the prior MRI. Unchanged chronic lacunar infarct within the left corona radiata. Moderate patchy and ill-defined hypoattenuation elsewhere within the cerebral white matter, nonspecific but compatible with chronic small vessel ischemic disease. There is no acute intracranial hemorrhage. No extra-axial fluid collection. No evidence of an intracranial mass. No midline shift. Vascular: No hyperdense vessel.  Atherosclerotic calcifications. Skull: No calvarial fracture or aggressive osseous lesion. Sinuses/Orbits: No orbital mass or acute orbital finding. Pansinusitis, not significant changed at the imaged levels. ASPECTS [facility] Stroke Program Early CT Score) - Ganglionic level infarction (caudate, lentiform nuclei, internal capsule, insula, M1-M3 cortex): 7 - Supraganglionic infarction (M4-M6 cortex): 1 Total score (0-10  with 10 being normal): 8 No evidence of an interval acute intracranial abnormality. These results were communicated to Dr. Matthews at 2:05 pmon 10/7/2025by text page via the [facility] messaging system. IMPRESSION: 1. Known patchy acute left MCA territory infarcts, overall better appreciated on the recent prior brain MRI of 04/04/2024. 2. No evidence of an interval acute intracranial abnormality 3. Background chronic small vessel ischemic disease and chronic infarcts, as described. Electronically Signed   By: Rockey Childs D.O.   On: 04/06/2024 14:06    Vitals:   04/07/24 0900 04/07/24 0951 04/07/24 1000 04/07/24 1100  BP: (!) 157/82  (!) 146/73 (!) 153/83  Pulse: 73  65 71  Resp: 16  13 18   Temp:  98.6 F (37 C)    TempSrc:  Oral    SpO2: 96%  97% 99%  Weight:      Height:         PHYSICAL EXAM General:  Alert, well-nourished, well-developed patient in no acute distress Psych:  Mood and affect appropriate for situation CV: Regular rate and rhythm on monitor Respiratory:  Regular, unlabored respirations on room air   NEURO:  Mental Status: AA&Ox3, patient is able to give clear and coherent history Speech/Language: speech is without dysarthria or aphasia. Naming, repetition, fluency, and comprehension intact.  Cranial Nerves:  II: PERRL. Visual fields full.  III, IV, VI: EOMI. Eyelids elevate symmetrically.  V: Sensation is  intact to light touch and symmetrical to face.  VII: Face is symmetrical resting and smiling VIII: hearing intact to voice. IX, X: Palate elevates symmetrically. Phonation is normal.  KP:Dynloizm shrug 5/5. XII: tongue is midline without fasciculations. Motor: 5/5 strength to all muscle groups tested.  Tone: is normal and bulk is normal Sensation- Intact to light touch bilaterally. Extinction absent to light touch to DSS.  Coordination: FTN intact bilaterally, HKS: no ataxia in BLE. No drift.  Gait- deferred  Most Recent NIH: 0   ASSESSMENT/PLAN  Ms. Barbara  Kaiser is a 68 y.o. female with history of HTN, HLD, recent L parietal MCA branch infarct admitted for right-sided finger numbness and tingling and right-sided tongue numbness. NIH on Admission 2.  On assessment today, patient's presenting symptoms of right tongue numbness and right finger tingling are improved. These symptoms are likely residual from her recent stroke involving the L parietal lobe MCA, as it is not uncommon for symptoms to wax and wane post-stroke. Patient does appear to have a new acute infarct in the R basal ganglia, however she does not demonstrate typical neurological deficits that would be seen in the location of infarct. Suspect new R basal ganglia infarction is likely a silent stroke. Given patient has had two strokes within a very short time span, recommend starting Aspirin + Brilinta for 4 weeks, and then aspirin alone.  Acute Ischemic Infarct: right basal ganglia asymptomatic Etiology:  cryptogenic  Code Stroke CT head:  Known patchy acute left MCA territory infarcts, overall better appreciated on the recent prior brain MRI of 04/04/2024. No evidence of an interval acute intracranial abnormality. Background chronic small vessel ischemic disease and chronic infarcts, as described. MRI: Acute right basal ganglia perforator infarct. Many small acute infarcts in the left MCA territory.  2D Echo  LDL 123 HgbA1c 5.4 VTE prophylaxis - Lovenox Patient recently started on aspirin 81 mg daily and clopidogrel 75 mg daily prior to admission. Recommend starting Aspirin 81 mg and Brilinta 90 mg BID for 4 weeks and then aspirin alone. Therapy recommendations:  No follow up needed  Disposition: Home  Hx of Stroke/TIA Acute infarction of L MCA territory predominately affecting the precentral and postcentral gyri on 04/04/24 of cryptogenic etiology   Atrial fibrillation Home Meds: None Continue telemetry monitoring Recommend 30-day loop heart monitor to assess for paroxysmal A-fib      Hypertension Home meds: hold metoprolol  tartrate 25 mg BID Stable Blood Pressure Goal: BP less than 220/110    Hyperlipidemia Home meds: Ezetimibe 10 mg daily. Rosuvastatin 10 mg, resumed in hospital. LDL 123, goal < 70 Continue statin at discharge   Blood Glucose - Controlled Home meds:  None HgbA1c 5.4, goal < 7.0   Other Stroke Risk Factors Obesity, Body mass index is 32.25 kg/m., BMI >/= 30 associated with increased stroke risk, recommend weight loss, diet and exercise as appropriate  Family hx stroke (Grandmother) Mild coronary artery disease   Hospital day # 0  I have personally obtained history,examined this patient, reviewed notes, independently viewed imaging studies, participated in medical decision making and plan of care.ROS completed by me personally and pertinent positives fully documented  I have made any additions or clarifications directly to the above note. Agree with note above.  Patient with recent left MCA branch infarct of cryptogenic etiology presents with recurrence of right-sided symptoms but MRI shows a incidental new right basal ganglia infarct not seen on recent imaging.  Recommend change aspirin and Plavix to aspirin and  Brilinta for 4 weeks followed by aspirin alone and aggressive risk factor modification.  Outpatient 30-day heart monitor for paroxysmal A-fib.  Maintain aggressive risk factor modification.  Long discussion with patient and daughter at the bedside and answered questions.  Follow-up as an outpatient with stroke clinic in 2 months with nurse practitioner.   I personally spent a total of 50 minutes in the care of the patient today including getting/reviewing separately obtained history, performing a medically appropriate exam/evaluation, counseling and educating, placing orders, referring and communicating with other health care professionals, documenting clinical information in the EHR, independently interpreting results, and coordinating care.         Eather Popp, MD Medical Director [facility] Pager: [phone] 04/07/2024 5:05 PM   To contact Stroke Continuity provider, please refer to [email or web address]. After hours, contact General Neurology

## 2024-04-07 NOTE — Progress Notes (Signed)
 Occupational Therapy Evaluation Patient Details Name: Barbara Kaiser MRN: [id number] DOB: 08/22/1955 Today's Date: 04/07/2024   History of Present Illness   Pt is a 68 y.o. female who presented 10/7 with R face and hand numbness. Pt recently admitted for L MCA CVA and discharged home 10/6. MRI this admission showed many small acute infarcts in the left MCA territory an acute right basal ganglia perforator infarct. PMH: HTN, hyperlipidemia, asthma.     Clinical Impressions Patient reports being Independent in ADLs and IADLs  and living with her friend in a 1 level home with 3-8 STE.  Patient currently presents with decreased R hand grip strength and dexterity which patient reports has been ongoing since last admission for MCA CVA and was getting ready to start outpatient OT.  Patient completed functional mobility Independently and able to don socks with SBA  mainly 2/2 decreased dexterity in R hand.  Patient would benefit from additional OT to address R hand strength and dexterity and follow up with outpatient OT once discharged.     If plan is discharge home, recommend the following:   Assistance with cooking/housework     Functional Status Assessment   Patient has had a recent decline in their functional status and demonstrates the ability to make significant improvements in function in a reasonable and predictable amount of time.     Equipment Recommendations   None recommended by OT     Recommendations for Other Services         Precautions/Restrictions   Precautions Precautions: None Restrictions Weight Bearing Restrictions Per Provider Order: No     Mobility Bed Mobility Overal bed mobility: Modified Independent Bed Mobility: Supine to Sit, Sit to Supine     Supine to sit: Modified independent (Device/Increase time), HOB elevated Sit to supine: Modified independent (Device/Increase time), HOB elevated   General bed mobility comments: HOB elevated, no  assistance needed    Transfers Overall transfer level: Independent Equipment used: None               General transfer comment: Pt able to stand from edge of stretcher without LOB or assistance      Balance Overall balance assessment: Independent                                         ADL either performed or assessed with clinical judgement   ADL Overall ADL's : Modified independent                                             Vision Baseline Vision/History: 1 Wears glasses (reading glasses) Ability to See in Adequate Light: 0 Adequate Patient Visual Report: No change from baseline Vision Assessment?: No apparent visual deficits;Wears glasses for reading     Perception Perception: Within Functional Limits       Praxis Praxis: [facility]       Pertinent Vitals/Pain Pain Assessment Pain Assessment: 0-10 Pain Score: 2  Faces Pain Scale: Hurts a little bit Pain Location: R posterior lower leg from prior injury (hit it against tub during fall in August) Pain Descriptors / Indicators: Discomfort, Sore Pain Intervention(s): Monitored during session     Extremity/Trunk Assessment Upper Extremity Assessment Upper Extremity Assessment: Overall WFL for tasks assessed;Right hand dominant RUE Deficits /  Details: 4/5 GROSS GRASP DECREASED OPPOSTION OF 3RD-5TH DIGITS RUE Sensation: WNL (reports b/l hands feel the same when senation tested but then reports that the R hand still doesnt feel 100% right) RUE Coordination: decreased fine motor   Lower Extremity Assessment Lower Extremity Assessment: Defer to PT evaluation   Cervical / Trunk Assessment Cervical / Trunk Assessment: Normal   Communication     Cognition Arousal: Alert Behavior During Therapy: WFL for tasks assessed/performed Cognition: No apparent impairments                               Following commands: Intact       Cueing  General Comments   Cueing  Techniques: Verbal cues      Exercises     Shoulder Instructions      Home Living Family/patient expects to be discharged to:: Private residence Living Arrangements: Non-relatives/Friends Available Help at Discharge: Family;Available 24 hours/day Type of Home: House Home Access: Stairs to enter [employer] of Steps: 3 at side with no rail and 8 at rails at front; pt goes through side   Home Layout: One level     Bathroom Shower/Tub: Chief Strategy Officer: Standard Bathroom Accessibility: No   Home Equipment: Agricultural consultant (2 wheels);BSC/3in1          Prior Functioning/Environment Prior Level of Function : Independent/Modified Independent;Working/employed;Driving;History of Falls (last six months)             Mobility Comments: No AD, fell out of tub in August ADLs Comments: works as a Therapist, art Problem List: Decreased coordination;Decreased strength;Impaired UE functional use   OT Treatment/Interventions: Neuromuscular education;Therapeutic exercise      OT Goals(Current goals can be found in the care plan section)   Acute Rehab OT Goals OT Goal Formulation: With patient Time For Goal Achievement: 04/21/24 Potential to Achieve Goals: Good   OT Frequency:  Min 2X/week    Co-evaluation   Reason for Co-Treatment: For patient/therapist safety;To address functional/ADL transfers;Other (comment) PT goals addressed during session: Mobility/safety with mobility;Balance OT goals addressed during session: ADL's and self-care      AM-PAC OT 6 Clicks Daily Activity     Outcome Measure Help from another person eating meals?: None Help from another person taking care of personal grooming?: None Help from another person toileting, which includes using toliet, bedpan, or urinal?: None Help from another person bathing (including washing, rinsing, drying)?: None Help from another person to put on and taking off regular upper body  clothing?: None Help from another person to put on and taking off regular lower body clothing?: None 6 Click Score: 24   End of Session Nurse Communication: Mobility status  Activity Tolerance: Patient tolerated treatment well Patient left: in bed;with call bell/phone within reach;with nursing/sitter in room;Other (comment)  OT Visit Diagnosis: Muscle weakness (generalized) (M62.81)                Time: 9078-9055 OT Time Calculation (min): 23 min Charges:  OT General Charges $OT Visit: 1 Visit OT Evaluation $OT Eval Low Complexity: 1 Low OT Treatments $Self Care/Home Management : 8-22 mins  Reva Pouch OT/L  Lamarr JONETTA Pouch 04/07/2024, 2:23 PM

## 2024-04-07 NOTE — Progress Notes (Signed)
 Patient received from the ED. On RA. Alert and Oriented. Connected to the cardiac monitor box 18. Family at bed side. Call bell within the reach. Patient remains in the bed.

## 2024-04-07 NOTE — Progress Notes (Signed)
 Monitor post CVA

## 2024-04-08 ENCOUNTER — Ambulatory Visit (HOSPITAL_COMMUNITY): Payer: Self-pay

## 2024-04-08 ENCOUNTER — Observation Stay (HOSPITAL_COMMUNITY)

## 2024-04-08 ENCOUNTER — Encounter (HOSPITAL_COMMUNITY): Admission: EM | Disposition: A | Discharge status: Home / Self Care | Payer: Self-pay | Source: Home / Self Care

## 2024-04-08 ENCOUNTER — Observation Stay (HOSPITAL_COMMUNITY): Payer: Self-pay

## 2024-04-08 DIAGNOSIS — I639 Cerebral infarction, unspecified: Secondary | ICD-10-CM

## 2024-04-08 HISTORY — PX: TRANSESOPHAGEAL ECHOCARDIOGRAM (CATH LAB): EP1270

## 2024-04-08 LAB — BASIC METABOLIC PANEL WITH GFR
Anion gap: 10 (ref 5–15)
BUN: 12 mg/dL (ref 8–23)
CO2: 22 mmol/L (ref 22–32)
Calcium: 9.1 mg/dL (ref 8.9–10.3)
Chloride: 107 mmol/L (ref 98–111)
Creatinine, Ser: 0.82 mg/dL (ref 0.44–1.00)
GFR, Estimated: >60 mL/min (ref >60)
Glucose, Bld: 111 mg/dL — ABNORMAL HIGH (ref 70–99)
Potassium: 3.8 mmol/L (ref 3.5–5.1)
Sodium: 139 mmol/L (ref 135–145)

## 2024-04-08 LAB — CBC
HCT: 44.2 % (ref 36.0–46.0)
Hemoglobin: 14.4 g/dL (ref 12.0–15.0)
MCH: 28.6 pg (ref 26.0–34.0)
MCHC: 32.6 g/dL (ref 30.0–36.0)
MCV: 87.7 fL (ref 80.0–100.0)
Platelets: 281 K/uL (ref 150–400)
RBC: 5.04 MIL/uL (ref 3.87–5.11)
RDW: 12.6 % (ref 11.5–15.5)
WBC: 7.1 K/uL (ref 4.0–10.5)
nRBC: 0 % (ref 0.0–0.2)

## 2024-04-08 LAB — ECHO TEE: Est EF: >75

## 2024-04-08 SURGERY — TRANSESOPHAGEAL ECHOCARDIOGRAM (TEE) (CATHLAB)
Anesthesia: Monitor Anesthesia Care

## 2024-04-08 MED ORDER — PROPOFOL 10 MG/ML IV BOLUS
INTRAVENOUS | Status: DC | PRN
  Administered 2024-04-08 (×2): 30 mg via INTRAVENOUS
  Administered 2024-04-08: 60 mg via INTRAVENOUS
  Administered 2024-04-08: 30 mg via INTRAVENOUS
  Administered 2024-04-08: 40 mg via INTRAVENOUS

## 2024-04-08 MED ORDER — LIDOCAINE 2% (20 MG/ML) 5 ML SYRINGE
INTRAMUSCULAR | Status: DC | PRN
  Administered 2024-04-08: 100 mg via INTRAVENOUS

## 2024-04-08 MED ORDER — PROPOFOL 500 MG/50ML IV EMUL
INTRAVENOUS | Status: DC | PRN
  Administered 2024-04-08: 150 ug/kg/min via INTRAVENOUS

## 2024-04-08 MED ORDER — TICAGRELOR 90 MG PO TABS: 90.0000 mg | ORAL_TABLET | Freq: Two times a day (BID) | ORAL | 0 refills | Status: AC

## 2024-04-08 MED ORDER — SODIUM CHLORIDE 0.9 % IV SOLN: INTRAVENOUS | Status: DC

## 2024-04-08 NOTE — Anesthesia Preprocedure Evaluation (Addendum)
 Anesthesia Evaluation  Patient identified by MRN, date of birth, ID band Patient awake    Reviewed: Allergy & Precautions, H&P , NPO status , Patient's Chart, lab work & pertinent test results  History of Anesthesia Complications (+) PONV and history of anesthetic complications  Airway Mallampati: II  TM Distance: >3 FB Neck ROM: Full    Dental  (+) Upper Dentures, Lower Dentures   Pulmonary asthma , former smoker   Pulmonary exam normal breath sounds clear to auscultation       Cardiovascular hypertension, + CAD  Normal cardiovascular exam Rhythm:Regular Rate:Normal     Neuro/Psych RUE weakness CVA, Residual Symptoms  negative psych ROS   GI/Hepatic negative GI ROS, Neg liver ROS,,,  Endo/Other  negative endocrine ROS    Renal/GU negative Renal ROS  negative genitourinary   Musculoskeletal negative musculoskeletal ROS (+)    Abdominal   Peds negative pediatric ROS (+)  Hematology  (+) Blood dyscrasia, anemia   Anesthesia Other Findings   Reproductive/Obstetrics negative OB ROS                              Anesthesia Physical Anesthesia Plan  ASA: 3  Anesthesia Plan: MAC   Post-op Pain Management:    Induction: Intravenous  PONV Risk Score and Plan: 3 and Propofol  infusion and Treatment may vary due to age or medical condition  Airway Management Planned: Natural Airway  Additional Equipment:   Intra-op Plan:   Post-operative Plan:   Informed Consent: I have reviewed the patients History and Physical, chart, labs and discussed the procedure including the risks, benefits and alternatives for the proposed anesthesia with the patient or authorized representative who has indicated his/her understanding and acceptance.     Dental advisory given  Plan Discussed with: CRNA  Anesthesia Plan Comments:          Anesthesia Quick Evaluation

## 2024-04-08 NOTE — Progress Notes (Signed)
 AVS instructions discussed with patient and daughter at bedside. Patient and daughter verbalized understanding of all teaching including signs/symptoms of stroke and medication changes. Work note included in the discharge paperwork. RN verified [facility] listed was the correct pharmacy. PIV removed. Patient discharged via wheelchair transport to the front in stable condition with all belongings.

## 2024-04-08 NOTE — Anesthesia Postprocedure Evaluation (Signed)
 Anesthesia Post Note  Patient: Barbara Kaiser  Procedure(s) Performed: TRANSESOPHAGEAL ECHOCARDIOGRAM     Patient location during evaluation: PACU Anesthesia Type: MAC Level of consciousness: awake and alert Pain management: pain level controlled Vital Signs Assessment: post-procedure vital signs reviewed and stable Respiratory status: spontaneous breathing, nonlabored ventilation, respiratory function stable and patient connected to nasal cannula oxygen Cardiovascular status: stable and blood pressure returned to baseline Postop Assessment: no apparent nausea or vomiting Anesthetic complications: no   No notable events documented.  Last Vitals:  Vitals:   04/08/24 1257 04/08/24 1307  BP: 132/70 (!) 142/72  Pulse: 80 79  Resp: 20 16  Temp:    SpO2: 97% 95%    Last Pain:  Vitals:   04/08/24 1307  TempSrc:   PainSc: 0-No pain                 Thom JONELLE Peoples

## 2024-04-08 NOTE — Progress Notes (Signed)
  Echocardiogram Echocardiogram Transesophageal has been performed.  [place] R 04/08/2024, 1:00 PM

## 2024-04-08 NOTE — Interval H&P Note (Signed)
 History and Physical Interval Note:  04/08/2024 12:04 PM  Barbara Kaiser  has presented today for surgery, with the diagnosis of stroke.  The various methods of treatment have been discussed with the patient and family. After consideration of risks, benefits and other options for treatment, the patient has consented to  Procedure(s): TRANSESOPHAGEAL ECHOCARDIOGRAM (N/A) as a surgical intervention.  The patient's history has been reviewed, patient examined, no change in status, stable for surgery.  I have reviewed the patient's chart and labs.  Questions were answered to the patient's satisfaction.     Caitlin Hillmer

## 2024-04-08 NOTE — Progress Notes (Signed)
 Mobility Specialist: Progress Note   04/08/24 1041  Mobility  Activity Ambulated with assistance  Level of Assistance Standby assist, set-up cues, supervision of patient - no hands on  Assistive Device Other (Comment) (IV pole)  Distance Ambulated (ft) 200 ft  Activity Response Tolerated well  Mobility Referral Yes  Mobility visit 1 Mobility  Mobility Specialist Start Time (ACUTE ONLY) 0941  Mobility Specialist Stop Time (ACUTE ONLY) 0951  Mobility Specialist Time Calculation (min) (ACUTE ONLY) 10 min    Pt received sitting EOB, pleasant and agreeable to mobility session. SV throughout with IV pole. C/o R calf pain from previous fall. Returned to room without fault. Left on EOB with all needs met, call bell in reach.   Ileana Lute Mobility Specialist Please contact via SecureChat or Rehab office at [phone]

## 2024-04-08 NOTE — CV Procedure (Signed)
    TRANSESOPHAGEAL ECHOCARDIOGRAM   NAME:  Barbara Kaiser   MRN: [id number] DOB:  October 26, 1955   ADMIT DATE: 04/06/2024  INDICATIONS:  CVA  PROCEDURE:   Informed consent was obtained prior to the procedure. The risks, benefits and alternatives for the procedure were discussed and the patient comprehended these risks.  Risks include, but are not limited to, cough, sore throat, vomiting, nausea, somnolence, esophageal and stomach trauma or perforation, bleeding, low blood pressure, aspiration, pneumonia, infection, trauma to the teeth and death.    After a procedural time-out, the patient was sedated by the anesthesia service. Once an appropriate level of sedation was achieved, the transesophageal probe was inserted in the esophagus and stomach without difficulty and multiple views were obtained.    COMPLICATIONS:    There were no immediate complications.  FINDINGS:  LEFT VENTRICLE: EF > 75% %. Hyperdynamic Severe LVH  RIGHT VENTRICLE: Normal size and function.   LEFT ATRIUM: Normal  LEFT ATRIAL APPENDAGE: No thrombus.   RIGHT ATRIUM: Normal  AORTIC VALVE:  Trileaflet.  NO AI/AS  MITRAL VALVE:    Normal. Trivial MR  TRICUSPID VALVE: Normal. Trivial TR  PULMONIC VALVE: Grossly normal. Trivial PR  INTERATRIAL SEPTUM: No PFO or ASD. Bubble study negative  PERICARDIUM: No effusion  DESCENDING AORTA: Mild plaque   Toribio Cherrie COME 12:42 PM

## 2024-04-08 NOTE — Progress Notes (Signed)
 SLP Cancellation Note  Patient Details Name: Barbara Kaiser MRN: [id number] DOB: Feb 27, 1956   Cancelled treatment:       Reason Eval/Treat Not Completed: Patient at procedure or test/unavailable. Pt currently in TEE. Will continue efforts.    Dustin Olam Bull 04/08/2024, 10:29 AM

## 2024-04-08 NOTE — Discharge Summary (Signed)
 Physician Discharge Summary   Patient: Barbara Kaiser MRN: [id number] DOB: 20-Sep-1955  Admit date:     04/06/2024  Discharge date: 04/08/24  Discharge Physician: Deliliah Room   PCP: Bertrum Charlie CROME, MD   Recommendations at discharge:    F/u with your PCP in one week F/u with neurologist in a month.  Discharge Diagnoses: Principal Problem:   Acute CVA (cerebrovascular accident) [facility]) Active Problems:   HLD (hyperlipidemia)   Essential hypertension    Hospital Course:  68 y.o. female with medical history significant for hypertension, hyperlipidemia, and recent stroke who returns to the ED with right hand and tongue numbness. MRI brain reveals many small acute left MCA infarcts and acute right basal ganglia perforator infarct. Neurology was consulted by the ED physician and recommended medical admission and cardiology consulted for TEE. TEE done on 10/9 was unremarkable. Discussed with Dr Rosemarie and patient was discharged on ASA and Brilinta x 4 weeks and then aspirin alone. 30 Day cardiac monitor will be mailed to the patient.  Spoke to patient' s daughter and answered all of her questions on the discharge day Patient will go to outpatient OT at [facility]. She does have walker/BSC/Shower seat at home.      Consultants: Neurology, cardiology Procedures performed: TEE  Disposition: Home Diet recommendation:  Cardiac diet DISCHARGE MEDICATION: Allergies as of 04/08/2024   No Known Allergies      Medication List     STOP taking these medications    clopidogrel 75 MG tablet Commonly known as: Plavix       TAKE these medications    aspirin EC 81 MG tablet Take 1 tablet (81 mg total) by mouth daily. Swallow whole.   ezetimibe 10 MG tablet Commonly known as: ZETIA Take 1 tablet by mouth daily.   fluticasone  50 MCG/ACT nasal spray Commonly known as: FLONASE  Place 2 sprays into both nostrils daily. What changed:  when to take this reasons to take this    methocarbamol 500 MG tablet Commonly known as: ROBAXIN Take 500 mg by mouth as needed for muscle spasms.   metoprolol  tartrate 25 MG tablet Commonly known as: LOPRESSOR  Take 1 tablet (25 mg total) by mouth 2 (two) times daily.   ondansetron  4 MG disintegrating tablet Commonly known as: ZOFRAN -ODT Take 1 tablet (4 mg total) by mouth every 6 (six) hours as needed for nausea or vomiting.   PUMPKIN SEED OIL PO Take 1 tablet by mouth 1 day or 1 dose.   rosuvastatin 10 MG tablet Commonly known as: CRESTOR Take 1 tablet (10 mg total) by mouth daily.   ticagrelor 90 MG Tabs tablet Commonly known as: Brilinta Take 1 tablet (90 mg total) by mouth 2 (two) times daily for 28 days.        Follow-up Information     Bertrum Charlie CROME, MD. Schedule an appointment as soon as possible for a visit in 1 week(s).   Specialty: Family Medicine Contact information: [address] [place] [address] [phone]         Rosemarie Eather RAMAN, MD. Schedule an appointment as soon as possible for a visit in 1 month(s).   Specialties: Neurology, Radiology Contact information: [address] [place] [address] [phone]                Discharge Exam: Barbara Kaiser   04/06/24 1445  Weight: 93.4 kg   Constitutional: NAD, calm, comfortable Eyes: PERRL, lids and conjunctivae normal ENMT: Mucous membranes are  moist. Posterior pharynx clear of any exudate or lesions.Normal dentition.  Neck: normal, supple, no masses, no thyromegaly Respiratory: clear to auscultation bilaterally, no wheezing, no crackles. Normal respiratory effort. No accessory muscle use.  Cardiovascular: Regular rate and rhythm, no murmurs / rubs / gallops. No extremity edema. 2+ pedal pulses. No carotid bruits.  Abdomen: no tenderness, no masses palpated. No hepatosplenomegaly. Bowel sounds positive.  Musculoskeletal: no clubbing / cyanosis. No joint deformity upper and lower extremities.  Good ROM, no contractures. Normal muscle tone.  Skin: no rashes, lesions, ulcers. No induration Neurologic: CN 2-12 grossly intact. Sensation intact, DTR normal. Strength 5/5 x all 4 extremities.  Psychiatric: Normal judgment and insight. Alert and oriented x 3. Normal mood.    Condition at discharge: good  The results of significant diagnostics from this hospitalization (including imaging, microbiology, ancillary and laboratory) are listed below for reference.   Imaging Studies: EP STUDY Result Date: 04/08/2024 See surgical note for result.  MR BRAIN WO CONTRAST Result Date: 04/06/2024 EXAM: MR Brain without Intravenous Contrast. CLINICAL HISTORY: Neuro deficit, acute, stroke suspected. She was discharged from the stroke service yesterday after presenting with right hand weakness and numbness which had previously resolved. She was found to have small acute infarcts in the left MCA territory that was felt to be due to an embolic; cryptogenic source. TTE at that time showed no intracardiac clot. TECHNIQUE: Magnetic resonance images of the brain without intravenous contrast in multiple planes. CONTRAST: Without. COMPARISON: None provided. FINDINGS: BRAIN: Acute right basal ganglia perforator infarct. Many small acute infarcts in the left MCA territory including in the left frontal and parietal lobes. Moderate t2/flair hyperintensities in the white matter, compatible with chronic microvascular ischemic change. No intracranial mass or hemorrhage. No midline shift or extra-axial fluid collection. No cerebellar tonsillar ectopia. The central arterial and venous flow voids are patent. VENTRICLES: No hydrocephalus. ORBITS: The orbits are normal. SINUSES AND MASTOIDS: The sinuses and mastoid air cells are clear. BONES: No acute fracture or focal osseous lesion. IMPRESSION: 1. Many small acute infarcts in the left MCA territory. 2. Acute right basal ganglia perforator infarct. Electronically signed by: Gilmore Molt MD 04/06/2024 08:02 PM EDT RP Workstation: [id number]   CT HEAD CODE STROKE WO CONTRAST Result Date: 04/06/2024 CLINICAL DATA:  Provided history: Code stroke. Neuro deficit, acute, stroke suspected. EXAM: CT HEAD WITHOUT CONTRAST TECHNIQUE: Contiguous axial images were obtained from the base of the skull through the vertex without intravenous contrast. RADIATION DOSE REDUCTION: This exam was performed according to the departmental dose-optimization program which includes automated exposure control, adjustment of the mA and/or kV according to patient size and/or use of iterative reconstruction technique. COMPARISON:  Brain MRI 04/04/2024. Non-contrast head CT and CT angiogram head/neck 04/04/2024.A FINDINGS: Brain: No age-advanced or lobar predominant cerebral atrophy. Known patchy acute left MCA territory infarcts, overall better appreciated on the recent prior brain MRI of 04/04/2024. As before, some of these infarcts involve the pre and postcentral gyri. No CT evidence of an interval acute intracranial abnormality. Small chronic cortical infarct within the left parietal lobe, better appreciated on the prior MRI. Unchanged chronic lacunar infarct within the left corona radiata. Moderate patchy and ill-defined hypoattenuation elsewhere within the cerebral white matter, nonspecific but compatible with chronic small vessel ischemic disease. There is no acute intracranial hemorrhage. No extra-axial fluid collection. No evidence of an intracranial mass. No midline shift. Vascular: No hyperdense vessel.  Atherosclerotic calcifications. Skull: No calvarial fracture or aggressive osseous lesion. Sinuses/Orbits: No orbital  mass or acute orbital finding. Pansinusitis, not significant changed at the imaged levels. ASPECTS [facility] Stroke Program Early CT Score) - Ganglionic level infarction (caudate, lentiform nuclei, internal capsule, insula, M1-M3 cortex): 7 - Supraganglionic infarction (M4-M6 cortex): 1 Total  score (0-10 with 10 being normal): 8 No evidence of an interval acute intracranial abnormality. These results were communicated to Dr. Matthews at 2:05 pmon 10/7/2025by text page via the [facility] messaging system. IMPRESSION: 1. Known patchy acute left MCA territory infarcts, overall better appreciated on the recent prior brain MRI of 04/04/2024. 2. No evidence of an interval acute intracranial abnormality 3. Background chronic small vessel ischemic disease and chronic infarcts, as described. Electronically Signed   By: Rockey Childs D.O.   On: 04/06/2024 14:06   ECHOCARDIOGRAM COMPLETE Result Date: 04/05/2024    ECHOCARDIOGRAM REPORT   Patient Name:   Barbara Kaiser Date of Exam: 04/05/2024 Medical Rec #:  [id number]      Height:       67.0 in Accession #:    [id number]     Weight:       205.9 lb Date of Birth:  [birth year]      BSA:          2.047 m Patient Age:    67 years       BP:           159/81 mmHg Patient Gender: F              HR:           73 bpm. Exam Location:  Inpatient Procedure: 2D Echo, Cardiac Doppler and Color Doppler (Both Spectral and Color            Flow Doppler were utilized during procedure). Indications:    TIA G45.9  History:        Patient has no prior history of Echocardiogram examinations.                 Risk Factors:Hypertension.  Sonographer:    Jayson Gaskins Referring Phys: [id number] JUSTIN B HOWERTER IMPRESSIONS  1. Left ventricular ejection fraction, by estimation, is 65 to 70%. The left ventricle has normal function. The left ventricle has no regional wall motion abnormalities. There is mild left ventricular hypertrophy. Left ventricular diastolic parameters are consistent with Grade I diastolic dysfunction (impaired relaxation).  2. Right ventricular systolic function is normal. The right ventricular size is normal.  3. The mitral valve is normal in structure. No evidence of mitral valve regurgitation. No evidence of mitral stenosis.  4. The aortic valve is tricuspid. Aortic valve  regurgitation is not visualized. No aortic stenosis is present.  5. The inferior vena cava is normal in size with greater than 50% respiratory variability, suggesting right atrial pressure of 3 mmHg. Conclusion(s)/Recommendation(s): No intracardiac source of embolism detected on this transthoracic study. Consider a transesophageal echocardiogram to exclude cardiac source of embolism if clinically indicated. FINDINGS  Left Ventricle: Left ventricular ejection fraction, by estimation, is 65 to 70%. The left ventricle has normal function. The left ventricle has no regional wall motion abnormalities. The left ventricular internal cavity size was normal in size. There is  mild left ventricular hypertrophy. Left ventricular diastolic parameters are consistent with Grade I diastolic dysfunction (impaired relaxation). Right Ventricle: The right ventricular size is normal. No increase in right ventricular wall thickness. Right ventricular systolic function is normal. Left Atrium: Left atrial size was normal in size. Right Atrium: Right atrial size was normal in size.  Pericardium: There is no evidence of pericardial effusion. Mitral Valve: The mitral valve is normal in structure. No evidence of mitral valve regurgitation. No evidence of mitral valve stenosis. Tricuspid Valve: The tricuspid valve is normal in structure. Tricuspid valve regurgitation is not demonstrated. No evidence of tricuspid stenosis. Aortic Valve: The aortic valve is tricuspid. Aortic valve regurgitation is not visualized. No aortic stenosis is present. Aortic valve mean gradient measures 5.0 mmHg. Aortic valve peak gradient measures 7.4 mmHg. Aortic valve area, by VTI measures 3.12 cm. Pulmonic Valve: The pulmonic valve was normal in structure. Pulmonic valve regurgitation is not visualized. No evidence of pulmonic stenosis. Aorta: The aortic root is normal in size and structure. Venous: The inferior vena cava is normal in size with greater than 50%  respiratory variability, suggesting right atrial pressure of 3 mmHg. IAS/Shunts: No atrial level shunt detected by color flow Doppler.  LEFT VENTRICLE PLAX 2D LVIDd:         3.60 cm   Diastology LVIDs:         2.20 cm   LV e' medial:    5.00 cm/s LV PW:         1.10 cm   LV E/e' medial:  9.5 LV IVS:        1.20 cm   LV e' lateral:   8.05 cm/s LVOT diam:     1.90 cm   LV E/e' lateral: 5.9 LV SV:         63 LV SV Index:   31 LVOT Area:     2.84 cm  RIGHT VENTRICLE RV S prime:     16.50 cm/s TAPSE (M-mode): 2.5 cm LEFT ATRIUM             Index        RIGHT ATRIUM           Index LA Vol (A2C):   33.7 ml 16.46 ml/m  RA Area:     18.40 cm LA Vol (A4C):   40.0 ml 19.54 ml/m  RA Volume:   50.50 ml  24.67 ml/m LA Biplane Vol: 40.3 ml 19.68 ml/m  AORTIC VALVE AV Area (Vmax):    2.56 cm AV Area (Vmean):   2.43 cm AV Area (VTI):     3.12 cm AV Vmax:           136.00 cm/s AV Vmean:          102.000 cm/s AV VTI:            0.201 m AV Peak Grad:      7.4 mmHg AV Mean Grad:      5.0 mmHg LVOT Vmax:         123.00 cm/s LVOT Vmean:        87.300 cm/s LVOT VTI:          0.221 m LVOT/AV VTI ratio: 1.10  AORTA Ao Root diam: 2.80 cm MITRAL VALVE MV Area (PHT): 2.51 cm    SHUNTS MV Decel Time: 302 msec    Systemic VTI:  0.22 m MV E velocity: 47.70 cm/s  Systemic Diam: 1.90 cm MV A velocity: 85.40 cm/s MV E/A ratio:  0.56 Oneil Parchment MD Electronically signed by Oneil Parchment MD Signature Date/Time: 04/05/2024/4:37:57 PM    Final    MR BRAIN WO CONTRAST Result Date: 04/04/2024 EXAM: MRI BRAIN WITHOUT CONTRAST 04/04/2024 07:07:43 PM TECHNIQUE: Multiplanar multisequence MRI of the head/brain was performed without the administration of intravenous contrast. COMPARISON: None available. CLINICAL HISTORY:  Right hand weakness and numbness. FINDINGS: BRAIN AND VENTRICLES: Multifocal acute ischemia within the left MCA territory predominantly affecting the precentral and postcentral gyri. Multifocal hyperintense T2-weighted signal within  the cerebral white matter, most commonly due to chronic small vessel disease. Mild volume loss. No intracranial hemorrhage. No mass. No midline shift. No hydrocephalus. The sella is unremarkable. Normal flow voids. ORBITS: No acute abnormality. SINUSES AND MASTOIDS: Chronic pansinusitis. BONES AND SOFT TISSUES: Normal marrow signal. No acute soft tissue abnormality. IMPRESSION: 1. Multifocal acute ischemia within the left MCA territory predominantly affecting the precentral and postcentral gyri. 2. Multifocal hyperintense T2-weighted signal within the cerebral white matter, most commonly due to chronic small vessel disease. 3. Mild volume loss. 4. Chronic pansinusitis. Electronically signed by: Franky Stanford MD 04/04/2024 07:52 PM EDT RP Workstation: [id number]   CT ANGIO HEAD NECK W WO CM Result Date: 04/04/2024 CLINICAL DATA:  Provided history: Neuro deficit, acute, stroke suspected. Right hand weakness and numbness. EXAM: CT ANGIOGRAPHY HEAD AND NECK WITH AND WITHOUT CONTRAST TECHNIQUE: Multidetector CT imaging of the head and neck was performed using the standard protocol during bolus administration of intravenous contrast. Multiplanar CT image reconstructions and MIPs were obtained to evaluate the vascular anatomy. Carotid stenosis measurements (when applicable) are obtained utilizing NASCET criteria, using the distal internal carotid diameter as the denominator. RADIATION DOSE REDUCTION: This exam was performed according to the departmental dose-optimization program which includes automated exposure control, adjustment of the mA and/or kV according to patient size and/or use of iterative reconstruction technique. CONTRAST:  75mL OMNIPAQUE  IOHEXOL  350 MG/ML SOLN COMPARISON:  Same-day brain MRI 04/04/2024. Head CT 01/27/2024. CT angiogram head 07/21/2014. FINDINGS: CT HEAD FINDINGS Brain: No age-advanced or lobar predominant cerebral atrophy. Patchy acute (predominantly cortical) MCA territory infarcts within  the left frontal and parietal lobes, better appreciated on the same-day brain MRI. Notable involvement of the pre and postcentral gyri. Small chronic cortical infarct within the left parietal lobe. Chronic lacunar infarct within the left corona radiata. Moderate patchy and ill-defined hypoattenuation elsewhere within the cerebral white matter, nonspecific but compatible with chronic small vessel ischemic disease. There is no acute intracranial hemorrhage. No extra-axial fluid collection. No evidence of an intracranial mass. No midline shift. Vascular: No hyperdense vessel.  Atherosclerotic calcifications. Skull: No calvarial fracture or aggressive osseous lesion. Sinuses/Orbits: No orbital mass or acute orbital finding. Postsurgical appearance of the paranasal sinuses. Near complete opacification of the right frontal sinus. Extensive opacification of the left frontal sinus. Mild bilateral ethmoid sinus disease. Severe right sphenoid sinusitis. Severe left sphenoid sinusitis (with complete sinus opacification). Severe right maxillary sinusitis. Mild mucosal thickening within the left maxillary sinus. Review of the MIP images confirms the above findings CTA NECK FINDINGS Aortic arch: Common origin of the innominate and left common carotid arteries. Atherosclerotic plaque within the aortic arch and proximal major branch vessels of the neck. Streak/beam hardening artifact arising from a dense contrast bolus partially obscures the left subclavian artery. Within this limitation, there is no appreciable hemodynamically significant innominate or proximal subclavian artery stenosis. Right carotid system: CCA and ICA patent within the neck without measurable stenosis. Atherosclerotic plaque within the mid and distal common carotid artery, about the carotid bifurcation and within the proximal ICA. Partially retropharyngeal course of the cervical ICA. Left carotid system: CCA and ICA patent within the neck without measurable  stenosis. Atherosclerotic plaque about the carotid bifurcation and within the proximal ICA. Vertebral arteries: Codominant patent within the neck. Severe atherosclerotic narrowing at the right vertebral  artery origin. Skeleton: Nonspecific reversal of the expected cervical lordosis. Mild grade 1 anterolisthesis at C4-C5 and C7-T1. Cervical spondylosis. Other neck: No neck mass or cervical lymphadenopathy. Upper chest: No consolidation within the imaged lung apices. Review of the MIP images confirms the above findings CTA HEAD FINDINGS Anterior circulation: The intracranial internal carotid arteries are patent. Atherosclerotic plaque within both vessels with no more than mild stenosis. The M1 middle cerebral arteries are patent. Atherosclerotic irregularity of the M2 and more distal MCA branches bilaterally. Most notably, there is a severe stenosis within a mid-to-distal left MCA branch (series 18, image 30). No M2 proximal branch occlusion is identified. The anterior cerebral arteries are patent. No intracranial aneurysm is identified. Posterior circulation: The intracranial vertebral arteries are patent. The basilar artery is patent. The posterior cerebral arteries are patent. Atherosclerotic irregularity of both vessels most notably as follows. Moderate-to-severe stenosis within the right posterior cerebral artery at the P2/P3 junction (series 16, image 19). Severe stenosis within the left posterior cerebral artery at the P4 segment level (series 16, image 19). Hypoplastic right P1 segment with sizable right posterior communicating artery. The left posterior communicating artery is diminutive or absent. Venous sinuses: Within the limitations of contrast timing, no convincing thrombus. Anatomic variants: As described Review of the MIP images confirms the above findings IMPRESSION: Non-contrast head CT: 1. Patchy acute (predominantly cortical) middle cerebral artery territory infarcts within the left frontal and  parietal lobes, better delineated on the same-day brain MRI. Notable involvement of the pre and postcentral gyri. 2. Background chronic small vessel ischemic disease and chronic infarcts, as described. 3. Extensive paranasal sinus disease as outlined. CTA neck: 1. The common carotid and internal carotid arteries are patent in the neck without stenosis. Atherosclerotic plaque bilaterally, as described. 2. Vertebral arteries patent within the neck. Severe atherosclerotic narrowing of the right vertebral artery origin. 3. Aortic Atherosclerosis (ICD10-I70.0). CTA head: 1. No proximal intracranial large vessel occlusion is identified. 2. Intracranial atherosclerotic disease with multifocal stenoses, most notably as follows. 3. Severe stenosis within a mid-to-distal left middle cerebral artery branch. 4. Moderate-to-severe stenosis within the right posterior cerebral artery at the P2/P3 junction. 5. Severe stenosis within the left posterior cerebral artery at the P4 segment level. Electronically Signed   By: Rockey Childs D.O.   On: 04/04/2024 19:47    Microbiology: Results for orders placed or performed during the hospital encounter of 02/12/22  Surgical pcr screen     Status: None   Collection Time: 02/12/22  3:08 PM   Specimen: Nasal Mucosa; Nasal Swab  Result Value Ref Range Status   MRSA, PCR NEGATIVE NEGATIVE Final   Staphylococcus aureus NEGATIVE NEGATIVE Final    Comment: (NOTE) The Xpert SA Assay (FDA approved for NASAL specimens in patients 34 years of age and older), is one component of a comprehensive surveillance program. It is not intended to diagnose infection nor to guide or monitor treatment. Performed at [facility], [address] [place], [place] [address]     Labs: CBC: Recent Labs  Lab 04/04/24 1717 04/05/24 0330 04/06/24 1454 04/06/24 1501 04/07/24 0252 04/08/24 0322  WBC 10.5 7.4 6.6  --  7.6 7.1  NEUTROABS 6.1 3.6 4.0  --   --   --   HGB 14.2 13.5 14.5 14.6  14.2 14.4  HCT 44.8 42.5 44.8 43.0 43.2 44.2  MCV 89.1 89.3 88.0  --  88.0 87.7  PLT 278 279 283  --  300 281  Basic Metabolic Panel: Recent Labs  Lab 04/04/24 1717 04/05/24 0330 04/06/24 1454 04/06/24 1501 04/07/24 0252 04/08/24 0322  NA 139 139 139 142 137 139  K 3.4* 3.8 3.7 3.7 3.3* 3.8  CL 106 107 106 106 104 107  CO2 23 21* 21*  --  23 22  GLUCOSE 134* 111* 102* 101* 106* 111*  BUN 11 10 10 11 12 12   CREATININE 0.71 0.65 0.66 0.70 0.70 0.82  CALCIUM  9.3 8.9 9.3  --  9.1 9.1  MG 2.2 2.1  --   --   --   --    Liver Function Tests: Recent Labs  Lab 04/04/24 1717 04/05/24 0330 04/06/24 1454  AST 24 16 17   ALT 16 15 14   ALKPHOS 84 82 84  BILITOT 0.8 0.7 0.7  PROT 7.1 6.3* 6.6  ALBUMIN 3.9 3.4* 3.7   CBG: Recent Labs  Lab 04/06/24 1349  GLUCAP 85    Discharge time spent: 42 minutes.  Signed: Deliliah Room, MD [facility] 04/08/2024

## 2024-04-08 NOTE — Transfer of Care (Signed)
 Immediate Anesthesia Transfer of Care Note  Patient: Barbara Kaiser  Procedure(s) Performed: TRANSESOPHAGEAL ECHOCARDIOGRAM  Patient Location: PACU and Cath Lab  Anesthesia Type:MAC  Level of Consciousness: drowsy  Airway & Oxygen Therapy: Patient Spontanous Breathing and Patient connected to face mask oxygen  Post-op Assessment: Report given to RN and Post -op Vital signs reviewed and stable  Post vital signs: Reviewed and stable  Last Vitals:  Vitals Value Taken Time  BP 110/86 04/08/24 12:47  Temp 36.5 C 04/08/24 12:47  Pulse 76 04/08/24 12:51  Resp 17 04/08/24 12:51  SpO2 97 % 04/08/24 12:51  Vitals shown include unfiled device data.  Last Pain:  Vitals:   04/08/24 1247  TempSrc: Tympanic  PainSc: Asleep         Complications: No notable events documented.

## 2024-04-09 ENCOUNTER — Encounter (HOSPITAL_COMMUNITY): Payer: Self-pay | Admitting: Internal Medicine

## 2024-04-12 ENCOUNTER — Encounter

## 2024-04-12 ENCOUNTER — Ambulatory Visit

## 2024-04-15 ENCOUNTER — Ambulatory Visit

## 2024-04-15 ENCOUNTER — Encounter: Admitting: Occupational Therapy

## 2024-04-19 ENCOUNTER — Ambulatory Visit

## 2024-04-19 ENCOUNTER — Encounter: Admitting: Occupational Therapy

## 2024-04-21 ENCOUNTER — Ambulatory Visit: Admitting: Physical Therapy

## 2024-04-21 ENCOUNTER — Encounter

## 2024-04-26 ENCOUNTER — Ambulatory Visit

## 2024-04-26 ENCOUNTER — Encounter: Admitting: Occupational Therapy

## 2024-04-28 ENCOUNTER — Ambulatory Visit: Admitting: Physical Therapy

## 2024-04-28 ENCOUNTER — Encounter: Admitting: Occupational Therapy

## 2024-05-03 ENCOUNTER — Encounter: Admitting: Occupational Therapy

## 2024-05-03 ENCOUNTER — Ambulatory Visit: Admitting: Physical Therapy

## 2024-05-05 ENCOUNTER — Encounter

## 2024-05-05 ENCOUNTER — Ambulatory Visit

## 2024-05-10 NOTE — Progress Notes (Unsigned)
 [facility] [address] [place]. [place] [address] [phone]       HOSPITAL FOLLOW UP NOTE  [name] Date of Birth:  [birth year] Medical Record Number:  [id number]   Reason for Referral:  hospital stroke follow up    SUBJECTIVE:   CHIEF COMPLAINT:  No chief complaint on file.   HPI:   [name] is a 68 y.o. female with history of HTN, HLD, asthma who presented to ED on 11/18/2023 with transient bilateral hand numbness and right hand weakness.  Stroke workup revealed multiple small left MCA strokes, suspected cardioembolic of unclear source.  CTA head/neck showed L MCA occlusion and diffuse intracranial stenosis.  LDL 123.  A1c 5.4.  Recommended DAPT for 3 weeks then aspirin alone and added Crestor 10 mg daily.  Recommended outpatient cardiac monitor to evaluate for A-fib.  She was discharged home without therapy needs.  She returned to ED on 10/7 with reoccurring right sided finger numbness and tingling and right sided tongue numbness.  Repeat MRI showed acute right BG infarct which was felt to be asymptomatic.  Suspected recurrent symptoms from recent stroke as it is not uncommon for symptoms to wax/wane poststroke.  It was recommended to start aspirin and Brilinta for 4 weeks then aspirin alone.       PERTINENT IMAGING  04/04/2024 Code Stroke CT head no acute abnormality, multilevel degenerative disc disease and facet arthrosis resulting in multilevel neuroforaminal narrowing, severe on the right at C3-4 and the left at C4-5. ASPECTS 10.   CTA head & neck: No proximal intracranial LVO is identified. Severe stenosis within a mid-to-distal left middle cerebral artery branch.  Moderate-to-severe stenosis within the right posterior cerebral artery at the P2/P3 junction. Severe stenosis within the left posterior cerebral artery at the P4 segment level. MRI: Multifocal acute ischemia within the left MCA territory predominantly affecting the  precentral and postcentral gyri.  2D Echo pending results LDL 123 HgbA1c 5.4  04/06/2024 Code Stroke CT head:  Known patchy acute left MCA territory infarcts, overall better appreciated on the recent prior brain MRI of 04/04/2024. No evidence of an interval acute intracranial abnormality. Background chronic small vessel ischemic disease and chronic infarcts, as described. MRI: Acute right basal ganglia perforator infarct. Many small acute infarcts in the left MCA territory.     ROS:   14 system review of systems performed and negative with exception of ***  PMH:  Past Medical History:  Diagnosis Date   Allergic rhinitis    Anemia, iron deficiency    Colon polyps 3/10   Complication of anesthesia    hard to wake   Hypertension    PONV (postoperative nausea and vomiting)    Uterine fibroid    with heavy menses and anemia (resolved by ablation)    PSH:  Past Surgical History:  Procedure Laterality Date   ABDOMINAL HYSTERECTOMY     CARDIAC CATHETERIZATION  05/2005   mild CAD   DILATION AND CURETTAGE OF UTERUS  12/08   hysteroscopy and endometrial ablation   ETHMOIDECTOMY Bilateral 06/18/2019   Procedure: ETHMOIDECTOMY;  Surgeon: Karis Clunes, MD;  Location: [facility];  Service: ENT;  Laterality: Bilateral;   FRONTAL SINUS EXPLORATION Bilateral 06/18/2019   Procedure: FRONTAL SINUS EXPLORATION;  Surgeon: Karis Clunes, MD;  Location: [facility];  Service: ENT;  Laterality: Bilateral;   LAPAROSCOPIC SUPRACERVICAL HYSTERECTOMY  12/09/2011   Procedure: LAPAROSCOPIC SUPRACERVICAL HYSTERECTOMY;  Surgeon: Gloris DELENA Hugger, MD;  Location:  [facility];  Service: Gynecology;  Laterality: N/A;   MAXILLARY ANTROSTOMY Bilateral 06/18/2019   Procedure: MAXILLARY ANTROSTOMY;  Surgeon: Karis Clunes, MD;  Location: [facility];  Service: ENT;  Laterality: Bilateral;   NASAL SINUS SURGERY  11/07   SALPINGOOPHORECTOMY  12/09/2011   Procedure: SALPINGO OOPHERECTOMY;   Surgeon: Gloris DELENA Hugger, MD;  Location: [facility];  Service: Gynecology;  Laterality: Bilateral;   SINUS ENDO WITH FUSION Bilateral 06/18/2019   Procedure: SINUS ENDO WITH FUSION;  Surgeon: Karis Clunes, MD;  Location: [facility];  Service: ENT;  Laterality: Bilateral;   SPHENOIDECTOMY Bilateral 06/18/2019   Procedure: SPHENOIDECTOMY WITH TISSUE REMOVAL;  Surgeon: Karis Clunes, MD;  Location: [facility];  Service: ENT;  Laterality: Bilateral;   SUPRACERVICAL ABDOMINAL HYSTERECTOMY  12/09/2011   Procedure: HYSTERECTOMY SUPRACERVICAL ABDOMINAL;  Surgeon: Gloris DELENA Hugger, MD;  Location: [facility];  Service: Gynecology;  Laterality: N/A;   TRANSESOPHAGEAL ECHOCARDIOGRAM (CATH LAB) N/A 04/08/2024   Procedure: TRANSESOPHAGEAL ECHOCARDIOGRAM;  Surgeon: Cherrie Toribio SAUNDERS, MD;  Location: [facility];  Service: Cardiovascular;  Laterality: N/A;   TUBAL LIGATION     TURBINATE REDUCTION Bilateral 06/18/2019   Procedure: TURBINATE REDUCTION;  Surgeon: Karis Clunes, MD;  Location:  SURGERY CENTER;  Service: ENT;  Laterality: Bilateral;    Social History:  Social History   Socioeconomic History   Marital status: Single    Spouse name: Not on file   Number of children: 2   Years of education: Not on file   Highest education level: Not on file  Occupational History    Employer: [employer]  Tobacco Use   Smoking status: Former    Current packs/day: 0.00    Average packs/day: 0.3 packs/day for 24.0 years (6.0 ttl pk-yrs)    Types: Cigarettes    Start date: 07/01/1977    Quit date: 07/01/2001    Years since quitting: 22.8   Smokeless tobacco: Never  Vaping Use   Vaping status: Never Used  Substance and Sexual Activity   Alcohol use: No    Alcohol/week: 0.0 standard drinks of alcohol   Drug use: No   Sexual activity: Not Currently    Partners: Male    Birth control/protection: Surgical    Comment: tubalization.  Other Topics Concern   Not on file  Social  History Narrative   Treadmill for exercise   Social Drivers of Health   Financial Resource Strain: Low Risk  (08/29/2023)   Received from [facility]   Overall Financial Resource Strain (CARDIA)    Difficulty of Paying Living Expenses: Not hard at all  Food Insecurity: No Food Insecurity (04/07/2024)   Hunger Vital Sign    Worried About Running Out of Food in the Last Year: Never true    Ran Out of Food in the Last Year: Never true  Transportation Needs: No Transportation Needs (04/07/2024)   PRAPARE - Administrator, Civil Service (Medical): No    Lack of Transportation (Non-Medical): No  Physical Activity: Not on file  Stress: Not on file  Social Connections: Unknown (04/07/2024)   Social Connection and Isolation Panel    Frequency of Communication with Friends and Family: More than three times a week    Frequency of Social Gatherings with Friends and Family: More than three times a week    Attends Religious Services: Not on file    Active Member of Clubs or Organizations: Not on file  Attends Banker Meetings: More than 4 times per year    Marital Status: Married  Catering Manager Violence: Not At Risk (04/07/2024)   Humiliation, Afraid, Rape, and Kick questionnaire    Fear of Current or Ex-Partner: No    Emotionally Abused: No    Physically Abused: No    Sexually Abused: No    Family History:  Family History  Problem Relation Age of Onset   Hypertension Mother    Cancer Mother 66       uterine   Hypertension Father    Heart disease Other        MI   Diabetes Other    Stroke Other    Heart disease Brother        age 41 and 57   Cancer Other        lung   Breast cancer Neg Hx     Medications:   Current Outpatient Medications on File Prior to Visit  Medication Sig Dispense Refill   aspirin EC 81 MG tablet Take 1 tablet (81 mg total) by mouth daily. Swallow whole. 90 tablet 0   ezetimibe (ZETIA) 10 MG tablet Take 1 tablet  by mouth daily.     fluticasone  (FLONASE ) 50 MCG/ACT nasal spray Place 2 sprays into both nostrils daily. (Patient taking differently: Place 2 sprays into both nostrils daily as needed for allergies.) 16 g 6   methocarbamol (ROBAXIN) 500 MG tablet Take 500 mg by mouth as needed for muscle spasms.     metoprolol  tartrate (LOPRESSOR ) 25 MG tablet Take 1 tablet (25 mg total) by mouth 2 (two) times daily. 180 tablet 0   Misc Natural Products (PUMPKIN SEED OIL PO) Take 1 tablet by mouth 1 day or 1 dose.     ondansetron  (ZOFRAN -ODT) 4 MG disintegrating tablet Take 1 tablet (4 mg total) by mouth every 6 (six) hours as needed for nausea or vomiting. 20 tablet 0   rosuvastatin (CRESTOR) 10 MG tablet Take 1 tablet (10 mg total) by mouth daily. 90 tablet 0   No current facility-administered medications on file prior to visit.    Allergies:  No Known Allergies    OBJECTIVE:  Physical Exam  There were no vitals filed for this visit. There is no height or weight on file to calculate BMI. No results found.   General: well developed, well nourished, seated, in no evident distress Head: head normocephalic and atraumatic.   Neck: supple with no carotid or supraclavicular bruits Cardiovascular: regular rate and rhythm, no murmurs Musculoskeletal: no deformity Skin:  no rash/petichiae Vascular:  Normal pulses all extremities   Neurologic Exam Mental Status: Awake and fully alert. Oriented to place and time. Recent and remote memory intact. Attention span, concentration and fund of knowledge appropriate. Mood and affect appropriate.  Cranial Nerves: Fundoscopic exam reveals sharp disc margins. Pupils equal, briskly reactive to light. Extraocular movements full without nystagmus. Visual fields full to confrontation. Hearing intact. Facial sensation intact. Face, tongue, palate moves normally and symmetrically.  Motor: Normal bulk and tone. Normal strength in all tested extremity muscles Sensory.:  intact to touch , pinprick , position and vibratory sensation.  Coordination: Rapid alternating movements normal in all extremities. Finger-to-nose and heel-to-shin performed accurately bilaterally. Gait and Station: Arises from chair without difficulty. Stance is normal. Gait demonstrates normal stride length and balance with ***. Tandem walk and heel toe ***.  Reflexes: 1+ and symmetric. Toes downgoing.     NIHSS  *** Modified Rankin  ***  ASSESSMENT: Kenia Lott is a 68 y.o. year old female multiple left MCA territory strokes on 04/04/2024 secondary to unclear source. Found to have likely asymptomatic R BG stroke 10/7 after presenting with recurrent right hand and tongue numbness, likely effect from prior recent stroke. Vascular risk factors include HTN, HLD, intracranial stenosis, obesity and CAD.      PLAN:  Cryptogenic left MCA stroke: R BG stroke, asymptomatic: Residual deficit: ***.  Continue aspirin 81mg  daily and rosuvastatin (Crestor) for secondary stroke prevention managed/prescribed by PCP.  Completed 4-week course of Brilinta therapy. Discussed secondary stroke prevention measures and importance of close PCP follow up for aggressive stroke risk factor management including BP goal<130/90, HLD with LDL goal<70 and DM with A1c.<7 .  Stroke labs 03/2024: LDL 123, A1c 5.4 I have gone over the pathophysiology of stroke, warning signs and symptoms, risk factors and their management in some detail with instructions to go to the closest emergency room for symptoms of concern.     Follow up in *** or call earlier if needed   CC:  [facility] provider: Dr. Rosemarie PCP: Bertrum Charlie CROME, MD    I personally spent a total of *** minutes in the care of the patient today including {Time Based Coding:[id number]}.    Harlene Bogaert, AGNP-BC  [facility] [address] [place], [place] [address]  Phone [phone] Fax [phone] Note: This  document was prepared with digital dictation and possible smart phrase technology. Any transcriptional errors that result from this process are unintentional.

## 2024-05-11 ENCOUNTER — Encounter: Payer: Self-pay | Admitting: Adult Health

## 2024-05-11 ENCOUNTER — Ambulatory Visit: Admitting: Adult Health

## 2024-05-11 ENCOUNTER — Encounter: Admitting: Occupational Therapy

## 2024-05-11 ENCOUNTER — Ambulatory Visit

## 2024-05-11 VITALS — BP 131/80 | HR 68 | Ht 67.0 in | Wt 182.2 lb

## 2024-05-11 DIAGNOSIS — F064 Anxiety disorder due to known physiological condition: Secondary | ICD-10-CM

## 2024-05-11 DIAGNOSIS — I63412 Cerebral infarction due to embolism of left middle cerebral artery: Secondary | ICD-10-CM

## 2024-05-11 DIAGNOSIS — Z09 Encounter for follow-up examination after completed treatment for conditions other than malignant neoplasm: Secondary | ICD-10-CM

## 2024-05-11 DIAGNOSIS — Z9189 Other specified personal risk factors, not elsewhere classified: Secondary | ICD-10-CM

## 2024-05-11 DIAGNOSIS — I6381 Other cerebral infarction due to occlusion or stenosis of small artery: Secondary | ICD-10-CM

## 2024-05-11 NOTE — Progress Notes (Deleted)
 [facility]:    Date:  05/11/2024   ID:  Barbara Kaiser, DOB February 16, 1956, MRN [id number]  PCP:  Bertrum Charlie CROME, MD  Cardiologist:  None { Click to update primary MD,subspecialty MD or APP then REFRESH:1}    Referring MD: Bertrum Charlie CROME, MD   Chief Complaint: follow-up of recent CVA/ review Event Monitor   History of Present Illness:    Barbara Kaiser is a 68 y.o. 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   Patient has a history of non-obstructive CAD noted on remote cardiac catheterization in 2006. She was seen by Dr. Gollan in 2017 for pre-op evaluation for a hysterectomy. She was doing well from a cardiac standpoint and felt to be at acceptable risk for surgery at that time. She was lost to follow-up after this.   She was recently admitted from 04/04/2024 to 04/05/2024 for an acute stroke in the left MCA territory. Echo showed LVEF of 65-70% with mild LVH and grade 1 diastolic dysfunction, normal RV function, and no significant valvular disease. She was seen by Neurology and recommendation was for DAPT with Aspirin and Plavix for 3 weeks and then Aspirin monotherapy. She was readmitted from 04/06/2024 to 04/08/2024 after presenting with right hand and tongue numbness. Repeat brain MRI showed a new acute right basal ganglia perforator infarct and many small acute infarcts in the left MCA territory including the left frontal and parietal lobes. TEE showed showed LVEF >75% with severe LVH, normal RV function and no significant valvular disease. There was no evidence of left atrial/ left atrial appendage thrombus or interatrial shunt. Neurology recommended stopping Plavix and switching to Aspirin 81mg  daily and Brilitna 90mg  twice daily for 4 weeks and then transitioning to Aspirin  monotherapy. Cardiology was asked to help arrange a 30 day Event Monitor. Monitor showed ***.   Patient presents today to review monitor results. ***   Recent CVA Patient recently admitted twice in 03/2024 for acute CVA. TEE showed LVEF >75% with severe LVH, normal RV function and no significant valvular disease. There was no evidence of left atrial/ left atrial appendage thrombus or interatrial shunt. Event Monitor showed *** - Completed 4 weeks of DAPT with Aspirin and Brilinta and now on Aspirin alone.  - Continue Crestor/ Zetia.   Non-Obstructive CAD Noted on remote cardiac catheterization in 2006.  - No chest pain.  - Continue Aspirin and Crestor/ Zetia.   Severe LVH Recent TEE in 03/2024 showed LVEF of >75% and severe LVH.  - Cardiac MRI ***  Hypertension BP *** - Continue current medications: Triamterene-HCTZ 37.5-25mg  daily and Lopressor  25mg  twice daily.   Hyperlipidemia Lipid panel in 03/2024 at time of first stroke: Total Cholesterol 189, Triglycerides 159, HDL 34, LDL 123. LDL goal <55.  - Currently on Crestor 10mg  daily and Zetia 10mg  daily.  - ***  EKGs/Labs/Other Studies Reviewed:    The following studies were reviewed today:  TTE 04/05/2024: Impressions:  1. Left ventricular ejection fraction, by estimation, is 65 to 70%. The  left ventricle has normal function. The left ventricle has no regional  wall motion abnormalities. There is mild left ventricular hypertrophy.  Left ventricular diastolic parameters  are consistent with Grade I diastolic dysfunction (impaired relaxation).   2. Right ventricular systolic function is normal. The right ventricular  size is normal.   3. The mitral valve is normal in structure. No evidence of mitral valve  regurgitation. No evidence of mitral stenosis.   4. The aortic valve is tricuspid. Aortic valve regurgitation is not  visualized. No aortic stenosis is present.   5. The inferior vena cava is normal in size with greater  than 50%  respiratory variability, suggesting right atrial pressure of 3 mmHg.  _______________  TEE 04/08/2024: Impression: 1. Left ventricular ejection fraction, by estimation, is >75%. The left  ventricle has hyperdynamic function. The left ventricle has no regional  wall motion abnormalities. There is severe concentric left ventricular  hypertrophy.   2. Right ventricular systolic function is normal. The right ventricular  size is normal. Mildly increased right ventricular wall thickness.   3. Left atrial size was mildly dilated. No left atrial/left atrial  appendage thrombus was detected.   4. The mitral valve is normal in structure. Trivial mitral valve  regurgitation. No evidence of mitral stenosis.   5. The aortic valve is tricuspid. Aortic valve regurgitation is trivial.  No aortic stenosis is present.   6. There is mild (Grade II) plaque involving the descending aorta.   7. The inferior vena cava is normal in size with greater than 50%  respiratory variability, suggesting right atrial pressure of 3 mmHg.   8. Agitated saline contrast bubble study was negative, with no evidence  of any interatrial shunt.   Conclusion(s)/Recommendation(s): Normal biventricular function without  evidence of hemodynamically significant valvular heart disease. No LA/LAA  thrombus identified. Negative bubble study for interatrial shunt. No  intracardiac source of embolism detected   on this on this transesophageal echocardiogram.  _______________  Monitor ***  EKG:  EKG not ordered today.   Recent Labs: 04/05/2024: Magnesium 2.1 04/06/2024: ALT 14 04/08/2024: BUN 12; Creatinine, Ser 0.82; Hemoglobin 14.4; Platelets 281; Potassium 3.8; Sodium 139  Recent Lipid Panel    Component Value Date/Time   CHOL 189 04/05/2024 0330   CHOL 210 (H) 08/21/2021 0749   TRIG 159 (H) 04/05/2024 0330   HDL 34 (L) 04/05/2024 0330   HDL 42 08/21/2021 0749   CHOLHDL 5.6 04/05/2024 0330   VLDL 32 04/05/2024  0330   LDLCALC 123 (H) 04/05/2024 0330   LDLCALC 147 (H) 08/21/2021 0749   LDLDIRECT 136.0 01/09/2015 0832    Physical Exam:    Vital Signs: LMP 11/28/2011     Wt Readings from Last 3 Encounters:  05/11/24 182 lb 3.2 oz (82.6 kg)  04/06/24 205 lb 14.6 oz (93.4 kg)  04/04/24 205 lb 14.6 oz (93.4 kg)     General: 68 y.o. female in no acute distress. HEENT: Normocephalic and atraumatic. Sclera clear.  Neck: Supple. No carotid bruits. No JVD. Heart: *** RRR. Distinct S1 and S2. No murmurs, gallops, or rubs.  Lungs: No increased work of breathing. Clear to ausculation bilaterally. No wheezes, rhonchi, or rales.  Abdomen: Soft, non-distended, and non-tender to palpation.  Extremities: No lower extremity edema.  Radial and distal pedal pulses 2+ and equal bilaterally. Skin: Warm and dry. Neuro: No focal deficits. Psych: Normal affect. Responds appropriately.   Assessment:    No diagnosis found.  Plan:     Disposition: Follow up in ***   Signed, Aline FORBES Door, PA-C  05/11/2024 12:37 PM    [facility]

## 2024-05-11 NOTE — Patient Instructions (Signed)
 Continue to work on modifying diet and further weight loss. If you continue to experience fatigue and snoring, we may have to consider a sleep study to evaluate for sleep apnea.   Follow up with cardiology next week to review heart monitor  Continue to monitor your anxiety. This fear is very normal after a stroke and typically does get better overtime. If this persists, please do not hesitate to discuss further with your PCP. Also, joining a stroke survivor support group can also help with this anxiety.   Continue aspirin 81 mg daily, Crestor and Zetia  for secondary stroke prevention  Continue to follow up with PCP regarding blood pressure and cholesterol management  Maintain strict control of hypertension with blood pressure goal below 130/90 and cholesterol with LDL cholesterol (bad cholesterol) goal below 70 mg/dL.   Signs of a Stroke? Follow the BEFAST method:  Balance Watch for a sudden loss of balance, trouble with coordination or vertigo Eyes Is there a sudden loss of vision in one or both eyes? Or double vision?  Face: Ask the person to smile. Does one side of the face droop or is it numb?  Arms: Ask the person to raise both arms. Does one arm drift downward? Is there weakness or numbness of a leg? Speech: Ask the person to repeat a simple phrase. Does the speech sound slurred/strange? Is the person confused ? Time: If you observe any of these signs, call 911.      Thank you for coming to see us  at [facility]. I hope we have been able to provide you high quality care today.  You may receive a patient satisfaction survey over the next few weeks. We would appreciate your feedback and comments so that we may continue to improve ourselves and the health of our patients.  Stroke Prevention Some medical conditions and lifestyle choices can lead to a higher risk for a stroke. You can help to prevent a stroke by eating healthy foods and exercising. It also helps to  not smoke and to manage any health problems you may have. How can this condition affect me? A stroke is an emergency. It should be treated right away. A stroke can lead to brain damage or threaten your life. There is a better chance of surviving and getting better after a stroke if you get medical help right away. What can increase my risk? The following medical conditions may increase your risk of a stroke: Diseases of the heart and blood vessels (cardiovascular disease). High blood pressure (hypertension). Diabetes. High cholesterol. Sickle cell disease. Problems with blood clotting. Being very overweight. Sleeping problems (obstructivesleep apnea). Other risk factors include: Being older than age 22. A history of blood clots, stroke, or mini-stroke (TIA). Race, ethnic background, or a family history of stroke. Smoking or using tobacco products. Taking birth control pills, especially if you smoke. Heavy alcohol and drug use. Not being active. What actions can I take to prevent this? Manage your health conditions High cholesterol. Eat a healthy diet. If this is not enough to manage your cholesterol, you may need to take medicines. Take medicines as told by your doctor. High blood pressure. Try to keep your blood pressure below 130/80. If your blood pressure cannot be managed through a healthy diet and regular exercise, you may need to take medicines. Take medicines as told by your doctor. Ask your doctor if you should check your blood pressure at home. Have your blood pressure checked every year. Diabetes. Eat  a healthy diet and get regular exercise. If your blood sugar (glucose) cannot be managed through diet and exercise, you may need to take medicines. Take medicines as told by your doctor. Talk to your doctor about getting checked for sleeping problems. Signs of a problem can include: Snoring a lot. Feeling very tired. Make sure that you manage any other conditions you  have. Nutrition  Follow instructions from your doctor about what to eat or drink. You may be told to: Eat and drink fewer calories each day. Limit how much salt (sodium) you use to 1,500 milligrams (mg) each day. Use only healthy fats for cooking, such as olive oil, canola oil, and sunflower oil. Eat healthy foods. To do this: Choose foods that are high in fiber. These include whole grains, and fresh fruits and vegetables. Eat at least 5 servings of fruits and vegetables a day. Try to fill one-half of your plate with fruits and vegetables at each meal. Choose low-fat (lean) proteins. These include low-fat cuts of meat, chicken without skin, fish, tofu, beans, and nuts. Eat low-fat dairy products. Avoid foods that: Are high in salt. Have saturated fat. Have trans fat. Have cholesterol. Are processed or pre-made. Count how many carbohydrates you eat and drink each day. Lifestyle If you drink alcohol: Limit how much you have to: 0-1 drink a day for women who are not pregnant. 0-2 drinks a day for men. Know how much alcohol is in your drink. In the [place] one drink equals one 12 oz bottle of beer ( ), one 5 oz glass of wine ( ), or one 1 oz glass of hard liquor (44mL). Do not smoke or use any products that have nicotine or tobacco. If you need help quitting, ask your doctor. Avoid secondhand smoke. Do not use drugs. Activity  Try to stay at a healthy weight. Get at least 30 minutes of exercise on most days, such as: Fast walking. Biking. Swimming. Medicines Take over-the-counter and prescription medicines only as told by your doctor. Avoid taking birth control pills. Talk to your doctor about the risks of taking birth control pills if: You are over 84 years old. You smoke. You get very bad headaches. You have had a blood clot. Where to find more information American Stroke Association: www.strokeassociation.org Get help right away if: You or a loved one has any signs  of a stroke. BE FAST is an easy way to remember the warning signs: B - Balance. Dizziness, sudden trouble walking, or loss of balance. E - Eyes. Trouble seeing or a change in how you see. F - Face. Sudden weakness or loss of feeling of the face. The face or eyelid may droop on one side. A - Arms. Weakness or loss of feeling in an arm. This happens all of a sudden and most often on one side of the body. S - Speech. Sudden trouble speaking, slurred speech, or trouble understanding what people say. T - Time. Time to call emergency services. Write down what time symptoms started. You or a loved one has other signs of a stroke, such as: A sudden, very bad headache with no known cause. Feeling like you may vomit (nausea). Vomiting. A seizure. These symptoms may be an emergency. Get help right away. Call your local emergency services (911 in the [place] Do not wait to see if the symptoms will go away. Do not drive yourself to the hospital. Summary You can help to prevent a stroke by eating healthy, exercising, and not smoking. It  also helps to manage any health problems you have. Do not smoke or use any products that contain nicotine or tobacco. Get help right away if you or a loved one has any signs of a stroke. This information is not intended to replace advice given to you by your health care provider. Make sure you discuss any questions you have with your health care provider. Document Revised: 05/20/2022 Document Reviewed: 05/20/2022 Elsevier Patient Education  2024 [employer].

## 2024-05-13 ENCOUNTER — Encounter

## 2024-05-13 ENCOUNTER — Ambulatory Visit: Admitting: Physical Therapy

## 2024-05-16 NOTE — Progress Notes (Unsigned)
 Cardiology Office Note    Date:  05/17/2024  ID:  Addis Bennie, DOB 12/20/1955, MRN [id number] PCP:  Bertrum Charlie CROME, MD  Cardiologist:  None to establish with Dr. Floretta  Electrophysiologist:  None   Chief Complaint: Hospital follow-up for CVA  History of Present Illness: .   Bettylee Kroening is a 68 y.o. 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   Patient has a history of non-obstructive CAD noted on remote cardiac catheterization in 2006. She was seen by Dr. Gollan in 2017 for pre-op evaluation for a hysterectomy. She was doing well from a cardiac standpoint and felt to be at acceptable risk for surgery at that time. She was lost to follow-up after this.   She was recently admitted from 04/04/2024 to 04/05/2024 for an acute stroke in the left MCA territory. Echo showed LVEF of 65-70% with mild LVH and grade 1 diastolic dysfunction, normal RV function, and no significant valvular disease. She was seen by Neurology and recommendation was for DAPT with Aspirin and Plavix for 3 weeks and then Aspirin monotherapy. She was readmitted from 04/06/2024 to 04/08/2024 after presenting with right hand and tongue numbness. Repeat brain MRI showed a new acute right basal ganglia perforator infarct and many small acute infarcts in the left MCA territory including the left frontal and parietal lobes. TEE showed showed LVEF >75% with severe LVH, normal RV function and no significant valvular disease. There was no evidence of left atrial/ left atrial appendage thrombus or interatrial shunt. Neurology recommended stopping Plavix and switching to Aspirin 81mg  daily and Brilitna 90mg  twice daily for 4 weeks and then transitioning to Aspirin monotherapy. Cardiology was asked to help arrange a 30 day Event Monitor. Event monitor results are not yet  available for review, patient reports that she will return her monitor today or tomorrow.  Today she presents for follow-up.  She reports that she has been doing well overall, denies any chest pain, shortness of breath, lower extremity edema, orthopnea or PND.  She denies any palpitations, presyncope or syncope.  Patient reports that her residuals poststroke have been improving, notes some mild numbness in the right hand that has been improving.  Patient reports that she has finished wearing her cardiac monitor, is currently in a box at home, she plans to mail it either today or tomorrow.  She reports that her blood pressures at home have been well-controlled.  She works in public affairs consultant at [employer], has to walk frequently and tolerates very well.  On further discussion with patient she reports that her mother does have a history of congestive heart failure and has a pacemaker, she is unsure if she has any history of hypertrophic cardiomyopathy.  Patient denies any alcohol or recreational drug use.  She reports that she previously smoked however discontinued use about 18 years ago. ROS: .   Today she denies chest pain, shortness of breath, lower extremity edema, fatigue, palpitations, melena, hematuria, hemoptysis, diaphoresis, weakness, presyncope, syncope, orthopnea, and PND.  All other systems are reviewed and otherwise negative. Studies Reviewed: SABRA   EKG:  EKG is ordered today, personally reviewed, demonstrating  EKG Interpretation Date/Time:  Monday May 17 2024 08:11:52 EST Ventricular Rate:  62 PR Interval:  152 QRS Duration:  100 QT Interval:  414 QTC Calculation: 420 R Axis:   -20  Text Interpretation: Normal sinus rhythm  Left ventricular hypertrophy with repolarization abnormality ( R in aVL , Cornell product , Romhilt-Estes ) Confirmed by Edit Ricciardelli [phone]) on 05/17/2024 8:23:51 AM   CV Studies: Cardiac studies reviewed are outlined and summarized above. Otherwise please see  EMR for full report. Cardiac Studies & Procedures   ______________________________________________________________________________________________     ECHOCARDIOGRAM  ECHOCARDIOGRAM COMPLETE 04/05/2024  Narrative ECHOCARDIOGRAM REPORT    Patient Name:   ELA MOFFAT Date of Exam: 04/05/2024 Medical Rec #:  [id number]      Height:       67.0 in Accession #:    [id number]     Weight:       205.9 lb Date of Birth:  December 28, 1955      BSA:          2.047 m Patient Age:    67 years       BP:           159/81 mmHg Patient Gender: F              HR:           73 bpm. Exam Location:  Inpatient  Procedure: 2D Echo, Cardiac Doppler and Color Doppler (Both Spectral and Color Flow Doppler were utilized during procedure).  Indications:    TIA G45.9  History:        Patient has no prior history of Echocardiogram examinations. Risk Factors:Hypertension.  Sonographer:    Jayson Gaskins Referring Phys: [id number] JUSTIN B HOWERTER  IMPRESSIONS   1. Left ventricular ejection fraction, by estimation, is 65 to 70%. The left ventricle has normal function. The left ventricle has no regional wall motion abnormalities. There is mild left ventricular hypertrophy. Left ventricular diastolic parameters are consistent with Grade I diastolic dysfunction (impaired relaxation). 2. Right ventricular systolic function is normal. The right ventricular size is normal. 3. The mitral valve is normal in structure. No evidence of mitral valve regurgitation. No evidence of mitral stenosis. 4. The aortic valve is tricuspid. Aortic valve regurgitation is not visualized. No aortic stenosis is present. 5. The inferior vena cava is normal in size with greater than 50% respiratory variability, suggesting right atrial pressure of 3 mmHg.  Conclusion(s)/Recommendation(s): No intracardiac source of embolism detected on this transthoracic study. Consider a transesophageal echocardiogram to exclude cardiac source of embolism if  clinically indicated.  FINDINGS Left Ventricle: Left ventricular ejection fraction, by estimation, is 65 to 70%. The left ventricle has normal function. The left ventricle has no regional wall motion abnormalities. The left ventricular internal cavity size was normal in size. There is mild left ventricular hypertrophy. Left ventricular diastolic parameters are consistent with Grade I diastolic dysfunction (impaired relaxation).  Right Ventricle: The right ventricular size is normal. No increase in right ventricular wall thickness. Right ventricular systolic function is normal.  Left Atrium: Left atrial size was normal in size.  Right Atrium: Right atrial size was normal in size.  Pericardium: There is no evidence of pericardial effusion.  Mitral Valve: The mitral valve is normal in structure. No evidence of mitral valve regurgitation. No evidence of mitral valve stenosis.  Tricuspid Valve: The tricuspid valve is normal in structure. Tricuspid valve regurgitation is not demonstrated. No evidence of tricuspid stenosis.  Aortic Valve: The aortic valve is tricuspid. Aortic valve regurgitation is not visualized. No aortic stenosis is present. Aortic valve mean gradient measures 5.0 mmHg. Aortic valve peak gradient measures 7.4 mmHg. Aortic valve area, by VTI measures 3.12 cm.  Pulmonic Valve: The pulmonic valve  was normal in structure. Pulmonic valve regurgitation is not visualized. No evidence of pulmonic stenosis.  Aorta: The aortic root is normal in size and structure.  Venous: The inferior vena cava is normal in size with greater than 50% respiratory variability, suggesting right atrial pressure of 3 mmHg.  IAS/Shunts: No atrial level shunt detected by color flow Doppler.   LEFT VENTRICLE PLAX 2D LVIDd:         3.60 cm   Diastology LVIDs:         2.20 cm   LV e' medial:    5.00 cm/s LV PW:         1.10 cm   LV E/e' medial:  9.5 LV IVS:        1.20 cm   LV e' lateral:   8.05  cm/s LVOT diam:     1.90 cm   LV E/e' lateral: 5.9 LV SV:         63 LV SV Index:   31 LVOT Area:     2.84 cm   RIGHT VENTRICLE RV S prime:     16.50 cm/s TAPSE (M-mode): 2.5 cm  LEFT ATRIUM             Index        RIGHT ATRIUM           Index LA Vol (A2C):   33.7 ml 16.46 ml/m  RA Area:     18.40 cm LA Vol (A4C):   40.0 ml 19.54 ml/m  RA Volume:   50.50 ml  24.67 ml/m LA Biplane Vol: 40.3 ml 19.68 ml/m AORTIC VALVE AV Area (Vmax):    2.56 cm AV Area (Vmean):   2.43 cm AV Area (VTI):     3.12 cm AV Vmax:           136.00 cm/s AV Vmean:          102.000 cm/s AV VTI:            0.201 m AV Peak Grad:      7.4 mmHg AV Mean Grad:      5.0 mmHg LVOT Vmax:         123.00 cm/s LVOT Vmean:        87.300 cm/s LVOT VTI:          0.221 m LVOT/AV VTI ratio: 1.10  AORTA Ao Root diam: 2.80 cm  MITRAL VALVE MV Area (PHT): 2.51 cm    SHUNTS MV Decel Time: 302 msec    Systemic VTI:  0.22 m MV E velocity: 47.70 cm/s  Systemic Diam: 1.90 cm MV A velocity: 85.40 cm/s MV E/A ratio:  0.56  Oneil Parchment MD Electronically signed by Oneil Parchment MD Signature Date/Time: 04/05/2024/4:37:57 PM    Final   TEE  ECHO TEE 04/08/2024  Narrative TRANSESOPHOGEAL ECHO REPORT    Patient Name:   IZADORA ROEHR Date of Exam: 04/08/2024 Medical Rec #:  [id number]      Height:       67.0 in Accession #:    [id number]     Weight:       205.9 lb Date of Birth:  [birth year]      BSA:          2.047 m Patient Age:    67 years       BP:           117/68 mmHg Patient Gender: F  HR:           87 bpm. Exam Location:  Inpatient  Procedure: Transesophageal Echo, Cardiac Doppler and Color Doppler (Both Spectral and Color Flow Doppler were utilized during procedure).  Indications:     Stroke  History:         Patient has prior history of Echocardiogram examinations, most recent 04/05/2024. CAD; Risk Factors:Hypertension and Dyslipidemia.  Sonographer:     Ellouise Mose RDCS Referring  Phys:  [id number] ZANE ADAMS Diagnosing Phys: Toribio Fuel MD  PROCEDURE: After discussion of the risks and benefits of a TEE, an informed consent was obtained from the patient. The transesophogeal probe was passed without difficulty through the esophogus of the patient. Imaged were obtained with the patient in a left lateral decubitus position. Sedation performed by different physician. The patient was monitored while under deep sedation. Anesthestetic sedation was provided intravenously by Anesthesiology: 100mg  of Propofol , 372mg  of Lidocaine . The patient's vital signs; including heart rate, blood pressure, and oxygen saturation; remained stable throughout the procedure. The patient developed no complications during the procedure.  IMPRESSIONS   1. Left ventricular ejection fraction, by estimation, is >75%. The left ventricle has hyperdynamic function. The left ventricle has no regional wall motion abnormalities. There is severe concentric left ventricular hypertrophy. 2. Right ventricular systolic function is normal. The right ventricular size is normal. Mildly increased right ventricular wall thickness. 3. Left atrial size was mildly dilated. No left atrial/left atrial appendage thrombus was detected. 4. The mitral valve is normal in structure. Trivial mitral valve regurgitation. No evidence of mitral stenosis. 5. The aortic valve is tricuspid. Aortic valve regurgitation is trivial. No aortic stenosis is present. 6. There is mild (Grade II) plaque involving the descending aorta. 7. The inferior vena cava is normal in size with greater than 50% respiratory variability, suggesting right atrial pressure of 3 mmHg. 8. Agitated saline contrast bubble study was negative, with no evidence of any interatrial shunt.  Conclusion(s)/Recommendation(s): Normal biventricular function without evidence of hemodynamically significant valvular heart disease. No LA/LAA thrombus identified. Negative bubble study  for interatrial shunt. No intracardiac source of embolism detected on this on this transesophageal echocardiogram.  FINDINGS Left Ventricle: Left ventricular ejection fraction, by estimation, is >75%. The left ventricle has hyperdynamic function. The left ventricle has no regional wall motion abnormalities. The left ventricular internal cavity size was normal in size. There is severe concentric left ventricular hypertrophy.  Right Ventricle: The right ventricular size is normal. Mildly increased right ventricular wall thickness. Right ventricular systolic function is normal.  Left Atrium: Left atrial size was mildly dilated. No left atrial/left atrial appendage thrombus was detected.  Right Atrium: Right atrial size was normal in size.  Pericardium: There is no evidence of pericardial effusion.  Mitral Valve: The mitral valve is normal in structure. Trivial mitral valve regurgitation. No evidence of mitral valve stenosis.  Tricuspid Valve: The tricuspid valve is normal in structure. Tricuspid valve regurgitation is trivial. No evidence of tricuspid stenosis.  Aortic Valve: The aortic valve is tricuspid. Aortic valve regurgitation is trivial. No aortic stenosis is present.  Pulmonic Valve: The pulmonic valve was normal in structure. Pulmonic valve regurgitation is trivial. No evidence of pulmonic stenosis.  Aorta: The aortic root is normal in size and structure. There is mild (Grade II) plaque involving the descending aorta.  Venous: The inferior vena cava is normal in size with greater than 50% respiratory variability, suggesting right atrial pressure of 3 mmHg.  IAS/Shunts: No atrial level shunt  detected by color flow Doppler. Agitated saline contrast bubble study was negative, with no evidence of any interatrial shunt.  Additional Comments: Spectral Doppler performed.  Toribio Fuel MD Electronically signed by Toribio Fuel MD Signature Date/Time: 04/08/2024/3:30:30  PM    Final        ______________________________________________________________________________________________      Current Reported Medications:.    Current Meds  Medication Sig   aspirin EC 81 MG tablet Take 1 tablet (81 mg total) by mouth daily. Swallow whole.   ezetimibe (ZETIA) 10 MG tablet Take 1 tablet by mouth daily.   fluticasone  (FLONASE ) 50 MCG/ACT nasal spray Place 2 sprays into both nostrils daily.   methocarbamol (ROBAXIN) 500 MG tablet Take 500 mg by mouth as needed for muscle spasms.   metoprolol  tartrate (LOPRESSOR ) 50 MG tablet Take 50 mg by mouth 2 (two) times daily.   ondansetron  (ZOFRAN -ODT) 4 MG disintegrating tablet Take 1 tablet (4 mg total) by mouth every 6 (six) hours as needed for nausea or vomiting.   rosuvastatin (CRESTOR) 10 MG tablet Take 1 tablet (10 mg total) by mouth daily.   [DISCONTINUED] Misc Natural Products (PUMPKIN SEED OIL PO) Take 1 tablet by mouth 1 day or 1 dose.   Physical Exam:    VS:  BP 118/70   Pulse 62   Ht 5' 7 (1.702 m)   Wt 186 lb (84.4 kg)   LMP 11/28/2011   SpO2 98%   BMI 29.13 kg/m    Wt Readings from Last 3 Encounters:  05/17/24 186 lb (84.4 kg)  05/11/24 182 lb 3.2 oz (82.6 kg)  04/06/24 205 lb 14.6 oz (93.4 kg)    GEN: Well nourished, well developed in no acute distress NECK: No JVD; No carotid bruits CARDIAC: RRR, no murmurs, rubs, gallops RESPIRATORY:  Clear to auscultation without rales, wheezing or rhonchi  ABDOMEN: Soft, non-tender, non-distended EXTREMITIES:  No edema; No acute deformity     Asessement and Plan:.    Recent CVA: Patient recently admitted twice in 03/2024 for acute CVA. TEE showed LVEF >75% with severe LVH, normal RV function and no significant valvular disease. There was no evidence of left atrial/ left atrial appendage thrombus or interatrial shunt. Event Monitor results not yet available, patient reports that she will be returning her monitor today.  She reports improvement in her  right hand numbness, has been following with neurology.  She denies any palpitations or feeling of irregular heartbeats. Completed 4 weeks of DAPT with Aspirin and Brilinta and now on Aspirin alone.  Continue Crestor/ Zetia.  Nonobstructive CAD: Noted on remote cardiac catheterization in 2006. Stable with no anginal symptoms. No indication for ischemic evaluation.  Heart healthy diet and regular cardiovascular exercise encouraged.  Reviewed ED precautions. Continue Aspirin and Crestor/ Zetia.   Severe LVH: Recent TEE in 03/2024 showed LVEF of >75% and severe LVH.  Patient denies any chest pain, shortness of breath or lower extremity edema.  She denies any palpitations or feeling of irregular heartbeats.  Recommended proceeding with cardiac MRI given evidence of severe LVH, patient in agreement.  She does note that her mother has a history of congestive heart failure, she is unsure if she has a history of hypertrophic cardiomyopathy.  Patient will have H&H checked prior to cardiac MRI.  Hypertension: Blood pressure today 118/70.  Patient reports that her blood pressure has been very well-controlled at home.  Continue metoprolol .  Hyperlipidemia: Lipid panel in 03/2024 at time of first stroke: Total Cholesterol 189, Triglycerides  159, HDL 34, LDL 123. LDL goal <55. Currently on Crestor 10mg  daily and Zetia 10mg  daily.  She will follow-up with her PCP for ongoing monitoring and management.   Disposition: F/u with Dr. Floretta in two months to establish care.   Signed, Climmie Cronce D Lalita Ebel, NP

## 2024-05-17 ENCOUNTER — Encounter: Payer: Self-pay | Admitting: Cardiology

## 2024-05-17 ENCOUNTER — Ambulatory Visit: Attending: Cardiology | Admitting: Cardiology

## 2024-05-17 ENCOUNTER — Ambulatory Visit: Admitting: Student

## 2024-05-17 VITALS — BP 118/70 | HR 62 | Ht 67.0 in | Wt 186.0 lb

## 2024-05-17 DIAGNOSIS — I639 Cerebral infarction, unspecified: Secondary | ICD-10-CM

## 2024-05-17 DIAGNOSIS — I2510 Atherosclerotic heart disease of native coronary artery without angina pectoris: Secondary | ICD-10-CM

## 2024-05-17 DIAGNOSIS — I10 Essential (primary) hypertension: Secondary | ICD-10-CM

## 2024-05-17 DIAGNOSIS — E782 Mixed hyperlipidemia: Secondary | ICD-10-CM

## 2024-05-17 DIAGNOSIS — I517 Cardiomegaly: Secondary | ICD-10-CM | POA: Diagnosis not present

## 2024-05-17 NOTE — Patient Instructions (Signed)
 Medication Instructions:  Your physician recommends that you continue on your current medications as directed. Please refer to the Current Medication list given to you today.  *If you need a refill on your cardiac medications before your next appointment, please call your pharmacy*  Lab Work: Today: CBC If you have labs (blood work) drawn today and your tests are completely normal, you will receive your results only by: MyChart Message (if you have MyChart) OR A paper copy in the mail If you have any lab test that is abnormal or we need to change your treatment, we will call you to review the results.  Testing/Procedures: Cardiac MRI Your physician has requested that you have a cardiac MRI. Cardiac MRI uses a computer to create images of your heart as its beating, producing both still and moving pictures of your heart and major blood vessels. For further information please visit [email or web address]. Please follow the instruction sheet given to you today for more information.   Follow-Up: At [facility], you and your health needs are our priority.  As part of our continuing mission to provide you with exceptional heart care, our providers are all part of one team.  This team includes your primary Cardiologist (physician) and Advanced Practice Providers or APPs (Physician Assistants and Nurse Practitioners) who all work together to provide you with the care you need, when you need it.  Your next appointment:   2 month(s)  Provider:   Dr. Georganna Archer   We recommend signing up for the patient portal called MyChart.  Sign up information is provided on this After Visit Summary.  MyChart is used to connect with patients for Virtual Visits (Telemedicine).  Patients are able to view lab/test results, encounter notes, upcoming appointments, etc.  Non-urgent messages can be sent to your provider as well.   To learn more about what you can do with MyChart, go to [email or web address].    Other Instructions None

## 2024-05-18 ENCOUNTER — Ambulatory Visit: Attending: Cardiology

## 2024-05-18 ENCOUNTER — Encounter: Admitting: Occupational Therapy

## 2024-05-18 ENCOUNTER — Ambulatory Visit: Payer: Self-pay | Admitting: Cardiology

## 2024-05-18 ENCOUNTER — Ambulatory Visit

## 2024-05-18 DIAGNOSIS — I639 Cerebral infarction, unspecified: Secondary | ICD-10-CM

## 2024-05-18 LAB — CBC
Hematocrit: 43.8 % (ref 34.0–46.6)
Hemoglobin: 14 g/dL (ref 11.1–15.9)
MCH: 28.2 pg (ref 26.6–33.0)
MCHC: 32 g/dL (ref 31.5–35.7)
MCV: 88 fL (ref 79–97)
Platelets: 324 x10E3/uL (ref 150–450)
RBC: 4.97 x10E6/uL (ref 3.77–5.28)
RDW: 11.9 % (ref 11.7–15.4)
WBC: 6.7 x10E3/uL (ref 3.4–10.8)

## 2024-05-20 ENCOUNTER — Ambulatory Visit: Admitting: Physical Therapy

## 2024-05-20 ENCOUNTER — Encounter

## 2024-05-24 NOTE — Progress Notes (Signed)
 I agree with the above plan

## 2024-05-25 ENCOUNTER — Ambulatory Visit

## 2024-05-25 ENCOUNTER — Encounter: Admitting: Occupational Therapy

## 2024-05-25 ENCOUNTER — Ambulatory Visit: Payer: Self-pay | Admitting: Cardiology

## 2024-05-25 ENCOUNTER — Encounter (HOSPITAL_COMMUNITY): Payer: Self-pay

## 2024-05-25 DIAGNOSIS — I639 Cerebral infarction, unspecified: Secondary | ICD-10-CM | POA: Diagnosis not present

## 2024-05-25 NOTE — Progress Notes (Signed)
 Called and spoke with patient regarding her recent HEART MONITOR results. Patient verbalized understanding.

## 2024-05-28 ENCOUNTER — Other Ambulatory Visit: Payer: Self-pay | Admitting: Cardiology

## 2024-05-28 ENCOUNTER — Ambulatory Visit (HOSPITAL_COMMUNITY)
Admission: RE | Admit: 2024-05-28 | Discharge: 2024-05-28 | Disposition: A | Discharge status: Home / Self Care | Source: Ambulatory Visit | Attending: Cardiology | Admitting: Cardiology

## 2024-05-28 DIAGNOSIS — I639 Cerebral infarction, unspecified: Secondary | ICD-10-CM

## 2024-05-28 DIAGNOSIS — I10 Essential (primary) hypertension: Secondary | ICD-10-CM

## 2024-05-28 DIAGNOSIS — E782 Mixed hyperlipidemia: Secondary | ICD-10-CM | POA: Insufficient documentation

## 2024-05-28 DIAGNOSIS — I517 Cardiomegaly: Secondary | ICD-10-CM

## 2024-05-28 DIAGNOSIS — I2510 Atherosclerotic heart disease of native coronary artery without angina pectoris: Secondary | ICD-10-CM

## 2024-05-28 MED ORDER — GADOBUTROL 1 MMOL/ML IV SOLN
10.0000 mL | Freq: Once | INTRAVENOUS | Status: AC | PRN
  Administered 2024-05-28: 10 mL via INTRAVENOUS

## 2024-06-01 ENCOUNTER — Encounter

## 2024-06-01 ENCOUNTER — Ambulatory Visit: Admitting: Physical Therapy

## 2024-06-03 ENCOUNTER — Encounter

## 2024-06-03 ENCOUNTER — Ambulatory Visit

## 2024-06-07 NOTE — Telephone Encounter (Signed)
 Patient read message sent via MyChart from Katlyn West, NP 06/07/24

## 2024-06-08 ENCOUNTER — Ambulatory Visit: Admitting: Physical Therapy

## 2024-06-08 ENCOUNTER — Encounter: Admitting: Occupational Therapy

## 2024-06-10 ENCOUNTER — Encounter: Admitting: Occupational Therapy

## 2024-06-10 ENCOUNTER — Ambulatory Visit

## 2024-06-15 ENCOUNTER — Ambulatory Visit

## 2024-06-15 ENCOUNTER — Encounter: Admitting: Occupational Therapy

## 2024-06-17 ENCOUNTER — Ambulatory Visit

## 2024-06-17 ENCOUNTER — Encounter: Admitting: Occupational Therapy

## 2024-06-21 ENCOUNTER — Encounter: Admitting: Occupational Therapy

## 2024-06-21 ENCOUNTER — Ambulatory Visit

## 2024-06-23 ENCOUNTER — Encounter: Admitting: Occupational Therapy

## 2024-06-28 ENCOUNTER — Ambulatory Visit

## 2024-06-28 ENCOUNTER — Encounter: Admitting: Occupational Therapy

## 2024-06-30 ENCOUNTER — Encounter: Admitting: Occupational Therapy

## 2024-06-30 ENCOUNTER — Ambulatory Visit: Admitting: Physical Therapy

## 2024-07-15 NOTE — Progress Notes (Signed)
 " Cardiology Office Note:  .   Date:  07/15/2024  ID:  Barbara Kaiser, DOB 1955/10/10, MRN [id number] PCP: Bertrum Charlie CROME, MD  [facility] Providers Cardiologist:  Georganna Archer, MD { Chief Complaint:  Chief Complaint  Patient presents with   Hypertension    History of Present Illness: .    Barbara Kaiser is a 69 y.o. female with a PMH of nonobstructive CAD, HTN, HLD, prior CVA x2 who presents for follow-up.   Discussed the use of AI scribe software for clinical note transcription with the patient, who gave verbal consent to proceed.  History of Present Illness Barbara Kaiser is a 69 year old female with a history of cerebrovascular accident who presents for follow-up of heart monitor results and recent cardiac MRI.  She has a history of cerebrovascular accident and several transient ischemic attacks in the past with no residual neurological deficits. She is concerned about blood pressure fluctuations, noting variability with a recent reading of 120/60 mmHg before bed. Her current medications include metoprolol  50 mg, triamterene hydrochlorothiazide, ezetimibe , Crestor , and aspirin .  Her heart monitor was negative for atrial fibrillation. A transesophageal echocardiogram showed severe left ventricular hypertrophy, while a cardiac MRI indicated mild left ventricular hypertrophy and scarring consistent with hypertensive heart disease. She has a history of high blood pressure, which she believes contributed to her stroke.  No palpitations, heart fluttering, chest pain, or shortness of breath. 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 She mentions consuming foods high in salt, such as bread and cup noodles, which may affect her blood pressure.    Studies Reviewed: SABRA    EKG: No new ECG       Cardiac Studies & Procedures   ______________________________________________________________________________________________      ECHOCARDIOGRAM  ECHOCARDIOGRAM COMPLETE 04/05/2024  Narrative ECHOCARDIOGRAM REPORT    Patient Name:   Barbara Kaiser Date of Exam: 04/05/2024 Medical Rec #:  [id number]      Height:       67.0 in Accession #:    [id number]     Weight:       205.9 lb Date of Birth:  Nov 06, 1955      BSA:          2.047 m Patient Age:    67 years       BP:           159/81 mmHg Patient Gender: F              HR:           73 bpm. Exam Location:  Inpatient  Procedure: 2D Echo, Cardiac Doppler and Color Doppler (Both Spectral and Color Flow Doppler were utilized during procedure).  Indications:    TIA G45.9  History:        Patient has no prior history of Echocardiogram examinations. Risk Factors:Hypertension.  Sonographer:    Jayson Gaskins Referring Phys: [id number] JUSTIN B HOWERTER  IMPRESSIONS   1. Left ventricular ejection fraction, by estimation, is 65 to 70%. The left ventricle has normal function. The left ventricle has no regional wall motion abnormalities. There is mild left ventricular hypertrophy. Left ventricular diastolic parameters are consistent with Grade I diastolic dysfunction (impaired relaxation). 2. Right ventricular systolic function is normal. The right ventricular size is normal. 3. The mitral valve is normal in structure. No evidence of mitral valve regurgitation. No evidence of mitral stenosis. 4. The aortic valve is tricuspid. Aortic valve regurgitation is not visualized.  No aortic stenosis is present. 5. The inferior vena cava is normal in size with greater than 50% respiratory variability, suggesting right atrial pressure of 3 mmHg.  Conclusion(s)/Recommendation(s): No intracardiac source of embolism detected on this transthoracic study. Consider a transesophageal echocardiogram to exclude cardiac source of embolism if clinically indicated.  FINDINGS Left Ventricle: Left ventricular ejection fraction, by estimation, is 65 to 70%. The left ventricle has normal function.  The left ventricle has no regional wall motion abnormalities. The left ventricular internal cavity size was normal in size. There is mild left ventricular hypertrophy. Left ventricular diastolic parameters are consistent with Grade I diastolic dysfunction (impaired relaxation).  Right Ventricle: The right ventricular size is normal. No increase in right ventricular wall thickness. Right ventricular systolic function is normal.  Left Atrium: Left atrial size was normal in size.  Right Atrium: Right atrial size was normal in size.  Pericardium: There is no evidence of pericardial effusion.  Mitral Valve: The mitral valve is normal in structure. No evidence of mitral valve regurgitation. No evidence of mitral valve stenosis.  Tricuspid Valve: The tricuspid valve is normal in structure. Tricuspid valve regurgitation is not demonstrated. No evidence of tricuspid stenosis.  Aortic Valve: The aortic valve is tricuspid. Aortic valve regurgitation is not visualized. No aortic stenosis is present. Aortic valve mean gradient measures 5.0 mmHg. Aortic valve peak gradient measures 7.4 mmHg. Aortic valve area, by VTI measures 3.12 cm.  Pulmonic Valve: The pulmonic valve was normal in structure. Pulmonic valve regurgitation is not visualized. No evidence of pulmonic stenosis.  Aorta: The aortic root is normal in size and structure.  Venous: The inferior vena cava is normal in size with greater than 50% respiratory variability, suggesting right atrial pressure of 3 mmHg.  IAS/Shunts: No atrial level shunt detected by color flow Doppler.   LEFT VENTRICLE PLAX 2D LVIDd:         3.60 cm   Diastology LVIDs:         2.20 cm   LV e' medial:    5.00 cm/s LV PW:         1.10 cm   LV E/e' medial:  9.5 LV IVS:        1.20 cm   LV e' lateral:   8.05 cm/s LVOT diam:     1.90 cm   LV E/e' lateral: 5.9 LV SV:         63 LV SV Index:   31 LVOT Area:     2.84 cm   RIGHT VENTRICLE RV S prime:     16.50  cm/s TAPSE (M-mode): 2.5 cm  LEFT ATRIUM             Index        RIGHT ATRIUM           Index LA Vol (A2C):   33.7 ml 16.46 ml/m  RA Area:     18.40 cm LA Vol (A4C):   40.0 ml 19.54 ml/m  RA Volume:   50.50 ml  24.67 ml/m LA Biplane Vol: 40.3 ml 19.68 ml/m AORTIC VALVE AV Area (Vmax):    2.56 cm AV Area (Vmean):   2.43 cm AV Area (VTI):     3.12 cm AV Vmax:           136.00 cm/s AV Vmean:          102.000 cm/s AV VTI:            0.201 m AV Peak Grad:  7.4 mmHg AV Mean Grad:      5.0 mmHg LVOT Vmax:         123.00 cm/s LVOT Vmean:        87.300 cm/s LVOT VTI:          0.221 m LVOT/AV VTI ratio: 1.10  AORTA Ao Root diam: 2.80 cm  MITRAL VALVE MV Area (PHT): 2.51 cm    SHUNTS MV Decel Time: 302 msec    Systemic VTI:  0.22 m MV E velocity: 47.70 cm/s  Systemic Diam: 1.90 cm MV A velocity: 85.40 cm/s MV E/A ratio:  0.56  Oneil Parchment MD Electronically signed by Oneil Parchment MD Signature Date/Time: 04/05/2024/4:37:57 PM    Final   TEE  ECHO TEE 04/08/2024  Narrative TRANSESOPHOGEAL ECHO REPORT    Patient Name:   Barbara Kaiser Date of Exam: 04/08/2024 Medical Rec #:  [id number]      Height:       67.0 in Accession #:    [id number]     Weight:       205.9 lb Date of Birth:  [birth year]      BSA:          2.047 m Patient Age:    67 years       BP:           117/68 mmHg Patient Gender: F              HR:           87 bpm. Exam Location:  Inpatient  Procedure: Transesophageal Echo, Cardiac Doppler and Color Doppler (Both Spectral and Color Flow Doppler were utilized during procedure).  Indications:     Stroke  History:         Patient has prior history of Echocardiogram examinations, most recent 04/05/2024. CAD; Risk Factors:Hypertension and Dyslipidemia.  Sonographer:     Ellouise Mose RDCS Referring Phys:  [id number] ZANE ADAMS Diagnosing Phys: Toribio Fuel MD  PROCEDURE: After discussion of the risks and benefits of a TEE, an informed consent was  obtained from the patient. The transesophogeal probe was passed without difficulty through the esophogus of the patient. Imaged were obtained with the patient in a left lateral decubitus position. Sedation performed by different physician. The patient was monitored while under deep sedation. Anesthestetic sedation was provided intravenously by Anesthesiology: 100mg  of Propofol , 372mg  of Lidocaine . The patient's vital signs; including heart rate, blood pressure, and oxygen saturation; remained stable throughout the procedure. The patient developed no complications during the procedure.  IMPRESSIONS   1. Left ventricular ejection fraction, by estimation, is >75%. The left ventricle has hyperdynamic function. The left ventricle has no regional wall motion abnormalities. There is severe concentric left ventricular hypertrophy. 2. Right ventricular systolic function is normal. The right ventricular size is normal. Mildly increased right ventricular wall thickness. 3. Left atrial size was mildly dilated. No left atrial/left atrial appendage thrombus was detected. 4. The mitral valve is normal in structure. Trivial mitral valve regurgitation. No evidence of mitral stenosis. 5. The aortic valve is tricuspid. Aortic valve regurgitation is trivial. No aortic stenosis is present. 6. There is mild (Grade II) plaque involving the descending aorta. 7. The inferior vena cava is normal in size with greater than 50% respiratory variability, suggesting right atrial pressure of 3 mmHg. 8. Agitated saline contrast bubble study was negative, with no evidence of any interatrial shunt.  Conclusion(s)/Recommendation(s): Normal biventricular function without evidence of hemodynamically significant valvular heart disease. No LA/LAA thrombus  identified. Negative bubble study for interatrial shunt. No intracardiac source of embolism detected on this on this transesophageal echocardiogram.  FINDINGS Left Ventricle: Left  ventricular ejection fraction, by estimation, is >75%. The left ventricle has hyperdynamic function. The left ventricle has no regional wall motion abnormalities. The left ventricular internal cavity size was normal in size. There is severe concentric left ventricular hypertrophy.  Right Ventricle: The right ventricular size is normal. Mildly increased right ventricular wall thickness. Right ventricular systolic function is normal.  Left Atrium: Left atrial size was mildly dilated. No left atrial/left atrial appendage thrombus was detected.  Right Atrium: Right atrial size was normal in size.  Pericardium: There is no evidence of pericardial effusion.  Mitral Valve: The mitral valve is normal in structure. Trivial mitral valve regurgitation. No evidence of mitral valve stenosis.  Tricuspid Valve: The tricuspid valve is normal in structure. Tricuspid valve regurgitation is trivial. No evidence of tricuspid stenosis.  Aortic Valve: The aortic valve is tricuspid. Aortic valve regurgitation is trivial. No aortic stenosis is present.  Pulmonic Valve: The pulmonic valve was normal in structure. Pulmonic valve regurgitation is trivial. No evidence of pulmonic stenosis.  Aorta: The aortic root is normal in size and structure. There is mild (Grade II) plaque involving the descending aorta.  Venous: The inferior vena cava is normal in size with greater than 50% respiratory variability, suggesting right atrial pressure of 3 mmHg.  IAS/Shunts: No atrial level shunt detected by color flow Doppler. Agitated saline contrast bubble study was negative, with no evidence of any interatrial shunt.  Additional Comments: Spectral Doppler performed.  Toribio Fuel MD Electronically signed by Toribio Fuel MD Signature Date/Time: 04/08/2024/3:30:30 PM    Final  MONITORS  CARDIAC EVENT MONITOR 05/18/2024  Narrative Event monitor 9 days 04/14/2024 - 04/12/2024: Dominant rhythm: Sinus. HR 50-115  bpm. Avg HR 64 bpm. No PAC/PVC/atrial fibrillation/atrial flutter/SVT/VT/high grade AV block, sinus pause >3sec noted. 0 patient triggered events.     CARDIAC MRI  MR CARDIAC MORPHOLOGY W WO CONTRAST 05/28/2024  Narrative CLINICAL DATA:  45F with nonobstructive CAD, CVA, HTN, HLD. Severe LVH on echo  EXAM: CARDIAC MRI  TECHNIQUE: The patient was scanned on a 1.5 Tesla Siemens magnet. A dedicated cardiac coil was used. Functional imaging was done using Fiesta sequences. 2,3, and 4 chamber views were done to assess for RWMA's. Modified Simpson's rule using a short axis stack was used to calculate an ejection fraction on a dedicated work [employer]. The patient received 10 cc of Gadavist . After 10 minutes inversion recovery sequences were used to assess for infiltration and scar tissue. Phase contrast velocity mapping was performed above the aortic and pulmonic valves  CONTRAST:  10 cc  of Gadavist   FINDINGS: Left ventricle:  -Asymmetric hypertrophy measuring 14mm in septum (8mm in posterior wall), not meeting criteria for hypertrophic cardiomyopathy (<2mm)  -Normal size  -Normal systolic function  -Normal ECV (25%)  -Basal septal midwall LGE  -RV insertion site LGE  LV EF:  63% (Normal 52-79%)  Absolute volumes:  LV EDV: (Normal 78-167 mL)  LV ESV: 55mL (Normal 21-64 mL)  LV SV: 95mL (Normal 52-114 mL)  CO: 5.7L/min (Normal 2.7-6.3 L/min)  Indexed volumes:  LV EDV: 90mL/sq-m (Normal 50-96 mL/sq-m)  LV ESV: 21mL/sq-m (Normal 10-40 mL/sq-m)  LV SV: 47mL/sq-m (Normal 33-64 mL/sq-m)  CI: 2.9L/min/sq-m (Normal 1.9-3.9 L/min/sq-m)  Right ventricle: Normal size and systolic function  RV EF: 76% (Normal 52-80%)  Absolute volumes:  RV EDV: (  Normal 79-175 mL)  RV ESV: 28mL (Normal 13-75 mL)  RV SV: 91mL (Normal 56-110 mL)  CO: 5.5L/min (Normal 2.7-6 L/min)  Indexed volumes:  RV EDV: 59mL/sq-m (Normal 51-97  mL/sq-m)  RV ESV: 47mL/sq-m (Normal 9-42 mL/sq-m)  RV SV: 60mL/sq-m (Normal 35-61 mL/sq-m)  CI: 2.8L/min/sq-m (Normal 1.8-3.8 L/min/sq-m)  Left atrium: Moderate enlargement  Right atrium: Normal size  Mitral valve: Mild regurgitation  Aortic valve: No regurgitation  Tricuspid valve: Mild regurgitation  Pulmonic valve: No regurgitation  Aorta: Normal proximal ascending aorta  Pericardium: Normal  IMPRESSION: 1. Asymmetric LV hypertrophy measuring 14mm in septum (8mm in posterior wall), not meeting criteria for hypertrophic cardiomyopathy (<44mm). Also with basal septal midwall LGE, which is a nonischemic scar pattern, and RV insertion site LGE, which is a nonspecific scar pattern often seen in setting of elevated pulmonary pressures. While this could represent a mild form of HCM, suspect more likely hypertensive heart disease with focal areas of fibrosis  2.  Normal LV size and systolic function (EF 63%)  3.  Normal RV size and systolic function (EF 76%)   Electronically Signed By: Lonni Nanas M.D. On: 05/31/2024 16:33   ______________________________________________________________________________________________      Results Radiology Cardiac MRI: Mild left ventricular hypertrophy and myocardial scar pattern consistent with hypertensive heart disease  Diagnostic Heart monitor: No atrial fibrillation detected  Risk Assessment/Calculations:          Physical Exam:    VS:  BP (!) 160/78 (BP Location: Left Arm, Patient Position: Sitting, Cuff Size: Normal)   Pulse 63   Ht 5' 7 (1.702 m)   Wt 185 lb 1.6 oz (84 kg)   LMP 11/28/2011   SpO2 99%   BMI 28.99 kg/m      Wt Readings from Last 3 Encounters:  05/17/24 186 lb (84.4 kg)  05/11/24 182 lb 3.2 oz (82.6 kg)  04/06/24 205 lb 14.6 oz (93.4 kg)     GEN: Well nourished, well developed, in no acute distress NECK: No JVD; No carotid bruits, carotid pulses are noticeably bounding CARDIAC:  RRR, no murmurs, rubs, gallops RESPIRATORY:  Clear to auscultation without rales, wheezing or rhonchi  ABDOMEN: Soft, non-tender, non-distended, normal bowel sounds EXTREMITIES:  Warm and well perfused, no edema; No deformity, 2+ radial pulses PSYCH: Normal mood and affect   ASSESSMENT AND PLAN: .    #HTN - BP is markedly elevated today and based on her cardiac MRI findings I suspect that her blood pressure is typically poorly controlled. - I will plan to stop her Dyazide and metoprolol  and instead switch her to losartan  and amlodipine . - I counseled the patient to check her blood pressures twice daily for the next 2 weeks and to provide me with a blood pressure log to make necessary adjustments as needed. - I further counseled the patient on adhering to a low-sodium diet and exercising. Stop Dyazide Stop metoprolol  tartrate Start losartan  50 mg daily Start amlodipine  5 mg daily 2-week blood pressure log Follow-up in 1 month  #HLD - Patient's LDL goal is <70 given her prior CVA. - Will check lipids today and lipoprotein a Checked lipid panel and LPA Continue rosuvastatin  10 mg daily for now Continue Zetia  10 mg daily  #Non-Obstructive CAD #Prior CVA Continue aspirin  81 mg daily        This note was written with the assistance of a dictation microphone or AI dictation software. Please excuse any typos or grammatical errors.   Signed, Georganna Archer, MD  07/15/2024 8:32 PM    [facility] "

## 2024-07-16 ENCOUNTER — Encounter: Payer: Self-pay | Admitting: Student in an Organized Health Care Education/Training Program

## 2024-07-16 ENCOUNTER — Ambulatory Visit
Attending: Student in an Organized Health Care Education/Training Program | Admitting: Student in an Organized Health Care Education/Training Program

## 2024-07-16 ENCOUNTER — Other Ambulatory Visit (HOSPITAL_COMMUNITY): Payer: Self-pay

## 2024-07-16 VITALS — BP 160/78 | HR 63 | Ht 67.0 in | Wt 185.1 lb

## 2024-07-16 DIAGNOSIS — E782 Mixed hyperlipidemia: Secondary | ICD-10-CM | POA: Diagnosis not present

## 2024-07-16 DIAGNOSIS — I10 Essential (primary) hypertension: Secondary | ICD-10-CM | POA: Diagnosis not present

## 2024-07-16 MED ORDER — AMLODIPINE BESYLATE 5 MG PO TABS
5.0000 mg | ORAL_TABLET | Freq: Every day | ORAL | 3 refills | Status: AC
  Filled 2024-07-16: qty 90, 90d supply, fill #0

## 2024-07-16 MED ORDER — LOSARTAN POTASSIUM 50 MG PO TABS
50.0000 mg | ORAL_TABLET | Freq: Every day | ORAL | 3 refills | Status: AC
  Filled 2024-07-16: qty 90, 90d supply, fill #0

## 2024-07-16 NOTE — Patient Instructions (Signed)
 Medication Instructions:  STOP Metoprolol  STOP Triamterene- hydrochlorothiazide   START Amlodipine  5 mg daily  START Losartan  50 mg daily  *If you need a refill on your cardiac medications before your next appointment, please call your pharmacy*  Lab Work: BMP LIPID PANE;  LP(a)  If you have labs (blood work) drawn today and your tests are completely normal, you will receive your results only by: MyChart Message (if you have MyChart) OR A paper copy in the mail If you have any lab test that is abnormal or we need to change your treatment, we will call you to review the results.  Check blood pressure twice daily for 2 weeks and contact office to advise results:  Blood Pressure Record Sheet To take your blood pressure, you will need a blood pressure machine. You can buy a blood pressure machine (blood pressure monitor) at your clinic, drug store, or online. When choosing one, consider: An automatic monitor that has an arm cuff. A cuff that wraps snugly around your upper arm. You should be able to fit only one finger between your arm and the cuff. A device that stores blood pressure reading results. Do not choose a monitor that measures your blood pressure from your wrist or finger. Follow your health care provider's instructions for how to take your blood pressure. To use this form: Take your blood pressure medications every day These measurements should be taken when you have been at rest for at least 10-15 min Take at least 2 readings with each blood pressure check. This makes sure the results are correct. Wait 1-2 minutes between measurements. Write down the results in the spaces on this form. Keep in mind it should always be recorded systolic over diastolic. Both numbers are important.  Repeat this every day for 2-3 weeks, or as told by your health care provider.  Make a follow-up appointment with your health care provider to discuss the results.  Blood Pressure Log Date  Medications taken? (Y/N) Blood Pressure Time of Day                                                                                                         Follow-Up: At [facility], you and your health needs are our priority.  As part of our continuing mission to provide you with exceptional heart care, our providers are all part of one team.  This team includes your primary Cardiologist (physician) and Advanced Practice Providers or APPs (Physician Assistants and Nurse Practitioners) who all work together to provide you with the care you need, when you need it.  Your next appointment:   1 month(s)  Provider:   Georganna Archer, MD

## 2024-07-19 ENCOUNTER — Ambulatory Visit: Payer: Self-pay | Admitting: Student in an Organized Health Care Education/Training Program

## 2024-07-19 ENCOUNTER — Encounter: Payer: Self-pay | Admitting: Student in an Organized Health Care Education/Training Program

## 2024-07-19 ENCOUNTER — Telehealth: Payer: Self-pay | Admitting: Student in an Organized Health Care Education/Training Program

## 2024-07-19 DIAGNOSIS — E7841 Elevated Lipoprotein(a): Secondary | ICD-10-CM | POA: Insufficient documentation

## 2024-07-19 LAB — BASIC METABOLIC PANEL WITH GFR
BUN/Creatinine Ratio: 18 (ref 12–28)
BUN: 13 mg/dL (ref 8–27)
CO2: 22 mmol/L (ref 20–29)
Calcium: 9.5 mg/dL (ref 8.7–10.3)
Chloride: 107 mmol/L — ABNORMAL HIGH (ref 96–106)
Creatinine, Ser: 0.74 mg/dL (ref 0.57–1.00)
Glucose: 100 mg/dL — ABNORMAL HIGH (ref 70–99)
Potassium: 4.6 mmol/L (ref 3.5–5.2)
Sodium: 142 mmol/L (ref 134–144)
eGFR: 89 mL/min/1.73

## 2024-07-19 LAB — LIPOPROTEIN A (LPA): Lipoprotein (a): 144.2 nmol/L — AB

## 2024-07-19 LAB — LIPID PANEL
Chol/HDL Ratio: 2.8 ratio (ref 0.0–4.4)
Cholesterol, Total: 105 mg/dL (ref 100–199)
HDL: 37 mg/dL — ABNORMAL LOW
LDL Chol Calc (NIH): 51 mg/dL (ref 0–99)
Triglycerides: 87 mg/dL (ref 0–149)
VLDL Cholesterol Cal: 17 mg/dL (ref 5–40)

## 2024-07-19 NOTE — Telephone Encounter (Signed)
 Spoke with pt. Pt states her BP and HR is going up and down. Feels better on this medication than the medication she was on before and doesn't want to change it. Pt denies having symptoms.  Is not taking bp readings 1.5/2 hours after taking medication. Advised her to do so. Advised low sodium diet and exercise as she spoke to Dr Floretta about. Pt will keep log of Bps this week and send them in. Advised pt to call back if onset of symptoms. 911/ED precautions given. Advised I would forward to Dr Floretta. Pt stated understanding.

## 2024-07-19 NOTE — Telephone Encounter (Signed)
 Pt c/o medication issue:  1. Name of Medication:  amLODipine  (NORVASC ) 5 MG tablet losartan  (COZAAR ) 50 MG tablet  2. How are you currently taking this medication (dosage and times per day)? As written  3. Are you having a reaction (difficulty breathing--STAT)? No  4. What is your medication issue? Pt would like a c/b regarding above medication making her BP and HR going up and down. Pt states that BP was 168/92 HR 102 (1/17) and 128/84 HR 87 (today).  Please advise

## 2024-08-04 ENCOUNTER — Ambulatory Visit: Payer: Self-pay | Admitting: Adult Health

## 2024-08-04 ENCOUNTER — Encounter: Payer: Self-pay | Admitting: Adult Health

## 2024-08-04 ENCOUNTER — Other Ambulatory Visit: Payer: Self-pay

## 2024-08-04 VITALS — BP 140/82 | HR 70 | Temp 95.9°F | Ht 67.0 in | Wt 182.0 lb

## 2024-08-04 DIAGNOSIS — J0140 Acute pansinusitis, unspecified: Secondary | ICD-10-CM

## 2024-08-04 MED ORDER — AMOXICILLIN-POT CLAVULANATE 875-125 MG PO TABS: 1.0000 | ORAL_TABLET | Freq: Two times a day (BID) | ORAL | 0 refills | Status: AC

## 2024-08-04 NOTE — Progress Notes (Signed)
 Therapist, [facility] [address] [place], [place] [address]   Office Visit Note  Patient Name: Barbara Kaiser Date of Birth [id number]  Medical Record number [id number]  Date of Service: 08/04/2024  Chief Complaint  Patient presents with   Acute Visit    Patient c/o pressure in her forehead which is worse on the R side and PND. Symptoms began about 2-3 weeks ago and have been persisting since then. She has not taken any OTC medications for current symptoms.      HPI Pt is here for a sick visit. She reports she started feeling off about 2 weeks ago.  She was sick over [place] and some of these symptoms lingered.  She reports PND, right side head pressure Denies sore throat, fever, chills.     Current Medication:  Outpatient Encounter Medications as of 08/04/2024  Medication Sig   amLODipine  (NORVASC ) 5 MG tablet Take 1 tablet (5 mg total) by mouth daily.   amoxicillin -clavulanate (AUGMENTIN ) 875-125 MG tablet Take 1 tablet by mouth 2 (two) times daily.   ASPIRIN  LOW DOSE 81 MG chewable tablet Chew 81 mg by mouth daily.   ezetimibe  (ZETIA ) 10 MG tablet Take 1 tablet by mouth daily.   fluticasone  (FLONASE ) 50 MCG/ACT nasal spray Place 2 sprays into both nostrils daily. (Patient taking differently: Place 2 sprays into both nostrils daily as needed.)   losartan  (COZAAR ) 50 MG tablet Take 1 tablet (50 mg total) by mouth daily.   methocarbamol  (ROBAXIN ) 500 MG tablet Take 500 mg by mouth as needed for muscle spasms.   rosuvastatin  (CRESTOR ) 10 MG tablet Take 1 tablet (10 mg total) by mouth daily.   [DISCONTINUED] ondansetron  (ZOFRAN -ODT) 4 MG disintegrating tablet Take 1 tablet (4 mg total) by mouth every 6 (six) hours as needed for nausea or vomiting. (Patient not taking: Reported on 07/16/2024)   [DISCONTINUED] telmisartan (MICARDIS) 20 MG tablet Take 20 mg by mouth daily.   No facility-administered encounter medications on file as of 08/04/2024.      Medical History: Past  Medical History:  Diagnosis Date   Allergic rhinitis    Anemia, iron deficiency    Colon polyps 3/10   Complication of anesthesia    hard to wake   Hypertension    PONV (postoperative nausea and vomiting)    Uterine fibroid    with heavy menses and anemia (resolved by ablation)     Vital Signs: BP (!) 140/82   Pulse 70   Temp (!) 95.9 F (35.5 C)   Ht 5' 7 (1.702 m)   Wt 182 lb (82.6 kg)   LMP 11/28/2011   BMI 28.51 kg/m    Review of Systems  Constitutional:  Negative for chills, fatigue and fever.  HENT:  Positive for congestion, postnasal drip, sinus pressure and sinus pain.   Eyes:  Negative for pain and itching.  Respiratory:  Negative for cough.   Cardiovascular:  Negative for chest pain.  Gastrointestinal:  Negative for diarrhea, nausea and vomiting.    Physical Exam Vitals reviewed.  Constitutional:      Appearance: Normal appearance.  HENT:     Head: Normocephalic.     Right Ear: Tympanic membrane and ear canal normal.     Left Ear: Tympanic membrane and ear canal normal.     Nose: Nose normal.     Mouth/Throat:     Mouth: Mucous membranes are moist.  Eyes:     Pupils: Pupils are equal, round, and reactive to  light.  Pulmonary:     Effort: Pulmonary effort is normal.  Lymphadenopathy:     Cervical: No cervical adenopathy.  Neurological:     Mental Status: She is alert.     Assessment/Plan: 1. Acute non-recurrent pansinusitis (Primary) Patient Instructions: -Take complete course of antibiotics as prescribed.  Take with food.  -Try Flonase /Fluticasone  nasal spray, 2 sprays to each nostril once a day. -You can try using a neti pot or nasal saline rinse product to help clear mucus congestion. -Rest and stay well hydrated (by drinking water and other liquids). Avoid/limit caffeine. -Take over-the-counter medicines (i.e. Mucinex, decongestant, Ibuprofen  or Tylenol , cough suppressant) to help relieve your symptoms. -For your cough, use cough  drops/throat lozenges, gargle warm salt water and/or drink warm liquids (like tea with honey). -Send my chart message to provider or schedule return visit as needed for new/worsening symptoms or if symptoms do not improve as discussed with antibiotic and other recommended treatment.   - amoxicillin -clavulanate (AUGMENTIN ) 875-125 MG tablet; Take 1 tablet by mouth 2 (two) times daily.  Dispense: 20 tablet; Refill: 0     General Counseling: Chrissy verbalizes understanding of the findings of todays visit and agrees with plan of treatment. I have discussed any further diagnostic evaluation that may be needed or ordered today. We also reviewed her medications today. she has been encouraged to call the office with any questions or concerns that should arise related to todays visit.   No orders of the defined types were placed in this encounter.   Meds ordered this encounter  Medications   amoxicillin -clavulanate (AUGMENTIN ) 875-125 MG tablet    Sig: Take 1 tablet by mouth 2 (two) times daily.    Dispense:  20 tablet    Refill:  0    Time spent:15 Minutes    Juliene DOROTHA Howells AGNP-C Nurse Practitioner

## 2024-08-26 ENCOUNTER — Ambulatory Visit: Admitting: Student in an Organized Health Care Education/Training Program
